# Patient Record
Sex: Female | Born: 1970 | Hispanic: Yes | Smoking: Never smoker
Health system: Southern US, Community
[De-identification: ages and names within clinical notes are randomized; demographics above are authoritative.]

## PROBLEM LIST (undated history)

## (undated) DIAGNOSIS — I10 Essential (primary) hypertension: Secondary | ICD-10-CM

## (undated) DIAGNOSIS — G4730 Sleep apnea, unspecified: Secondary | ICD-10-CM

## (undated) DIAGNOSIS — F419 Anxiety disorder, unspecified: Secondary | ICD-10-CM

## (undated) DIAGNOSIS — G43909 Migraine, unspecified, not intractable, without status migrainosus: Secondary | ICD-10-CM

## (undated) HISTORY — DX: Migraine, unspecified, not intractable, without status migrainosus: G43.909

## (undated) HISTORY — PX: TUBAL LIGATION: SHX77

---

## 2000-05-16 ENCOUNTER — Other Ambulatory Visit: Admission: RE | Admit: 2000-05-16 | Discharge: 2000-05-16 | Payer: Self-pay | Admitting: Obstetrics and Gynecology

## 2000-06-16 ENCOUNTER — Ambulatory Visit (HOSPITAL_COMMUNITY): Admission: RE | Admit: 2000-06-16 | Discharge: 2000-06-16 | Payer: Self-pay | Admitting: Obstetrics and Gynecology

## 2000-06-16 ENCOUNTER — Encounter: Payer: Self-pay | Admitting: Obstetrics and Gynecology

## 2000-11-09 ENCOUNTER — Inpatient Hospital Stay (HOSPITAL_COMMUNITY): Admission: AD | Admit: 2000-11-09 | Discharge: 2000-11-09 | Payer: Self-pay | Admitting: Obstetrics and Gynecology

## 2000-11-11 ENCOUNTER — Inpatient Hospital Stay (HOSPITAL_COMMUNITY): Admission: AD | Admit: 2000-11-11 | Discharge: 2000-11-13 | Payer: Self-pay | Admitting: Obstetrics and Gynecology

## 2001-12-24 ENCOUNTER — Other Ambulatory Visit: Admission: RE | Admit: 2001-12-24 | Discharge: 2001-12-24 | Payer: Self-pay | Admitting: Obstetrics and Gynecology

## 2002-02-08 ENCOUNTER — Encounter: Payer: Self-pay | Admitting: Obstetrics and Gynecology

## 2002-02-08 ENCOUNTER — Ambulatory Visit (HOSPITAL_COMMUNITY): Admission: RE | Admit: 2002-02-08 | Discharge: 2002-02-08 | Payer: Self-pay | Admitting: Obstetrics and Gynecology

## 2002-06-26 ENCOUNTER — Inpatient Hospital Stay (HOSPITAL_COMMUNITY): Admission: AD | Admit: 2002-06-26 | Discharge: 2002-06-26 | Payer: Self-pay | Admitting: Obstetrics and Gynecology

## 2002-07-03 ENCOUNTER — Inpatient Hospital Stay (HOSPITAL_COMMUNITY): Admission: AD | Admit: 2002-07-03 | Discharge: 2002-07-03 | Payer: Self-pay | Admitting: Obstetrics and Gynecology

## 2002-07-06 ENCOUNTER — Inpatient Hospital Stay (HOSPITAL_COMMUNITY): Admission: AD | Admit: 2002-07-06 | Discharge: 2002-07-07 | Payer: Self-pay | Admitting: Obstetrics and Gynecology

## 2006-06-19 ENCOUNTER — Emergency Department (HOSPITAL_COMMUNITY): Admission: EM | Admit: 2006-06-19 | Discharge: 2006-06-19 | Payer: Self-pay | Admitting: Emergency Medicine

## 2006-07-11 ENCOUNTER — Emergency Department (HOSPITAL_COMMUNITY): Admission: EM | Admit: 2006-07-11 | Discharge: 2006-07-11 | Payer: Self-pay | Admitting: Emergency Medicine

## 2007-05-18 ENCOUNTER — Emergency Department (HOSPITAL_COMMUNITY): Admission: EM | Admit: 2007-05-18 | Discharge: 2007-05-18 | Payer: Self-pay | Admitting: Emergency Medicine

## 2007-12-18 ENCOUNTER — Emergency Department (HOSPITAL_COMMUNITY): Admission: EM | Admit: 2007-12-18 | Discharge: 2007-12-18 | Payer: Self-pay | Admitting: Emergency Medicine

## 2008-05-08 ENCOUNTER — Emergency Department (HOSPITAL_COMMUNITY): Admission: EM | Admit: 2008-05-08 | Discharge: 2008-05-08 | Payer: Self-pay | Admitting: Emergency Medicine

## 2008-05-09 ENCOUNTER — Encounter (INDEPENDENT_AMBULATORY_CARE_PROVIDER_SITE_OTHER): Payer: Self-pay | Admitting: Emergency Medicine

## 2008-05-09 ENCOUNTER — Emergency Department (HOSPITAL_COMMUNITY): Admission: EM | Admit: 2008-05-09 | Discharge: 2008-05-09 | Payer: Self-pay | Admitting: Emergency Medicine

## 2008-05-19 ENCOUNTER — Emergency Department (HOSPITAL_COMMUNITY): Admission: EM | Admit: 2008-05-19 | Discharge: 2008-05-19 | Payer: Self-pay | Admitting: Emergency Medicine

## 2008-05-29 ENCOUNTER — Emergency Department (HOSPITAL_COMMUNITY): Admission: EM | Admit: 2008-05-29 | Discharge: 2008-05-29 | Payer: Self-pay | Admitting: Emergency Medicine

## 2008-12-21 ENCOUNTER — Inpatient Hospital Stay (HOSPITAL_COMMUNITY): Admission: AD | Admit: 2008-12-21 | Discharge: 2008-12-24 | Payer: Self-pay | Admitting: Psychiatry

## 2008-12-21 ENCOUNTER — Emergency Department (HOSPITAL_COMMUNITY): Admission: EM | Admit: 2008-12-21 | Discharge: 2008-12-21 | Payer: Self-pay | Admitting: Emergency Medicine

## 2008-12-21 ENCOUNTER — Ambulatory Visit: Payer: Self-pay | Admitting: Psychiatry

## 2009-01-14 ENCOUNTER — Emergency Department (HOSPITAL_COMMUNITY): Admission: EM | Admit: 2009-01-14 | Discharge: 2009-01-14 | Payer: Self-pay | Admitting: Emergency Medicine

## 2009-01-16 ENCOUNTER — Encounter: Admission: RE | Admit: 2009-01-16 | Discharge: 2009-01-16 | Payer: Self-pay | Admitting: Family Medicine

## 2009-07-23 ENCOUNTER — Emergency Department (HOSPITAL_COMMUNITY): Admission: EM | Admit: 2009-07-23 | Discharge: 2009-07-23 | Payer: Self-pay | Admitting: Emergency Medicine

## 2009-08-24 ENCOUNTER — Other Ambulatory Visit: Admission: RE | Admit: 2009-08-24 | Discharge: 2009-08-24 | Payer: Self-pay | Admitting: Obstetrics and Gynecology

## 2009-10-11 ENCOUNTER — Inpatient Hospital Stay (HOSPITAL_COMMUNITY): Admission: AD | Admit: 2009-10-11 | Discharge: 2009-10-14 | Payer: Self-pay | Admitting: Obstetrics and Gynecology

## 2009-10-11 ENCOUNTER — Ambulatory Visit: Payer: Self-pay | Admitting: Obstetrics & Gynecology

## 2010-01-03 HISTORY — PX: HERNIA REPAIR: SHX51

## 2010-01-25 LAB — CBC
MCH: 29.2 pg (ref 26.0–34.0)
MCV: 83.2 fL (ref 78.0–100.0)
Platelets: 276 10*3/uL (ref 150–400)
RBC: 3.7 MIL/uL — ABNORMAL LOW (ref 3.87–5.11)
RDW: 12.2 % (ref 11.5–15.5)

## 2010-01-25 LAB — RAPID URINE DRUG SCREEN, HOSP PERFORMED: Barbiturates: NOT DETECTED

## 2010-01-25 LAB — DIFFERENTIAL
Basophils Relative: 1 % (ref 0–1)
Eosinophils Absolute: 0.1 10*3/uL (ref 0.0–0.7)
Eosinophils Relative: 2 % (ref 0–5)
Lymphs Abs: 2.1 10*3/uL (ref 0.7–4.0)
Monocytes Relative: 6 % (ref 3–12)
Neutrophils Relative %: 58 % (ref 43–77)

## 2010-01-25 LAB — BASIC METABOLIC PANEL
CO2: 24 mEq/L (ref 19–32)
Chloride: 108 mEq/L (ref 96–112)
GFR calc Af Amer: >60 mL/min (ref >60)
Glucose, Bld: 125 mg/dL — ABNORMAL HIGH (ref 70–99)
Potassium: 3.8 mEq/L (ref 3.5–5.1)
Sodium: 141 mEq/L (ref 135–145)

## 2010-01-25 LAB — SURGICAL PCR SCREEN: MRSA, PCR: NEGATIVE

## 2010-01-26 ENCOUNTER — Ambulatory Visit (HOSPITAL_COMMUNITY)
Admission: RE | Admit: 2010-01-26 | Discharge: 2010-01-26 | Payer: Self-pay | Source: Home / Self Care | Attending: General Surgery | Admitting: General Surgery

## 2010-02-08 NOTE — Discharge Summary (Signed)
  NAMEJENIPHER, HAVEL               ACCOUNT NO.:  [id number]  MEDICAL RECORD NO.:  [id number]          PATIENT TYPE:  OIB  LOCATION:  A206                          FACILITY:  [facility]  PHYSICIAN:  Barbaraann Barthel, M.D. DATE OF BIRTH:  Jul 30, 1970  DATE OF ADMISSION:  01/26/2010 DATE OF DISCHARGE:  01/24/2012LH                              DISCHARGE SUMMARY   DIAGNOSIS:  Umbilical hernia.  PROCEDURE:  Umbilical hernia repair (without mesh).  NOTE:  This is a 40 year old Latin American female who was seen through the Outpatient Department and was operated upon for an umbilical hernia repair, this was uneventful.  This was taken care of without surgery. She was kept under observation postanesthesia wise, so she had no untoward symptoms.  At the time of discharge, her vital signs were stable.  Her wound was clean and dry.  She was voiding well and tolerating p.o.  She had no shortness of breath, leg pain, or any fever. We have made followup arrangements for discharge for her.     Barbaraann Barthel, M.D.     WB/MEDQ  D:  01/26/2010  T:  01/27/2010  Job:  [id number]  Electronically Signed by Barbaraann Barthel M.D. on 02/08/2010 10:53:41 AM

## 2010-02-08 NOTE — Op Note (Signed)
  Paula Mullins, Paula Mullins               ACCOUNT NO.:  [id number]  MEDICAL RECORD NO.:  [id number]          PATIENT TYPE:  OIB  LOCATION:  A206                          FACILITY:  [facility]  PHYSICIAN:  Barbaraann Barthel, M.D. DATE OF BIRTH:  12-Aug-1970  DATE OF PROCEDURE:  01/26/2010 DATE OF DISCHARGE:  01/26/2010                              OPERATIVE REPORT   PREOPERATIVE DIAGNOSIS:  Umbilical hernia.  POSTOPERATIVE DIAGNOSIS:  Umbilical hernia.  PROCEDURE:  Umbilical hernia repair (without mesh).  SPECIMEN:  None.  SURGEON:  Barbaraann Barthel, MD  WOUND CLASSIFICATION:  Clean.  NOTE:  This is a 41 year old Latin American female who is a multigravida who presented with an umbilical hernia.  We discussed elective repair with her and discussed complications not limited to, but including bleeding, infection, and recurrence.  We also discussed the possibility that I might need to use a mesh prosthesis.  This was discussed in detail in Spanish and an informed consent was obtained.  GROSS OPERATIVE FINDINGS:  The patient had defect approximately the size of a quarter.  I did not feel that this required any mesh, as there was a good fascial rim around the perimeter of this which allowed a closure without tension.  There was some incarcerated omentum which was returned to the intra-abdominal cavity and there was a very attenuated hernia sac, which was not sent as a specimen.  TECHNIQUE:  The patient was placed in the supine position.  After the adequate administration of general anesthesia via endotracheal intubation or LMA, the patient's abdomen was prepped with Betadine solution and draped in usual manner.  Prior to this, a Foley catheter was aseptically inserted.  A curvilinear incision was carried out over the inferior aspect of the umbilicus in order to obtain a good cosmetic result.  We dissected the hernia free from the umbilical skin carefully, so that this was not buttonholed.   We then obtained good fascial rim after returning the omentum to the abdominal cavity.  I closed this transversely using 0 Vicryl in a figure-of-eight fashion.  Required approximately four sutures to do this.  We then closed the subcutaneous layer over this and I anesthetized the area around the fascia with 0.5% Sensorcaine to help with postoperative comfort.  I then tacked the umbilicus skin to the fascia to return the umbilicus to its natural concave appearance.  I then closed the skin subcuticularly with a 5-0 Polysorb suture and quarter-inch Steri-Strips with bacitracin and a sterile dressing was applied.  Prior to closure, all sponge, needle, and instrument counts were found to be correct.  Estimated blood loss was minimal.  The patient received a liter of crystalloids intraoperatively.  No drains were placed and there were no complications.     Barbaraann Barthel, M.D.     WB/MEDQ  D:  01/26/2010  T:  01/27/2010  Job:  [id number]  cc:   Dr. Emelda Fear  Electronically Signed by Barbaraann Barthel M.D. on 02/08/2010 10:53:48 AM

## 2010-03-17 LAB — URINE MICROSCOPIC-ADD ON

## 2010-03-17 LAB — CBC
HCT: 18.6 % — ABNORMAL LOW (ref 36.0–46.0)
HCT: 25.5 % — ABNORMAL LOW (ref 36.0–46.0)
Hemoglobin: 10.2 g/dL — ABNORMAL LOW (ref 12.0–15.0)
Hemoglobin: 8.9 g/dL — ABNORMAL LOW (ref 12.0–15.0)
MCH: 33 pg (ref 26.0–34.0)
MCHC: 34.6 g/dL (ref 30.0–36.0)
MCV: 94.9 fL (ref 78.0–100.0)
Platelets: 105 10*3/uL — ABNORMAL LOW (ref 150–400)
RBC: 1.97 MIL/uL — ABNORMAL LOW (ref 3.87–5.11)
RBC: 2.69 MIL/uL — ABNORMAL LOW (ref 3.87–5.11)
RDW: 14.2 % (ref 11.5–15.5)
WBC: 5.8 10*3/uL (ref 4.0–10.5)
WBC: 8.1 10*3/uL (ref 4.0–10.5)
WBC: 8.6 10*3/uL (ref 4.0–10.5)

## 2010-03-17 LAB — CROSSMATCH: Antibody Screen: NEGATIVE

## 2010-03-17 LAB — URINALYSIS, ROUTINE W REFLEX MICROSCOPIC
Glucose, UA: NEGATIVE mg/dL
Ketones, ur: NEGATIVE mg/dL
Nitrite: NEGATIVE
Specific Gravity, Urine: <1.005 — ABNORMAL LOW (ref 1.005–1.030)
pH: 6 (ref 5.0–8.0)

## 2010-03-17 LAB — RAPID URINE DRUG SCREEN, HOSP PERFORMED
Amphetamines: NOT DETECTED
Opiates: NOT DETECTED
Tetrahydrocannabinol: NOT DETECTED

## 2010-03-17 LAB — RPR: RPR Ser Ql: NONREACTIVE

## 2010-03-20 LAB — DIFFERENTIAL
Basophils Absolute: 0 10*3/uL (ref 0.0–0.1)
Basophils Relative: 0 % (ref 0–1)
Eosinophils Absolute: 0.1 10*3/uL (ref 0.0–0.7)
Monocytes Relative: 5 % (ref 3–12)
Neutro Abs: 4.3 10*3/uL (ref 1.7–7.7)
Neutrophils Relative %: 73 % (ref 43–77)

## 2010-03-20 LAB — URINALYSIS, ROUTINE W REFLEX MICROSCOPIC
Bilirubin Urine: NEGATIVE
Glucose, UA: NEGATIVE mg/dL
Hgb urine dipstick: NEGATIVE
Ketones, ur: NEGATIVE mg/dL
Specific Gravity, Urine: <1.005 — ABNORMAL LOW (ref 1.005–1.030)
pH: 7 (ref 5.0–8.0)

## 2010-03-20 LAB — CBC
MCH: 31.9 pg (ref 26.0–34.0)
MCHC: 34.3 g/dL (ref 30.0–36.0)
Platelets: 161 10*3/uL (ref 150–400)
RDW: 13.9 % (ref 11.5–15.5)

## 2010-03-20 LAB — BASIC METABOLIC PANEL
BUN: 4 mg/dL — ABNORMAL LOW (ref 6–23)
CO2: 21 mEq/L (ref 19–32)
Calcium: 8.4 mg/dL (ref 8.4–10.5)
Creatinine, Ser: 0.72 mg/dL (ref 0.4–1.2)
GFR calc non Af Amer: >60 mL/min (ref >60)
Glucose, Bld: 124 mg/dL — ABNORMAL HIGH (ref 70–99)
Sodium: 135 mEq/L (ref 135–145)

## 2010-03-20 LAB — URINE MICROSCOPIC-ADD ON

## 2010-04-05 LAB — CBC
MCHC: 33.9 g/dL (ref 30.0–36.0)
RBC: 3.72 MIL/uL — ABNORMAL LOW (ref 3.87–5.11)
RDW: 13.4 % (ref 11.5–15.5)

## 2010-04-05 LAB — RAPID URINE DRUG SCREEN, HOSP PERFORMED
Benzodiazepines: NOT DETECTED
Cocaine: POSITIVE — AB
Opiates: NOT DETECTED

## 2010-04-05 LAB — ETHANOL: Alcohol, Ethyl (B): 129 mg/dL — ABNORMAL HIGH (ref 0–10)

## 2010-04-05 LAB — DIFFERENTIAL
Basophils Absolute: 0 10*3/uL (ref 0.0–0.1)
Basophils Relative: 1 % (ref 0–1)
Lymphocytes Relative: 30 % (ref 12–46)
Monocytes Relative: 7 % (ref 3–12)
Neutro Abs: 2.6 10*3/uL (ref 1.7–7.7)
Neutrophils Relative %: 61 % (ref 43–77)

## 2010-04-05 LAB — DRUGS OF ABUSE SCREEN W/O ALC, ROUTINE URINE
Amphetamine Screen, Ur: NEGATIVE
Barbiturate Quant, Ur: NEGATIVE
Benzodiazepines.: NEGATIVE
Phencyclidine (PCP): NEGATIVE

## 2010-04-05 LAB — BASIC METABOLIC PANEL
CO2: 25 mEq/L (ref 19–32)
Calcium: 8.3 mg/dL — ABNORMAL LOW (ref 8.4–10.5)
Creatinine, Ser: 0.99 mg/dL (ref 0.4–1.2)
GFR calc Af Amer: >60 mL/min (ref >60)
GFR calc non Af Amer: >60 mL/min (ref >60)
Sodium: 140 mEq/L (ref 135–145)

## 2010-04-13 LAB — CBC
HCT: 27 % — ABNORMAL LOW (ref 36.0–46.0)
HCT: 27.1 % — ABNORMAL LOW (ref 36.0–46.0)
Hemoglobin: 9.6 g/dL — ABNORMAL LOW (ref 12.0–15.0)
Hemoglobin: 9.8 g/dL — ABNORMAL LOW (ref 12.0–15.0)
MCHC: 35.7 g/dL (ref 30.0–36.0)
MCV: 88 fL (ref 78.0–100.0)
MCV: 88.7 fL (ref 78.0–100.0)
Platelets: 269 10*3/uL (ref 150–400)
RBC: 3.21 MIL/uL — ABNORMAL LOW (ref 3.87–5.11)
RBC: 3.84 MIL/uL — ABNORMAL LOW (ref 3.87–5.11)
RDW: 13.2 % (ref 11.5–15.5)
RDW: 13.5 % (ref 11.5–15.5)
WBC: 10.2 10*3/uL (ref 4.0–10.5)
WBC: 11.1 10*3/uL — ABNORMAL HIGH (ref 4.0–10.5)

## 2010-04-13 LAB — DIFFERENTIAL
Basophils Absolute: 0 10*3/uL (ref 0.0–0.1)
Basophils Relative: 1 % (ref 0–1)
Eosinophils Absolute: 0 10*3/uL (ref 0.0–0.7)
Eosinophils Relative: 0 % (ref 0–5)
Eosinophils Relative: 1 % (ref 0–5)
Lymphocytes Relative: 15 % (ref 12–46)
Lymphocytes Relative: 12 % (ref 12–46)
Lymphs Abs: 1.2 10*3/uL (ref 0.7–4.0)
Lymphs Abs: 1.2 10*3/uL (ref 0.7–4.0)
Monocytes Absolute: 0.4 10*3/uL (ref 0.1–1.0)
Monocytes Absolute: 0.6 10*3/uL (ref 0.1–1.0)
Monocytes Relative: 6 % (ref 3–12)
Monocytes Relative: 4 % (ref 3–12)
Monocytes Relative: 6 % (ref 3–12)
Neutro Abs: 8.3 10*3/uL — ABNORMAL HIGH (ref 1.7–7.7)
Neutro Abs: 9.3 10*3/uL — ABNORMAL HIGH (ref 1.7–7.7)
Neutrophils Relative %: 85 % — ABNORMAL HIGH (ref 43–77)

## 2010-04-13 LAB — URINE MICROSCOPIC-ADD ON

## 2010-04-13 LAB — URINALYSIS, ROUTINE W REFLEX MICROSCOPIC
Glucose, UA: NEGATIVE mg/dL
Glucose, UA: NEGATIVE mg/dL
Ketones, ur: NEGATIVE mg/dL
Leukocytes, UA: NEGATIVE
Nitrite: NEGATIVE
Protein, ur: NEGATIVE mg/dL
Specific Gravity, Urine: 1.02 (ref 1.005–1.030)
pH: 6.5 (ref 5.0–8.0)

## 2010-04-13 LAB — BASIC METABOLIC PANEL
Calcium: 10 mg/dL (ref 8.4–10.5)
Chloride: 106 mEq/L (ref 96–112)
Creatinine, Ser: 0.88 mg/dL (ref 0.4–1.2)
GFR calc Af Amer: >60 mL/min (ref >60)
GFR calc non Af Amer: >60 mL/min (ref >60)
Glucose, Bld: 96 mg/dL (ref 70–99)
Potassium: 3.5 mEq/L (ref 3.5–5.1)
Sodium: 141 mEq/L (ref 135–145)

## 2010-04-13 LAB — TYPE AND SCREEN: Antibody Screen: NEGATIVE

## 2010-04-13 LAB — ABO/RH: ABO/RH(D): O POS

## 2010-04-13 LAB — HCG, QUANTITATIVE, PREGNANCY: hCG, Beta Chain, Quant, S: 4024 m[IU]/mL — ABNORMAL HIGH (ref <5)
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
## 2010-05-21 NOTE — Discharge Summary (Signed)
Paula Mullins, Paula Mullins                           ACCOUNT NO.:  [id number]   MEDICAL RECORD NO.:  [id number]                   PATIENT TYPE:  INP   LOCATION:  9127                                 FACILITY:  [facility]   PHYSICIAN:  Huel Cote, M.D.              DATE OF BIRTH:  [birth year]   DATE OF ADMISSION:  07/06/2002  DATE OF DISCHARGE:  07/07/2002                                 DISCHARGE SUMMARY   DISCHARGE DIAGNOSES:  1. Term pregnancy at 39+ weeks, delivered.  2. Grand multiparous status.  3. Status post normal spontaneous vaginal delivery.   DISCHARGE MEDICATIONS:  1. Motrin 600 mg p.o. q. 6 hours p.r.n.  2. Percocet 1-2 tablets p.o. every 4 hours p.r.n.   FOLLOW UP:  The patient is to follow up in 6 weeks for her routine  postpartum examination.   HOSPITAL COURSE:  The patient is a 40 year old G10, P6-0-3-6, who was  admitted at [redacted] weeks gestation with contractions every 5-6 minutes. Cervix  was 80/1 in maternity admissions and changed to completely effaced, 4 cm,  with her own contractions. Prenatal care had been complicated by her grand  multiparous status and a history of gestational diabetes. The patient does  not desire permanent sterility.   PRENATAL LABORATORY DATA:  O positive, antibody negative, RPR negative,  Rubella immune. Hepatitis B surface antigen negative. HIV declined. GC  negative. Chlamydia negative. Group B strep negative. One hour Glucola 131.   PAST OBSTETRICAL HISTORY:  Significant for vaginal deliveries in 1988, 1990,  1993, 1996, 2000, and 2002, for a total of 6 vaginal deliveries. She also  has had 3 spontaneous miscarriages.   PAST GYNECOLOGIC HISTORY:  None.   PAST MEDICAL HISTORY:  History of gestational diabetes with a previous  pregnancy. None this pregnancy.   ALLERGIES:  No known drug allergies.   PHYSICAL EXAMINATION:  VITAL SIGNS: She was afebrile with stable vital  signs. Fetal heart rate was reactive.  ABDOMEN: She is gravid  and nontender.  PELVIC: On admission, cervix was completely effaced, 5-6 cm and -1 station  several hours after admission.   HOSPITAL COURSE:  She had rupture of membranes performed and quickly reached  complete dilation and pushed well with a normal spontaneous vaginal delivery  of a vigorous female infant delivered directly OP over an intact perineum.  Apgars were 8 and 9. Weight was 7 pounds 11 ounces. Placenta delivered  spontaneously. She had no  lacerations and no anesthesia. On postpartum day #1, the patient was doing  very well and the patient desired discharge. She had no complaints. She was  afebrile with stable vital signs. Her fundus was firm. Therefore, she was  cleared for discharge and will followup in the office as previously stated.  Huel Cote, M.D.    KR/MEDQ  D:  08/13/2002  T:  08/13/2002  Job:  [id number]

## 2010-10-19 LAB — CBC
HCT: 31.9 — ABNORMAL LOW
MCV: 85.9
RBC: 3.72 — ABNORMAL LOW
WBC: 9.3

## 2010-10-19 LAB — URINALYSIS, ROUTINE W REFLEX MICROSCOPIC
Glucose, UA: NEGATIVE
Hgb urine dipstick: NEGATIVE
Specific Gravity, Urine: 1.02
Urobilinogen, UA: 0.2

## 2010-10-19 LAB — COMPREHENSIVE METABOLIC PANEL
AST: 24
Alkaline Phosphatase: 43
CO2: 19
Chloride: 106
Creatinine, Ser: 0.96
GFR calc Af Amer: >60
GFR calc non Af Amer: >60
Potassium: 2.9 — ABNORMAL LOW
Total Bilirubin: 1.4 — ABNORMAL HIGH

## 2011-05-18 ENCOUNTER — Encounter (HOSPITAL_COMMUNITY): Payer: Self-pay | Admitting: *Deleted

## 2011-05-18 ENCOUNTER — Emergency Department (HOSPITAL_COMMUNITY)
Admission: EM | Admit: 2011-05-18 | Discharge: 2011-05-18 | Disposition: A | Discharge status: Home / Self Care | Payer: Self-pay | Attending: Emergency Medicine | Admitting: Emergency Medicine

## 2011-05-18 DIAGNOSIS — K0889 Other specified disorders of teeth and supporting structures: Secondary | ICD-10-CM

## 2011-05-18 DIAGNOSIS — K1370 Unspecified lesions of oral mucosa: Secondary | ICD-10-CM | POA: Insufficient documentation

## 2011-05-18 DIAGNOSIS — K089 Disorder of teeth and supporting structures, unspecified: Secondary | ICD-10-CM | POA: Insufficient documentation

## 2011-05-18 DIAGNOSIS — R609 Edema, unspecified: Secondary | ICD-10-CM | POA: Insufficient documentation

## 2011-05-18 DIAGNOSIS — K146 Glossodynia: Secondary | ICD-10-CM

## 2011-05-18 MED ORDER — HYDROCODONE-ACETAMINOPHEN 5-325 MG PO TABS: ORAL_TABLET | ORAL | Status: AC

## 2011-05-18 MED ORDER — PENICILLIN V POTASSIUM 500 MG PO TABS: 500.0000 mg | ORAL_TABLET | Freq: Three times a day (TID) | ORAL | Status: AC

## 2011-05-18 MED ORDER — MAGIC MOUTHWASH W/LIDOCAINE: 5.0000 mL | Freq: Four times a day (QID) | ORAL | Status: DC | PRN

## 2011-05-18 NOTE — ED Provider Notes (Signed)
History     CSN: [id number]  Arrival date & time 05/18/11  1533   First MD Initiated Contact with Patient 05/18/11 1613      Chief Complaint  Patient presents with  . sores in mouth     (Consider location/radiation/quality/duration/timing/severity/associated sxs/prior treatment) HPI Comments: Patient was prescribed metronidazole for BV on 05/09/2011 -- developed upper and lower extremity edema, without rash shortly after. She discontinued this medication per her doctor on 05/09. After d/c swelling resolved, but she developed lateral tongue sores which have ulcerated and are causing pain. No airway swelling, SOB. No eye redness or pain. No fever or skin rashes. Also patient has lower jaw molar irritation. Tylenol taken without relief. Orajel used without relief.   Patient is a 41 y.o. female presenting with mouth sores. The history is provided by the patient.  Mouth Lesions  The current episode started 5 to 7 days ago. The onset was gradual. The problem has been unchanged. The problem is moderate. The symptoms are relieved by nothing. The symptoms are aggravated by eating. Associated symptoms include mouth sores. Pertinent negatives include no fever, no eye itching, no abdominal pain, no diarrhea, no nausea, no vomiting, no congestion, no rhinorrhea, no sore throat, no swollen glands, no muscle aches, no cough, no wheezing, no rash, no eye discharge, no eye pain and no eye redness.    History reviewed. No pertinent past medical history.  History reviewed. No pertinent past surgical history.  No family history on file.  History  Substance Use Topics  . Smoking status: Never Smoker   . Smokeless tobacco: Not on file  . Alcohol Use: No    OB History    Grav Para Term Preterm Abortions TAB SAB Ect Mult Living                  Review of Systems  Constitutional: Negative for fever.  HENT: Positive for mouth sores. Negative for congestion, sore throat and rhinorrhea.   Eyes:  Negative for pain, discharge, redness and itching.  Respiratory: Negative for cough, shortness of breath and wheezing.   Cardiovascular: Negative for chest pain.  Gastrointestinal: Negative for nausea, vomiting, abdominal pain and diarrhea.  Musculoskeletal: Negative for myalgias.  Skin: Negative for color change and rash.  Neurological: Negative for syncope.  Hematological: Negative for adenopathy.    Allergies  Flagyl  Home Medications  No current outpatient prescriptions on file.  BP 116/49  Pulse 71  Temp(Src) 98.5 F (36.9 C) (Oral)  Resp 16  SpO2 98%  LMP 05/07/2011  Physical Exam  Nursing note and vitals reviewed. Constitutional: She appears well-developed and well-nourished.  HENT:  Head: Normocephalic and atraumatic. No trismus in the jaw.  Right Ear: External ear normal.  Left Ear: External ear normal.  Nose: Nose normal.  Mouth/Throat: Uvula is midline. Mucous membranes are not dry. Dental caries present. No uvula swelling. No oropharyngeal exudate, posterior oropharyngeal edema, posterior oropharyngeal erythema or tonsillar abscesses.    Eyes: Conjunctivae are normal. Right eye exhibits no discharge. Left eye exhibits no discharge.  Neck: Normal range of motion. Neck supple.  Cardiovascular: Normal rate, regular rhythm and normal heart sounds.   Pulmonary/Chest: Effort normal and breath sounds normal.  Abdominal: Soft. There is no tenderness.  Neurological: She is alert.  Skin: Skin is warm and dry. No rash noted.  Psychiatric: She has a normal mood and affect.    ED Course  Procedures (including critical care time)  Labs Reviewed - No data  to display No results found.   1. Tongue sore   2. Pain, dental     4:47 PM Patient seen and examined.    Vital signs reviewed and are as follows: Filed Vitals:   05/18/11 1544  BP: 116/49  Pulse: 71  Temp: 98.5 F (36.9 C)  Resp: 16   Will prescribe pain medicine, penicillin, magic mouthwash.    Counseled patient on signs and symptoms to return including airway swelling, worsening pain or swelling in mouth, bloodshot eyes, rash.   Patient counseled on use of narcotic pain medications. Counseled not to combine these medications with others containing tylenol. Urged not to drink alcohol, drive, or perform any other activities that requires focus while taking these medications. The patient verbalizes understanding and agrees with the plan.    MDM  Lateral tongue ulcerations -- history most suspicious for sequelae from recent medication reaction. Extremity swelling resolved. No evidence of Stevens-Johnson syndrome. Cannot rule-out soft tissue infection of tongue. Pt also has gum erythema and swelling from broken tooth.         Renne Crigler, [place] 05/19/11 1730

## 2011-05-18 NOTE — Discharge Instructions (Signed)
Please read and follow all provided instructions.  Your diagnoses today include:  1. Tongue sore   2. Pain, dental     Tests performed today include:  Vital signs. See below for your results today.   Medications prescribed:   Vicodin (hydrocodone/acetaminophen) - narcotic pain medication  You have been prescribed narcotic pain medication such as Vicodin, Percocet, or Ultram: DO NOT drive or perform any activities that require you to be awake and alert because this medicine can make you drowsy. BE VERY CAREFUL not to take multiple medicines containing Tylenol (also called acetaminophen). Doing so can lead to an overdose which can damage your liver and cause liver failure and possibly death.    Penicillin - antibiotic for dental/tongue infection  You have been prescribed an antibiotic medicine: take the entire course of medicine even if you are feeling better. Stopping early can cause the antibiotic not to work.   Magic mouthwash - for pain relief in mouth  Take any prescribed medications only as directed.  Home care instructions:  Follow any educational materials contained in this packet.  Avoid metronidazole (Flagyl) in future.   Follow-up instructions: Please follow-up with your dentist for further evaluation of your symptoms. If you do not have a dentist or primary care doctor -- see below for referral information.   See your doctor if symptoms not improving in 3 days  Return instructions:   Please return to the Emergency Department if you experience worsening symptoms.  Please return if you develop a fever, you develop more swelling in your face or neck, you have trouble breathing or swallowing food.  Return with skin rash, redness or pain  Return with blood shot eyes or eye irritation  Please return if you have any other emergent concerns.  Additional Information:  Your vital signs today were: BP 116/49  Pulse 71  Temp(Src) 98.5 F (36.9 C) (Oral)  Resp 16   SpO2 98%  LMP 05/07/2011 If your blood pressure (BP) was elevated above 135/85 this visit, please have this repeated by your doctor within one month. -------------- No Primary Care Doctor Call [facility]  [phone] Other agencies that provide inexpensive medical care    [facility]  [phone]    [facility]  [phone]    [facility]  [phone]    [facility]  [phone]    [facility]  [phone]    [facility]  [phone] -------------- RESOURCE GUIDE:  Dental Problems  Patients with Medicaid: [facility] [phone] [address]                                            [phone] [address] Phone:  [phone]                                                   Phone:  [phone]  If unable to pay or uninsured, contact:  [facility] or [facility]. to become qualified for the adult dental clinic.  Chronic Pain Problems Contact [facility]  [phone] Patients  need to be referred by their primary care doctor.  Insufficient Money for Medicine Contact [employer]:  call "211" or [facility] 540-250-6256.  Psychological Services [facility]  [phone] [facility]  [phone] [facility]   [phone] (emergency services [phone])  Substance Abuse Resources [facility]  312-621-7666 [facility] [phone] [facility] [phone] Floyd[facility] Flock [phone] Residential & Outpatient Substance Abuse Program  214-135-1807  Abuse/Neglect [facility] Child Abuse Hotline 810-783-1714 [facility] Child Abuse Hotline 203-358-6714 (After Hours)  Emergency Shelter [facility] 276-684-2057  Maternity Homes [facility] ([phone] [facility] (905)747-2216  [facility] Resources  [facility]  [place]     [employer]                          [facility] Dept. [address] [place]                       [address]      [address]  Blondell Reveal Phone:  [phone]                                   Phone:  [phone]                 Phone:  [phone]  [facility] Phone:  [phone]  [facility] Child Abuse Hotline (734) 223-4561 [phone])[phone] (After Hours)

## 2011-05-18 NOTE — ED Notes (Signed)
Placed on Flagyl for BV - subsequently developed ulcerations to left and rgt lateral aspects of tongue; no other complaints at this time

## 2011-05-18 NOTE — ED Notes (Signed)
Sores in her mouth for 3 days.  She had an allergic reaction to meds on the 9th and she thinks that caused the sores

## 2011-05-20 NOTE — ED Provider Notes (Signed)
Medical screening examination/treatment/procedure(s) were performed by non-physician practitioner and as supervising physician I was immediately available for consultation/collaboration.  Doug Sou, MD 05/20/11 [phone]

## 2012-02-03 ENCOUNTER — Emergency Department (HOSPITAL_COMMUNITY)
Admission: EM | Admit: 2012-02-03 | Discharge: 2012-02-03 | Disposition: A | Discharge status: Home / Self Care | Payer: Self-pay | Attending: Emergency Medicine | Admitting: Emergency Medicine

## 2012-02-03 ENCOUNTER — Encounter (HOSPITAL_COMMUNITY): Payer: Self-pay | Admitting: Emergency Medicine

## 2012-02-03 DIAGNOSIS — R059 Cough, unspecified: Secondary | ICD-10-CM | POA: Insufficient documentation

## 2012-02-03 DIAGNOSIS — R509 Fever, unspecified: Secondary | ICD-10-CM | POA: Insufficient documentation

## 2012-02-03 DIAGNOSIS — R05 Cough: Secondary | ICD-10-CM | POA: Insufficient documentation

## 2012-02-03 DIAGNOSIS — J020 Streptococcal pharyngitis: Secondary | ICD-10-CM | POA: Insufficient documentation

## 2012-02-03 DIAGNOSIS — R110 Nausea: Secondary | ICD-10-CM | POA: Insufficient documentation

## 2012-02-03 LAB — RAPID STREP SCREEN (MED CTR MEBANE ONLY): Streptococcus, Group A Screen (Direct): POSITIVE — AB

## 2012-02-03 MED ORDER — PREDNISONE 20 MG PO TABS: 40.0000 mg | ORAL_TABLET | Freq: Every day | ORAL | Status: DC

## 2012-02-03 MED ORDER — AMOXICILLIN 500 MG PO CAPS: 500.0000 mg | ORAL_CAPSULE | Freq: Three times a day (TID) | ORAL | Status: DC

## 2012-02-03 NOTE — ED Notes (Signed)
C/o sore throat x 2 days.

## 2012-02-03 NOTE — ED Notes (Signed)
Pt states cough started about 3 days ago and now has sore throat and noticed white speckles in back of throat starting today; pt denies fever; pt denies n/v/d. Pt denies numbness/tingling; pt denies lightheadedness/dizziness. Pt alert and mentating appropriately.

## 2012-02-03 NOTE — ED Notes (Signed)
Pt given d/c teaching and prescriptions; Pt verbalized understanding of d/c teaching and prescriptions; pt has no further questions upon d/c. Pt does not appear to be in acute distress upon d/c. Pt leaving with family.

## 2012-02-03 NOTE — ED Provider Notes (Signed)
History  This chart was scribed for non-physician practitioner working with Paula Lennert, MD by Paula Mullins, ED Scribe. This patient was seen in room TR07C/TR07C and the patient's care was started at 1942.  CSN: [id number]  Arrival date & time 02/03/12  [id number]   First MD Initiated Contact with Patient 02/03/12 1942      Chief Complaint  Patient presents with  . Sore Throat     The history is provided by the patient. No language interpreter was used.    Paula Mullins is a 42 y.o. female who presents to the Emergency Department complaining of a sore throat that began 2 days ago and has been gradually worsening. She is c/o associated cough, fever and nausea. She states the pain is aggravated by swallowing. She denies any emesis, diarrhea, difficulty swallowing or controlling  secretions as associated symptoms. It is severe at 10/10, exacerbated by swallowing   History reviewed. No pertinent past medical history.  History reviewed. No pertinent past surgical history.  No family history on file.  History  Substance Use Topics  . Smoking status: Never Smoker   . Smokeless tobacco: Not on file  . Alcohol Use: No   No OB history available.   Review of Systems  Constitutional: Negative for fever.  HENT: Positive for sore throat.   Respiratory: Positive for cough. Negative for shortness of breath.   Cardiovascular: Negative for chest pain.  Gastrointestinal: Negative for nausea, vomiting, abdominal pain and diarrhea.  All other systems reviewed and are negative.    Allergies  Flagyl  Home Medications  No current outpatient prescriptions on file.  Triage Vitals: BP 134/85  Pulse 88  Temp 98.2 F (36.8 C) (Oral)  Resp 20  SpO2 99%  LMP 01/20/2012  Physical Exam  Nursing note and vitals reviewed. Constitutional: She is oriented to person, place, and time. She appears well-developed and well-nourished. No distress.  HENT:  Head: Normocephalic and atraumatic.   Mouth/Throat: Oropharyngeal exudate present.       Tonsils swollen   Eyes: Conjunctivae normal and EOM are normal.  Neck:       Extremely tender anterior cervical lymphadenopathy.  Cardiovascular: Normal rate, regular rhythm, normal heart sounds and intact distal pulses.   Pulmonary/Chest: Effort normal and breath sounds normal. No stridor. No respiratory distress. She has no wheezes. She has no rales. She exhibits no tenderness.  Abdominal: Soft. Bowel sounds are normal. She exhibits no distension and no mass. There is no tenderness. There is no rebound and no guarding.  Musculoskeletal: Normal range of motion.  Lymphadenopathy:    She has cervical adenopathy.  Neurological: She is alert and oriented to person, place, and time.  Psychiatric: She has a normal mood and affect.    ED Course  Procedures (including critical care time)  DIAGNOSTIC STUDIES: Oxygen Saturation is 99% on room air, normal  by my interpretation.    COORDINATION OF CARE:  8:37 PM: Discussed treatment plan which includes rapid strep screen and antibiotics with pt at bedside and pt agreed to plan.   Results for orders placed during the hospital encounter of 02/03/12  RAPID STREP SCREEN      Component Value Range   Streptococcus, Group A Screen (Direct) POSITIVE (*) NEGATIVE     No results found.   1. Strep pharyngitis       MDM    Pt verbalized understanding and agrees with care plan. Outpatient follow-up and return precautions given.    New Prescriptions  AMOXICILLIN (AMOXIL) 500 MG CAPSULE    Take 1 capsule (500 mg total) by mouth 3 (three) times daily.   PREDNISONE (DELTASONE) 20 MG TABLET    Take 2 tablets (40 mg total) by mouth daily.    I personally performed the services described in this documentation, which was scribed in my presence. The recorded information has been reviewed and is accurate.   Paula Emery, PA-C 02/03/12 [phone]

## 2012-02-06 NOTE — ED Provider Notes (Signed)
Medical screening examination/treatment/procedure(s) were performed by non-physician practitioner and as supervising physician I was immediately available for consultation/collaboration.   [name], MD 02/06/12 1447

## 2012-03-13 ENCOUNTER — Other Ambulatory Visit: Payer: Self-pay | Admitting: Obstetrics and Gynecology

## 2012-03-13 DIAGNOSIS — Z1231 Encounter for screening mammogram for malignant neoplasm of breast: Secondary | ICD-10-CM

## 2012-03-21 ENCOUNTER — Ambulatory Visit (HOSPITAL_COMMUNITY): Payer: Self-pay | Attending: Obstetrics and Gynecology

## 2012-07-05 ENCOUNTER — Encounter (HOSPITAL_COMMUNITY): Payer: Self-pay | Admitting: *Deleted

## 2012-07-05 ENCOUNTER — Emergency Department (HOSPITAL_COMMUNITY)
Admission: EM | Admit: 2012-07-05 | Discharge: 2012-07-05 | Disposition: A | Discharge status: Other Acute Inpatient Hospital | Payer: Self-pay | Attending: Emergency Medicine | Admitting: Emergency Medicine

## 2012-07-05 ENCOUNTER — Inpatient Hospital Stay (HOSPITAL_COMMUNITY)
Admission: AD | Admit: 2012-07-05 | Discharge: 2012-07-10 | DRG: 897 | Disposition: A | Discharge status: Home / Self Care | Payer: No Typology Code available for payment source | Source: Intra-hospital | Attending: Psychiatry | Admitting: Psychiatry

## 2012-07-05 DIAGNOSIS — F1420 Cocaine dependence, uncomplicated: Secondary | ICD-10-CM | POA: Diagnosis present

## 2012-07-05 DIAGNOSIS — F1410 Cocaine abuse, uncomplicated: Secondary | ICD-10-CM | POA: Insufficient documentation

## 2012-07-05 DIAGNOSIS — F411 Generalized anxiety disorder: Secondary | ICD-10-CM | POA: Diagnosis present

## 2012-07-05 DIAGNOSIS — F332 Major depressive disorder, recurrent severe without psychotic features: Secondary | ICD-10-CM | POA: Diagnosis present

## 2012-07-05 DIAGNOSIS — F1424 Cocaine dependence with cocaine-induced mood disorder: Secondary | ICD-10-CM

## 2012-07-05 DIAGNOSIS — Z008 Encounter for other general examination: Secondary | ICD-10-CM | POA: Insufficient documentation

## 2012-07-05 DIAGNOSIS — F1994 Other psychoactive substance use, unspecified with psychoactive substance-induced mood disorder: Principal | ICD-10-CM | POA: Diagnosis present

## 2012-07-05 DIAGNOSIS — Z5987 Material hardship due to limited financial resources, not elsewhere classified: Secondary | ICD-10-CM

## 2012-07-05 DIAGNOSIS — Z3202 Encounter for pregnancy test, result negative: Secondary | ICD-10-CM | POA: Insufficient documentation

## 2012-07-05 DIAGNOSIS — Z598 Other problems related to housing and economic circumstances: Secondary | ICD-10-CM

## 2012-07-05 DIAGNOSIS — G4700 Insomnia, unspecified: Secondary | ICD-10-CM | POA: Diagnosis present

## 2012-07-05 DIAGNOSIS — R45851 Suicidal ideations: Secondary | ICD-10-CM | POA: Insufficient documentation

## 2012-07-05 HISTORY — DX: Sleep apnea, unspecified: G47.30

## 2012-07-05 HISTORY — DX: Anxiety disorder, unspecified: F41.9

## 2012-07-05 LAB — COMPREHENSIVE METABOLIC PANEL
ALT: 13 U/L (ref 0–35)
AST: 12 U/L (ref 0–37)
Albumin: 3.9 g/dL (ref 3.5–5.2)
Alkaline Phosphatase: 45 U/L (ref 39–117)
Calcium: 9.5 mg/dL (ref 8.4–10.5)
Potassium: 3.1 mEq/L — ABNORMAL LOW (ref 3.5–5.1)
Sodium: 138 mEq/L (ref 135–145)
Total Protein: 7 g/dL (ref 6.0–8.3)

## 2012-07-05 LAB — URINALYSIS, ROUTINE W REFLEX MICROSCOPIC
Bilirubin Urine: NEGATIVE
Glucose, UA: NEGATIVE mg/dL
Hgb urine dipstick: NEGATIVE
Nitrite: NEGATIVE
Specific Gravity, Urine: 1.007 (ref 1.005–1.030)
pH: 6.5 (ref 5.0–8.0)

## 2012-07-05 LAB — CBC
MCH: 29.8 pg (ref 26.0–34.0)
MCHC: 34 g/dL (ref 30.0–36.0)
Platelets: 233 10*3/uL (ref 150–400)
RBC: 3.83 MIL/uL — ABNORMAL LOW (ref 3.87–5.11)

## 2012-07-05 LAB — RAPID URINE DRUG SCREEN, HOSP PERFORMED
Amphetamines: NOT DETECTED
Benzodiazepines: NOT DETECTED
Cocaine: POSITIVE — AB
Opiates: NOT DETECTED
Tetrahydrocannabinol: NOT DETECTED

## 2012-07-05 MED ORDER — ALUM & MAG HYDROXIDE-SIMETH 200-200-20 MG/5ML PO SUSP: 30.0000 mL | ORAL | Status: DC | PRN

## 2012-07-05 MED ORDER — LORAZEPAM 1 MG PO TABS: 1.0000 mg | ORAL_TABLET | Freq: Three times a day (TID) | ORAL | Status: DC | PRN

## 2012-07-05 MED ORDER — ONDANSETRON HCL 4 MG PO TABS: 4.0000 mg | ORAL_TABLET | Freq: Three times a day (TID) | ORAL | Status: DC | PRN

## 2012-07-05 MED ORDER — ESCITALOPRAM OXALATE 5 MG PO TABS
5.0000 mg | ORAL_TABLET | Freq: Every day | ORAL | Status: DC
  Administered 2012-07-05 – 2012-07-06 (×2): 5 mg via ORAL
  Filled 2012-07-05 (×4): qty 1

## 2012-07-05 MED ORDER — ACETAMINOPHEN 325 MG PO TABS: 650.0000 mg | ORAL_TABLET | Freq: Four times a day (QID) | ORAL | Status: DC | PRN

## 2012-07-05 MED ORDER — NICOTINE 21 MG/24HR TD PT24: 21.0000 mg | MEDICATED_PATCH | Freq: Every day | TRANSDERMAL | Status: DC

## 2012-07-05 MED ORDER — IBUPROFEN 200 MG PO TABS: 600.0000 mg | ORAL_TABLET | Freq: Three times a day (TID) | ORAL | Status: DC | PRN

## 2012-07-05 MED ORDER — ZOLPIDEM TARTRATE 5 MG PO TABS: 5.0000 mg | ORAL_TABLET | Freq: Every evening | ORAL | Status: DC | PRN

## 2012-07-05 MED ORDER — TRAZODONE HCL 50 MG PO TABS
50.0000 mg | ORAL_TABLET | Freq: Every evening | ORAL | Status: DC | PRN
  Administered 2012-07-07 – 2012-07-09 (×3): 50 mg via ORAL
  Filled 2012-07-05: qty 14
  Filled 2012-07-05 (×2): qty 1

## 2012-07-05 MED ORDER — HYDROXYZINE HCL 25 MG PO TABS
25.0000 mg | ORAL_TABLET | Freq: Four times a day (QID) | ORAL | Status: DC | PRN
  Administered 2012-07-07 – 2012-07-08 (×2): 25 mg via ORAL
  Filled 2012-07-05: qty 30

## 2012-07-05 MED ORDER — POTASSIUM CHLORIDE CRYS ER 20 MEQ PO TBCR
20.0000 meq | EXTENDED_RELEASE_TABLET | Freq: Two times a day (BID) | ORAL | Status: AC
  Administered 2012-07-05 – 2012-07-08 (×6): 20 meq via ORAL
  Filled 2012-07-05 (×6): qty 1

## 2012-07-05 MED ORDER — MAGNESIUM HYDROXIDE 400 MG/5ML PO SUSP: 30.0000 mL | Freq: Every day | ORAL | Status: DC | PRN

## 2012-07-05 NOTE — ED Notes (Signed)
Pt transported via security to [facility].

## 2012-07-05 NOTE — BH Assessment (Signed)
Assessment Note   Paula Mullins is an 42 y.o. female who presents with passive suicidal ideation.  She initially stated no having a specific plan, but stated that tonight she debated between jumping in front of a bus and coming for help.  She states she is overwhelmed by life because she is living with her daughter and her daughter is taking care of her.  She reports that she goes on binges and disappears for 24 hours at a time and that she's using $100-150 dollars of crack daily.  She endorses worsening depression and hopelessness.  She denies HI and AVH.  She has one previous attempt in 2009 in which she slit her wrists and was hospitalized at [facility] then did outpatient treatment at [facility] until 2012.  She states she is hardly sleeping at night, but sleeping all day, she has lost 10-15 lbs, suffers from decreased concentration and memory loss.  She was evaluated by telemedicine and inpatient treatment was reccommended.    Axis I: Substance Abuse, Substance Induced Mood Disorder and Cocaine Abuse Axis II: Deferred Axis III: History reviewed. No pertinent past medical history. Axis IV: economic problems, housing problems and problems with access to health care services Axis V: 41-50 serious symptoms  Past Medical History: History reviewed. No pertinent past medical history.  Past Surgical History  Procedure Laterality Date  . Cesarean section      Family History: No family history on file.  Social History:  reports that she has never smoked. She does not have any smokeless tobacco history on file. She reports that she uses illicit drugs (Cocaine). She reports that she does not drink alcohol.  Additional Social History:  Alcohol / Drug Use History of alcohol / drug use?: Yes Negative Consequences of Use: Personal relationships;Financial Substance #1 Name of Substance 1: Crack cocaine 1 - Age of First Use: 33 1 - Amount (size/oz): $100-150 1 - Frequency: 2-3 x weekly 1 - Duration: 2  mos 1 - Last Use / Amount: 07/05/12 0000  CIWA: CIWA-Ar BP: 131/74 mmHg Pulse Rate: 75 COWS:    Allergies:  Allergies  Allergen Reactions  . Flagyl (Metronidazole) Swelling    Home Medications:  (Not in a hospital admission)  OB/GYN Status:  Patient's last menstrual period was 07/03/2012.  General Assessment Data Location of Assessment: [facility] ED Living Arrangements: Children;Other relatives (24 you daughter, 61 yo grandson, 32 and 12 yo sons) Can pt return to current living arrangement?: Yes Admission Status: Voluntary Is patient capable of signing voluntary admission?: Yes Transfer from: Acute Hospital Referral Source: Self/Family/Friend  Education Status Is patient currently in school?: No Highest grade of school patient has completed: 10  Risk to self Suicidal Ideation: Yes-Currently Present Suicidal Intent: No-Not Currently/Within Last 6 Months Is patient at risk for suicide?: Yes Suicidal Plan?: Yes-Currently Present Specify Current Suicidal Plan: thought of jumping in front of a bus Access to Means: Yes Specify Access to Suicidal Means: environmental What has been your use of drugs/alcohol within the last 12 months?: ongoing crack use x 2 mos Previous Attempts/Gestures: Yes How many times?: 1 (cut wrists in 2009) Triggers for Past Attempts: Other personal contacts Intentional Self Injurious Behavior: None Family Suicide History: No Recent stressful life event(s): Job Loss;Other (Comment);Financial Problems Persecutory voices/beliefs?: No Depression: Yes Depression Symptoms: Despondent;Insomnia;Tearfulness;Isolating;Fatigue;Guilt;Loss of interest in usual pleasures;Feeling worthless/self pity Substance abuse history and/or treatment for substance abuse?: Yes Suicide prevention information given to non-admitted patients: Yes  Risk to Others Homicidal Ideation: No Thoughts of  Harm to Others: No Current Homicidal Intent: No Current Homicidal Plan: No Access to  Homicidal Means: No History of harm to others?: No Assessment of Violence: None Noted Does patient have access to weapons?: No Criminal Charges Pending?: No Does patient have a court date: No  Psychosis Hallucinations: None noted Delusions: None noted  Mental Status Report Appear/Hygiene: Disheveled Eye Contact: Fair Motor Activity: Freedom of movement Speech: Soft Level of Consciousness: Quiet/awake Mood: Depressed;Anxious Affect: Depressed Anxiety Level: Moderate Thought Processes: Relevant;Coherent Judgement: Unimpaired Orientation: Person;Place;Time;Situation Obsessive Compulsive Thoughts/Behaviors: Moderate  Cognitive Functioning Concentration: Decreased Memory: Recent Impaired;Remote Intact IQ: Average Insight: Fair Impulse Control: Poor Appetite: Poor Weight Loss: 15 Weight Gain: 0 Sleep: Decreased Total Hours of Sleep: 5 Vegetative Symptoms: Staying in bed;Decreased grooming  ADLScreening [facility] Assessment Services) Patient's cognitive ability adequate to safely complete daily activities?: Yes Patient able to express need for assistance with ADLs?: Yes Independently performs ADLs?: Yes (appropriate for developmental age)  Abuse/Neglect [facility]) Physical Abuse: Denies Verbal Abuse: Denies Sexual Abuse: Denies  Prior Inpatient Therapy Prior Inpatient Therapy: Yes Prior Therapy Dates: 2009 Prior Therapy Facilty/Provider(s): [facility] Reason for Treatment: suicide attempt  Prior Outpatient Therapy Prior Outpatient Therapy: Yes Prior Therapy Dates: 2009-2012 Prior Therapy Facilty[facility]Provider(s): [facility] Reason for Treatment: depression  ADL Screening (condition at time of admission) Patient's cognitive ability adequate to safely complete daily activities?: Yes Patient able to express need for assistance with ADLs?: Yes Independently performs ADLs?: Yes (appropriate for developmental age)       Abuse/Neglect Assessment (Assessment to be complete while patient  is alone) Physical Abuse: Denies Verbal Abuse: Denies Sexual Abuse: Denies Exploitation of patient/patient's resources: Denies Values / Beliefs Cultural Requests During Hospitalization: None Spiritual Requests During Hospitalization: None   Advance Directives (For Healthcare) Advance Directive: Patient does not have advance directive;Patient would not like information    Additional Information 1:1 In Past 12 Months?: No CIRT Risk: No Elopement Risk: No Does patient have medical clearance?: Yes     Disposition:  Disposition Initial Assessment Completed for this Encounter: Yes Disposition of Patient: Inpatient treatment program Type of inpatient treatment program: Adult  On Site Evaluation by:  Dammen Reviewed with Physician:     Steward Ros 07/05/2012 7:16 AM

## 2012-07-05 NOTE — ED Notes (Signed)
Patient states she has been abusing crack cocaine x 2 months and needs help getting clean, patient states she has random thoughts of suicide due to feeling "lost" in her addiction and unable to help her kids

## 2012-07-05 NOTE — ED Notes (Signed)
Pt has a visitor; visitor wanded by security

## 2012-07-05 NOTE — Progress Notes (Signed)
Pt admitted voluntary with si thoughts to walk into traffic and pt wants help with substance abuse of crack cocaine. Pt has a hx of coming to [facility] in 2009 for cutting and si thoughts. She has 8 children ages 2-25. Pt lives with her boyfriend that introduced her to cocaine and used to sell it. He is using also. She has legal charges and is on probation for trying to sell a gun that belonged to her boyfriend's mother. Pt has been married and is separated from her husband. Some on her children are living in [place]. Pt relapsed with crack after being clean 1 1/2 yrs. Stressors included losing her job that she worked at with her boyfriend. She reports that they were using her boyfriend mother's car to get to work and had car trouble with no transportation to work since they live in [place] and the job was in [employer]. Pt contracts for safety on admission.

## 2012-07-05 NOTE — ED Notes (Signed)
Pt at nurses' station using the telephone

## 2012-07-05 NOTE — H&P (Signed)
Psychiatric Admission Assessment Adult  Patient Identification:  Paula Mullins Date of Evaluation:  07/05/2012 Chief Complaint:  SUBINDUCED MOOD DISORDER, COCAINE ABUSE History of Present Illness:  Paula Mullins is an 42 y.o. female who presents with passive suicidal ideation. She initially stated no having a specific plan, but stated that tonight she debated between jumping in front of a bus and coming for help. She states she is overwhelmed by life because she is living with her daughter and her daughter is taking care of her. She reports that she goes on binges and disappears for 24 hours at a time and that she's using $100-150 dollars of crack daily. She endorses worsening depression and hopelessness. She denies HI and AVH. She has one previous attempt in 2009 in which she slit her wrists and was hospitalized at [facility] then did outpatient treatment at [facility] until 2012. She states she is hardly sleeping at night, but sleeping all day, she has lost 10-15 lbs, suffers from decreased concentration and memory loss.   On arrival to the unit the patient appears severely depressed and discheveled. She makes poor eye contact and speaks in a very soft voice. Patient reports relapsing on cocaine in May of this year after losing job and then feeling very depressed. [name] reports being clean from cocaine since 2012 when her last child was born but stated "He had traces of cocaine in his blood." Patient denies cocaine every day and last use was "midnight yesterday." The patient has not been taking any prescribed medicine before coming to the hospital. She reports that her husband lives in [place] with four of her children and that "I must pay support to him. I don't have a car to get to work so there is financial strain." Patient was having thoughts to step in front of a bus as she feels so hopeless about her situation. Today she rates her anxiety at eight and states her depression is "Way over a ten." Patient  becomes very tearful when talking about her stressors. [name] states "I am so messed up from drugs. I want to stop using them but I don' know how. I just keep doing them."    Elements:  Location:  [facility] in-patient. Quality:  Increasing depression, recent relapse on cocaine. Severity:  Patient has suicidal intent. . Timing:  Last few months.. Duration:  Chronic problem for many years. . Context:  family stress, cocaine abuse, unemployment, depression. Associated Signs/Synptoms: Depression Symptoms:  insomnia, fatigue, feelings of worthlessness/guilt, hopelessness, suicidal thoughts with specific plan, anxiety, panic attacks, weight loss, decreased appetite, (Hypo) Manic Symptoms:  Denies Anxiety Symptoms:  Excessive Worry, Psychotic Symptoms:  Denies PTSD Symptoms: Denies  Psychiatric Specialty Exam: Physical Exam-Findings from the ED reviewed  Review of Systems  Constitutional: Negative.   HENT: Negative.   Eyes: Negative.   Respiratory: Negative.   Cardiovascular: Negative.   Gastrointestinal: Negative.   Genitourinary: Negative.   Musculoskeletal: Negative.   Skin: Negative.   Neurological: Negative.   Endo/Heme/Allergies: Negative.   Psychiatric/Behavioral: Positive for depression, suicidal ideas and substance abuse. Negative for hallucinations and memory loss. The patient is nervous/anxious and has insomnia.     Blood pressure 115/75, pulse 62, temperature 97.9 F (36.6 C), temperature source Oral, resp. rate 16, height 5\' 3"  (1.6 m), weight 63.504 kg (140 lb), last menstrual period 07/03/2012.Body mass index is 24.81 kg/(m^2).  General Appearance: Disheveled and Fairly Groomed  Patent attorney::  Fair  Speech:  Clear and Coherent  Volume:  Decreased  Mood:  Depressed and Dysphoric  Affect:  Flat and Tearful  Thought Process:  Goal Directed  Orientation:  Full (Time, Place, and Person)  Thought Content:  WDL  Suicidal Thoughts:  Yes.  with intent/plan  Homicidal  Thoughts:  No  Memory:  Immediate;   Fair Recent;   Good Remote;   Good  Judgement:  Impaired  Insight:  Shallow  Psychomotor Activity:  Normal  Concentration:  Fair  Recall:  Fair  Akathisia:  No  Handed:  Right  AIMS (if indicated):     Assets:  Communication Skills Desire for Improvement Leisure Time Resilience Social Support  Sleep:       Past Psychiatric History: Yes Diagnosis:Substance Abuse, Depression  Hospitalizations: [facility] several years ago  Outpatient Care: Denies  Substance Abuse Care: [facility] in the past for cocaine abuse  Self-Mutilation:Denies  Suicidal Attempts: One prior attempt of cutting wrist in 2009.   Violent Behaviors: Denies   Past Medical History:   Past Medical History  Diagnosis Date  . Sleep apnea   . Anxiety     Pt reports anxiety   None. Allergies:   Allergies  Allergen Reactions  . Flagyl (Metronidazole) Swelling   PTA Medications: No prescriptions prior to admission    Previous Psychotropic Medications:  Medication/Dose  Seroquel               Substance Abuse History in the last 12 months:  yes  Consequences of Substance Abuse: Family Consequences:  Patient is enstranged from several of her children.   Social History:  reports that she has never smoked. She does not have any smokeless tobacco history on file. She reports that  drinks alcohol. She reports that she uses illicit drugs (Cocaine). Additional Social History: History of alcohol / drug use?: Yes Longest period of sobriety (when/how long): 1 1/2 years Negative Consequences of Use: Financial;Legal;Personal relationships;Work / Science writer Symptoms:  (headache/anxiety)                    Current Place of Residence:   Place of Birth:   Family Members: Marital Status:  Separated Children:  Sons:  Daughters: Relationships: Education:  Went through the 10th grade Educational Problems/Performance: Religious Beliefs/Practices: History of  Abuse (Emotional/Phsycial/Sexual) Teacher, music History:  None. Legal History: Hobbies/Interests:  Family History:  History reviewed. No pertinent family history.  Results for orders placed during the hospital encounter of 07/05/12 (from the past 72 hour(s))  CBC     Status: Abnormal   Collection Time    07/05/12  3:27 AM      Result Value Range   WBC 6.3  4.0 - 10.5 K/uL   RBC 3.83 (*) 3.87 - 5.11 MIL/uL   Hemoglobin 11.4 (*) 12.0 - 15.0 g/dL   HCT 16.1 (*) 09.6 - 04.5 %   MCV 87.5  78.0 - 100.0 fL   MCH 29.8  26.0 - 34.0 pg   MCHC 34.0  30.0 - 36.0 g/dL   RDW 40.9  81.1 - 91.4 %   Platelets 233  150 - 400 K/uL  COMPREHENSIVE METABOLIC PANEL     Status: Abnormal   Collection Time    07/05/12  3:27 AM      Result Value Range   Sodium 138  135 - 145 mEq/L   Potassium 3.1 (*) 3.5 - 5.1 mEq/L   Chloride 103  96 - 112 mEq/L   CO2 24  19 - 32 mEq/L   Glucose, Bld  113 (*) 70 - 99 mg/dL   BUN 12  6 - 23 mg/dL   Creatinine, Ser 1.61  0.50 - 1.10 mg/dL   Calcium 9.5  8.4 - 09.6 mg/dL   Total Protein 7.0  6.0 - 8.3 g/dL   Albumin 3.9  3.5 - 5.2 g/dL   AST 12  0 - 37 U/L   ALT 13  0 - 35 U/L   Alkaline Phosphatase 45  39 - 117 U/L   Total Bilirubin 1.3 (*) 0.3 - 1.2 mg/dL   GFR calc non Af Amer 66 (*) >90 mL/min   GFR calc Af Amer 76 (*) >90 mL/min   Comment:            The eGFR has been calculated     using the CKD EPI equation.     This calculation has not been     validated in all clinical     situations.     eGFR's persistently     <90 mL/min signify     possible Chronic Kidney Disease.  ETHANOL     Status: None   Collection Time    07/05/12  3:27 AM      Result Value Range   Alcohol, Ethyl (B) <11  0 - 11 mg/dL   Comment:            LOWEST DETECTABLE LIMIT FOR     SERUM ALCOHOL IS 11 mg/dL     FOR MEDICAL PURPOSES ONLY  URINALYSIS, ROUTINE W REFLEX MICROSCOPIC     Status: None   Collection Time    07/05/12  4:16 AM      Result Value Range    Color, Urine YELLOW  YELLOW   APPearance CLEAR  CLEAR   Specific Gravity, Urine 1.007  1.005 - 1.030   pH 6.5  5.0 - 8.0   Glucose, UA NEGATIVE  NEGATIVE mg/dL   Hgb urine dipstick NEGATIVE  NEGATIVE   Bilirubin Urine NEGATIVE  NEGATIVE   Ketones, ur NEGATIVE  NEGATIVE mg/dL   Protein, ur NEGATIVE  NEGATIVE mg/dL   Urobilinogen, UA 0.2  0.0 - 1.0 mg/dL   Nitrite NEGATIVE  NEGATIVE   Leukocytes, UA NEGATIVE  NEGATIVE   Comment: MICROSCOPIC NOT DONE ON URINES WITH NEGATIVE PROTEIN, BLOOD, LEUKOCYTES, NITRITE, OR GLUCOSE <1000 mg/dL.  URINE RAPID DRUG SCREEN (HOSP PERFORMED)     Status: Abnormal   Collection Time    07/05/12  4:16 AM      Result Value Range   Opiates NONE DETECTED  NONE DETECTED   Cocaine POSITIVE (*) NONE DETECTED   Benzodiazepines NONE DETECTED  NONE DETECTED   Amphetamines NONE DETECTED  NONE DETECTED   Tetrahydrocannabinol NONE DETECTED  NONE DETECTED   Barbiturates NONE DETECTED  NONE DETECTED   Comment:            DRUG SCREEN FOR MEDICAL PURPOSES     ONLY.  IF CONFIRMATION IS NEEDED     FOR ANY PURPOSE, NOTIFY LAB     WITHIN 5 DAYS.                LOWEST DETECTABLE LIMITS     FOR URINE DRUG SCREEN     Drug Class       Cutoff (ng/mL)     Amphetamine      1000     Barbiturate      200     Benzodiazepine   200     Tricyclics  300     Opiates          300     Cocaine          300     THC              50  POCT PREGNANCY, URINE     Status: None   Collection Time    07/05/12  4:27 AM      Result Value Range   Preg Test, Ur NEGATIVE  NEGATIVE   Comment:            THE SENSITIVITY OF THIS     METHODOLOGY IS >24 mIU/mL   Psychological Evaluations:  Assessment:   AXIS I:  Major Depression, Recurrent severe, Substance Abuse and Substance Induced Mood Disorder AXIS II:  Deferred AXIS III:   Past Medical History  Diagnosis Date  . Sleep apnea   . Anxiety     Pt reports anxiety   AXIS IV:  economic problems, housing problems and problems with  access to health care services AXIS V:  41-50 serious symptoms  Treatment Plan/Recommendations:   1. Admit for crisis management and stabilization. Estimated length of stay 5-7 days. 2. Medication management to reduce current symptoms to base line and improve the patient's level of functioning. Started on Lexapro 5 mg po daily for depressive and anxious symptoms. Trazodone initiated to help improve sleep. Order Vistaril 25 mg every six hours as needed for anxiety.  3. Develop treatment plan to decrease risk of relapse upon discharge of depressive symptoms and the need for readmission. 5. Group therapy to facilitate development of healthy coping skills to use for depression and anxiety. 6. Health care follow up as needed for medical problems. Supplement low potassium with KDUR 20 meq for six doses.  7. Discharge plan to include therapy to help patient cope with stressors and address substance abuse.  8. Call for Consult with Hospitalist for additional specialty patient services as needed.   Treatment Plan Summary: Daily contact with patient to assess and evaluate symptoms and progress in treatment Medication management Current Medications:  Current Facility-Administered Medications  Medication Dose Route Frequency Provider Last Rate Last Dose  . acetaminophen (TYLENOL) tablet 650 mg  650 mg Oral Q6H PRN Fransisca Kaufmann, NP      . alum & mag hydroxide-simeth (MAALOX/MYLANTA) 200-200-20 MG/5ML suspension 30 mL  30 mL Oral Q4H PRN Fransisca Kaufmann, NP      . escitalopram (LEXAPRO) tablet 5 mg  5 mg Oral Daily Fransisca Kaufmann, NP      . hydrOXYzine (ATARAX/VISTARIL) tablet 25 mg  25 mg Oral Q6H PRN Fransisca Kaufmann, NP      . magnesium hydroxide (MILK OF MAGNESIA) suspension 30 mL  30 mL Oral Daily PRN Fransisca Kaufmann, NP      . potassium chloride SA (K-DUR,KLOR-CON) CR tablet 20 mEq  20 mEq Oral BID Fransisca Kaufmann, NP      . traZODone (DESYREL) tablet 50 mg  50 mg Oral QHS PRN Fransisca Kaufmann, NP        Observation  Level/Precautions:  15 minute checks  Laboratory:  CBC Chemistry Profile UDS UA  Psychotherapy:  Group Sessions  Medications:  See list  Consultations:  As needed  Discharge Concerns:  Safety, Risk for continued drug abuse  Estimated LOS: 5-7 days  Other:     I certify that inpatient services furnished can reasonably be expected to improve the patient's condition.   Fransisca Kaufmann NP-C 7/3/20142:33 PM  Patient is personally evaluated along with physician extender, care plan developed and case discussed. Reviewed the information documented and agree with the treatment plan.  Durward Matranga,JANARDHAHA R. 07/05/2012 5:38 PM

## 2012-07-05 NOTE — ED Provider Notes (Signed)
History    CSN: [id number] Arrival date & time 07/05/12  0256  First MD Initiated Contact with Patient 07/05/12 0302     Chief Complaint  Patient presents with  . Medical Clearance  . Suicidal   HPI  History provided by the patient. The patient is a 42 year old female who presents with depression and suicidal ideations. Patient has past history of crack cocaine abuse and states she has recently been using crack cocaine for the past 2 months. Her last use was a few hours prior to arrival and she has felt very sad and depressed and states she did not know whether she should "jump in front of a bus" or go home to her kids. Patient has 8 children currently living with 4 of them ages 60 through 25. She reports past history of suicide attempts by cutting her wrists. She is not on any medications. Denies any other drug or alcohol use. There is no other aggravating or alleviating factors. No other associated symptoms.    History reviewed. No pertinent past medical history. Past Surgical History  Procedure Laterality Date  . Cesarean section     No family history on file. History  Substance Use Topics  . Smoking status: Never Smoker   . Smokeless tobacco: Not on file  . Alcohol Use: No   OB History   Grav Para Term Preterm Abortions TAB SAB Ect Mult Living                 Review of Systems  All other systems reviewed and are negative.    Allergies  Flagyl  Home Medications   Current Outpatient Rx  Name  Route  Sig  Dispense  Refill  . amoxicillin (AMOXIL) 500 MG capsule   Oral   Take 1 capsule (500 mg total) by mouth 3 (three) times daily.   30 capsule   0   . predniSONE (DELTASONE) 20 MG tablet   Oral   Take 2 tablets (40 mg total) by mouth daily.   10 tablet   0    BP 147/91  Pulse 71  Temp(Src) 98.5 F (36.9 C) (Oral)  Resp 20  SpO2 99%  LMP 07/03/2012 Physical Exam  Nursing note and vitals reviewed. Constitutional: She is oriented to person, place, and  time. She appears well-developed and well-nourished. No distress.  HENT:  Head: Normocephalic.  Eyes: Conjunctivae are normal. Pupils are equal, round, and reactive to light.  Cardiovascular: Normal rate and regular rhythm.   No murmur heard. Pulmonary/Chest: Effort normal and breath sounds normal. No respiratory distress. She has no wheezes. She has no rales.  Abdominal: Soft.  Musculoskeletal: Normal range of motion.  Neurological: She is alert and oriented to person, place, and time.  Skin: Skin is warm and dry. No rash noted.  Psychiatric: She has a normal mood and affect. Her behavior is normal.    ED Course  Procedures   Results for orders placed during the hospital encounter of 07/05/12  CBC      Result Value Range   WBC 6.3  4.0 - 10.5 K/uL   RBC 3.83 (*) 3.87 - 5.11 MIL/uL   Hemoglobin 11.4 (*) 12.0 - 15.0 g/dL   HCT 62.9 (*) 52.8 - 41.3 %   MCV 87.5  78.0 - 100.0 fL   MCH 29.8  26.0 - 34.0 pg   MCHC 34.0  30.0 - 36.0 g/dL   RDW 24.4  01.0 - 27.2 %   Platelets 233  150 - 400 K/uL  COMPREHENSIVE METABOLIC PANEL      Result Value Range   Sodium 138  135 - 145 mEq/L   Potassium 3.1 (*) 3.5 - 5.1 mEq/L   Chloride 103  96 - 112 mEq/L   CO2 24  19 - 32 mEq/L   Glucose, Bld 113 (*) 70 - 99 mg/dL   BUN 12  6 - 23 mg/dL   Creatinine, Ser 1.61  0.50 - 1.10 mg/dL   Calcium 9.5  8.4 - 09.6 mg/dL   Total Protein 7.0  6.0 - 8.3 g/dL   Albumin 3.9  3.5 - 5.2 g/dL   AST 12  0 - 37 U/L   ALT 13  0 - 35 U/L   Alkaline Phosphatase 45  39 - 117 U/L   Total Bilirubin 1.3 (*) 0.3 - 1.2 mg/dL   GFR calc non Af Amer 66 (*) >90 mL/min   GFR calc Af Amer 76 (*) >90 mL/min  URINALYSIS, ROUTINE W REFLEX MICROSCOPIC      Result Value Range   Color, Urine YELLOW  YELLOW   APPearance CLEAR  CLEAR   Specific Gravity, Urine 1.007  1.005 - 1.030   pH 6.5  5.0 - 8.0   Glucose, UA NEGATIVE  NEGATIVE mg/dL   Hgb urine dipstick NEGATIVE  NEGATIVE   Bilirubin Urine NEGATIVE  NEGATIVE    Ketones, ur NEGATIVE  NEGATIVE mg/dL   Protein, ur NEGATIVE  NEGATIVE mg/dL   Urobilinogen, UA 0.2  0.0 - 1.0 mg/dL   Nitrite NEGATIVE  NEGATIVE   Leukocytes, UA NEGATIVE  NEGATIVE  ETHANOL      Result Value Range   Alcohol, Ethyl (B) <11  0 - 11 mg/dL  URINE RAPID DRUG SCREEN (HOSP PERFORMED)      Result Value Range   Opiates NONE DETECTED  NONE DETECTED   Cocaine POSITIVE (*) NONE DETECTED   Benzodiazepines NONE DETECTED  NONE DETECTED   Amphetamines NONE DETECTED  NONE DETECTED   Tetrahydrocannabinol NONE DETECTED  NONE DETECTED   Barbiturates NONE DETECTED  NONE DETECTED  POCT PREGNANCY, URINE      Result Value Range   Preg Test, Ur NEGATIVE  NEGATIVE       1. Cocaine abuse   2. Suicidal ideation     MDM  3:20 AM patient seen and evaluated. Patient appears well in no acute distress.   Spoke with Marchelle Folks with [facility] act team she will add patient to the list. Tele-psych consult also ordered.  Psych holding orders in place.  Dr. Jacky Kindle recommends inpatient psych assessment.   Angus Seller, PA-C 07/05/12 [phone]

## 2012-07-05 NOTE — BH Assessment (Signed)
[facility] Assessment Progress Note      Consulted with Verne Spurr PA re admission for this [facility] patient to [facility]. She accepted her to Dr. Lezlie Lye service room [phone]

## 2012-07-05 NOTE — ED Notes (Signed)
Patient being wanded by security at this time. 

## 2012-07-05 NOTE — Tx Team (Signed)
Initial Interdisciplinary Treatment Plan  PATIENT STRENGTHS: (choose at least two) Ability for insight Communication skills General fund of knowledge Physical Health  PATIENT STRESSORS: Financial difficulties Legal issue Substance abuse   PROBLEM LIST: Problem List/Patient Goals Date to be addressed Date deferred Reason deferred Estimated date of resolution  SI 07/05/12     Substance abuse 73/14                                                DISCHARGE CRITERIA:  Ability to meet basic life and health needs Adequate post-discharge living arrangements Improved stabilization in mood, thinking, and/or behavior Medical problems require only outpatient monitoring Motivation to continue treatment in a less acute level of care Need for constant or close observation no longer present Reduction of life-threatening or endangering symptoms to within safe limits Safe-care adequate arrangements made Verbal commitment to aftercare and medication compliance Withdrawal symptoms are absent or subacute and managed without 24-hour nursing intervention  PRELIMINARY DISCHARGE PLAN: Outpatient therapy Return to previous living arrangement  PATIENT/FAMIILY INVOLVEMENT: This treatment plan has been presented to and reviewed with the patient, Paula Mullins, and/or family member, .  The patient and family have been given the opportunity to ask questions and make suggestions.  Beatrix Shipper 07/05/2012, 2:08 PM

## 2012-07-05 NOTE — ED Notes (Signed)
Telepsych monitor placed in room with pt

## 2012-07-05 NOTE — BH Assessment (Signed)
Assessment Note  Update:  Received call from [facility] stating pt accepted by Shelda Jakes, PA to Dr. Sidonie Dickens to the [facility] and that pt could be transported to [facility].  Updated EDP Ghim and ED staff.  Completed support paperwork and updated assessment disposition.  Pt to be transported to [facility] via security, as pt is voluntary.    Disposition:  Disposition Initial Assessment Completed for this Encounter: Yes Disposition of Patient: Inpatient treatment program Type of inpatient treatment program: Adult (Pt accepted [facility])  On Site Evaluation by:   Reviewed with Physician:  Lolita Lenz, Rennis Harding 07/05/2012 10:14 AM

## 2012-07-05 NOTE — [facility] Suicide Risk Assessment (Signed)
Suicide Risk Assessment  Admission Assessment     Nursing information obtained from:    Demographic factors:    Current Mental Status:    Loss Factors:    Historical Factors:    Risk Reduction Factors:     CLINICAL FACTORS:   Severe Anxiety and/or Agitation Depression:   Comorbid alcohol abuse/dependence Hopelessness Impulsivity Insomnia Recent sense of peace/wellbeing Alcohol/Substance Abuse/Dependencies Previous Psychiatric Diagnoses and Treatments  COGNITIVE FEATURES THAT CONTRIBUTE TO RISK:  Closed-mindedness Polarized thinking    SUICIDE RISK:   Mild:  Suicidal ideation of limited frequency, intensity, duration, and specificity.  There are no identifiable plans, no associated intent, mild dysphoria and related symptoms, good self-control (both objective and subjective assessment), few other risk factors, and identifiable protective factors, including available and accessible social support.  PLAN OF CARE: Patient is voluntarily, emergently to [facility] from [facility] for substance induced mood disorder.   I certify that inpatient services furnished can reasonably be expected to improve the patient's condition.  Khylin Gutridge,JANARDHAHA R. 07/05/2012, 1:11 PM

## 2012-07-05 NOTE — ED Notes (Signed)
Act team in room with pt 

## 2012-07-06 DIAGNOSIS — F1994 Other psychoactive substance use, unspecified with psychoactive substance-induced mood disorder: Principal | ICD-10-CM

## 2012-07-06 DIAGNOSIS — F332 Major depressive disorder, recurrent severe without psychotic features: Secondary | ICD-10-CM

## 2012-07-06 MED ORDER — ESCITALOPRAM OXALATE 10 MG PO TABS
10.0000 mg | ORAL_TABLET | Freq: Every day | ORAL | Status: DC
  Administered 2012-07-07 – 2012-07-10 (×4): 10 mg via ORAL
  Filled 2012-07-06 (×3): qty 1
  Filled 2012-07-06: qty 14
  Filled 2012-07-06 (×2): qty 1

## 2012-07-06 NOTE — Progress Notes (Signed)
D: Patient appropriate and cooperative with staff. Patient's affect/mood is depressed. She reported on the self inventory sheet that she slept well, appetite/ability to pay attention are good and energy level is normal. Patient rated depression "7" and feelings of hopelessness "5". She reported that changes she plans to make to better care for self is to do what's right for her because she has a lot to live for and the kids need her.  A: Support and encouragement provided to patient. Administered scheduled medications per ordering MD. Monitor Q15 minute checks for safety.  R: Patient receptive. Denies SI/HI/AVH. Patient remains safe on the unit.
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
## 2012-07-06 NOTE — Progress Notes (Signed)
[facility] Group Notes:  (Nursing/MHT/Case Management/Adjunct)  Date:  07/06/2012  Time:  2000  Type of Therapy:  Psychoeducational Skills  Participation Level:  Active  Participation Quality:  Appropriate  Affect:  Appropriate  Cognitive:  Appropriate  Insight:  Appropriate  Engagement in Group:  Developing/Improving  Modes of Intervention:  Education  Summary of Progress/Problems: The patient described her day as having been "all right". She states that she is feeling better but would not expound. Her goal for tomorrow is to have a better day than today.   Hazle Coca S 07/06/2012, 11:42 PM

## 2012-07-06 NOTE — Progress Notes (Signed)
[facility] MD Progress Note  07/06/2012 2:17 PM Paula Mullins  MRN:  [id number] Subjective:  Paula Mullins appears depressed today rating her depression at four. She feels that she is tolerating her medicine all right. Patient expresses interest in getting treatment for her cocaine addiction. She would like to talk with case manager about going to [facility] a place that will allow her young child to stay with her. Patient feels very overwhelmed with her drug problem and is afraid of relapsing after leaving the hospital. This results in patient feeling SI but denies having a plan at this time.  Diagnosis:   Axis I: Major Depression, Recurrent severe and Substance Induced Mood Disorder Axis II: Deferred Axis III:  Past Medical History  Diagnosis Date  . Sleep apnea   . Anxiety     Pt reports anxiety   Axis IV: economic problems, housing problems, problems related to social environment and problems with primary support group Axis V: 41-50 serious symptoms  ADL's:  Intact  Sleep: Good  Appetite:  Fair  Suicidal Ideation:  Passive SI no plan Homicidal Ideation:  Denies AEB (as evidenced by):  Psychiatric Specialty Exam: Review of Systems  Constitutional: Negative.   HENT: Negative.   Eyes: Negative.   Respiratory: Negative.   Cardiovascular: Negative.   Gastrointestinal: Negative.   Genitourinary: Negative.   Musculoskeletal: Negative.   Skin: Negative.   Neurological: Negative.   Endo/Heme/Allergies: Negative.   Psychiatric/Behavioral: Positive for depression, suicidal ideas and substance abuse. Negative for hallucinations and memory loss. The patient is nervous/anxious. The patient does not have insomnia.     Blood pressure 116/79, pulse 51, temperature 98.1 F (36.7 C), temperature source Oral, resp. rate 16, height 5\' 3"  (1.6 m), weight 63.504 kg (140 lb), last menstrual period 07/03/2012.Body mass index is 24.81 kg/(m^2).  General Appearance: Casual and Fairly Groomed  Eye  Contact::  Good  Speech:  Clear and Coherent  Volume:  Decreased  Mood:  Depressed, Dysphoric and Hopeless  Affect:  Depressed and Flat  Thought Process:  Goal Directed and Intact  Orientation:  Full (Time, Place, and Person)  Thought Content:  WDL  Suicidal Thoughts:  Yes.  without intent/plan  Homicidal Thoughts:  No  Memory:  Immediate;   Good Recent;   Good Remote;   Good  Judgement:  Impaired  Insight:  Shallow  Psychomotor Activity:  Normal  Concentration:  Fair  Recall:  Fair  Akathisia:  No  Handed:  Right  AIMS (if indicated):     Assets:  Communication Skills Desire for Improvement Intimacy Leisure Time Physical Health Social Support  Sleep:  Number of Hours: 6.75   Current Medications: Current Facility-Administered Medications  Medication Dose Route Frequency Provider Last Rate Last Dose  . acetaminophen (TYLENOL) tablet 650 mg  650 mg Oral Q6H PRN Fransisca Kaufmann, NP      . alum & mag hydroxide-simeth (MAALOX/MYLANTA) 200-200-20 MG/5ML suspension 30 mL  30 mL Oral Q4H PRN Fransisca Kaufmann, NP      . Melene Muller ON 07/07/2012] escitalopram (LEXAPRO) tablet 10 mg  10 mg Oral Daily Fransisca Kaufmann, NP      . hydrOXYzine (ATARAX/VISTARIL) tablet 25 mg  25 mg Oral Q6H PRN Fransisca Kaufmann, NP      . magnesium hydroxide (MILK OF MAGNESIA) suspension 30 mL  30 mL Oral Daily PRN Fransisca Kaufmann, NP      . potassium chloride SA (K-DUR,KLOR-CON) CR tablet 20 mEq  20 mEq Oral BID Fransisca Kaufmann, NP  20 mEq at 07/06/12 0753  . traZODone (DESYREL) tablet 50 mg  50 mg Oral QHS PRN Fransisca Kaufmann, NP        Lab Results:  Results for orders placed during the hospital encounter of 07/05/12 (from the past 48 hour(s))  CBC     Status: Abnormal   Collection Time    07/05/12  3:27 AM      Result Value Range   WBC 6.3  4.0 - 10.5 K/uL   RBC 3.83 (*) 3.87 - 5.11 MIL/uL   Hemoglobin 11.4 (*) 12.0 - 15.0 g/dL   HCT 82.9 (*) 56.2 - 13.0 %   MCV 87.5  78.0 - 100.0 fL   MCH 29.8  26.0 - 34.0 pg   MCHC 34.0  30.0  - 36.0 g/dL   RDW 86.5  78.4 - 69.6 %   Platelets 233  150 - 400 K/uL  COMPREHENSIVE METABOLIC PANEL     Status: Abnormal   Collection Time    07/05/12  3:27 AM      Result Value Range   Sodium 138  135 - 145 mEq/L   Potassium 3.1 (*) 3.5 - 5.1 mEq/L   Chloride 103  96 - 112 mEq/L   CO2 24  19 - 32 mEq/L   Glucose, Bld 113 (*) 70 - 99 mg/dL   BUN 12  6 - 23 mg/dL   Creatinine, Ser 2.95  0.50 - 1.10 mg/dL   Calcium 9.5  8.4 - 28.4 mg/dL   Total Protein 7.0  6.0 - 8.3 g/dL   Albumin 3.9  3.5 - 5.2 g/dL   AST 12  0 - 37 U/L   ALT 13  0 - 35 U/L   Alkaline Phosphatase 45  39 - 117 U/L   Total Bilirubin 1.3 (*) 0.3 - 1.2 mg/dL   GFR calc non Af Amer 66 (*) >90 mL/min   GFR calc Af Amer 76 (*) >90 mL/min   Comment:            The eGFR has been calculated     using the CKD EPI equation.     This calculation has not been     validated in all clinical     situations.     eGFR's persistently     <90 mL/min signify     possible Chronic Kidney Disease.  ETHANOL     Status: None   Collection Time    07/05/12  3:27 AM      Result Value Range   Alcohol, Ethyl (B) <11  0 - 11 mg/dL   Comment:            LOWEST DETECTABLE LIMIT FOR     SERUM ALCOHOL IS 11 mg/dL     FOR MEDICAL PURPOSES ONLY  URINALYSIS, ROUTINE W REFLEX MICROSCOPIC     Status: None   Collection Time    07/05/12  4:16 AM      Result Value Range   Color, Urine YELLOW  YELLOW   APPearance CLEAR  CLEAR   Specific Gravity, Urine 1.007  1.005 - 1.030   pH 6.5  5.0 - 8.0   Glucose, UA NEGATIVE  NEGATIVE mg/dL   Hgb urine dipstick NEGATIVE  NEGATIVE   Bilirubin Urine NEGATIVE  NEGATIVE   Ketones, ur NEGATIVE  NEGATIVE mg/dL   Protein, ur NEGATIVE  NEGATIVE mg/dL   Urobilinogen, UA 0.2  0.0 - 1.0 mg/dL   Nitrite NEGATIVE  NEGATIVE   Leukocytes,  UA NEGATIVE  NEGATIVE   Comment: MICROSCOPIC NOT DONE ON URINES WITH NEGATIVE PROTEIN, BLOOD, LEUKOCYTES, NITRITE, OR GLUCOSE <1000 mg/dL.  URINE RAPID DRUG SCREEN (HOSP  PERFORMED)     Status: Abnormal   Collection Time    07/05/12  4:16 AM      Result Value Range   Opiates NONE DETECTED  NONE DETECTED   Cocaine POSITIVE (*) NONE DETECTED   Benzodiazepines NONE DETECTED  NONE DETECTED   Amphetamines NONE DETECTED  NONE DETECTED   Tetrahydrocannabinol NONE DETECTED  NONE DETECTED   Barbiturates NONE DETECTED  NONE DETECTED   Comment:            DRUG SCREEN FOR MEDICAL PURPOSES     ONLY.  IF CONFIRMATION IS NEEDED     FOR ANY PURPOSE, NOTIFY LAB     WITHIN 5 DAYS.                LOWEST DETECTABLE LIMITS     FOR URINE DRUG SCREEN     Drug Class       Cutoff (ng/mL)     Amphetamine      1000     Barbiturate      200     Benzodiazepine   200     Tricyclics       300     Opiates          300     Cocaine          300     THC              50  POCT PREGNANCY, URINE     Status: None   Collection Time    07/05/12  4:27 AM      Result Value Range   Preg Test, Ur NEGATIVE  NEGATIVE   Comment:            THE SENSITIVITY OF THIS     METHODOLOGY IS >24 mIU/mL    Physical Findings: AIMS: Facial and Oral Movements Muscles of Facial Expression: None, normal Lips and Perioral Area: None, normal Jaw: None, normal Tongue: None, normal,Extremity Movements Upper (arms, wrists, hands, fingers): None, normal Lower (legs, knees, ankles, toes): None, normal, Trunk Movements Neck, shoulders, hips: None, normal, Overall Severity Severity of abnormal movements (highest score from questions above): None, normal Incapacitation due to abnormal movements: None, normal Patient's awareness of abnormal movements (rate only patient's report): No Awareness, Dental Status Current problems with teeth and/or dentures?: No Does patient usually wear dentures?: No  CIWA:  CIWA-Ar Total: 3 COWS:  COWS Total Score: 1  Treatment Plan Summary: Daily contact with patient to assess and evaluate symptoms and progress in treatment Medication management  Plan: Continue crisis  management and stabilization.  Medication management: Reviewed with patient who stated no untoward effects. Increase Lexapro to 10 mg daily to target continued symptoms of depression.  Encouraged patient to attend groups and participate in group counseling sessions and activities.  Discharge plan in progress.  Address health issues: Vitals reviewed and stable.  Continue current treatment plan.   Medical Decision Making Problem Points:  Established problem, stable/improving (1) and Review of psycho-social stressors (1) Data Points:  Review of medication regiment & side effects (2)  I certify that inpatient services furnished can reasonably be expected to improve the patient's condition.   Amor Packard NP-C 07/06/2012, 2:17 PM

## 2012-07-06 NOTE — Progress Notes (Signed)
Patient ID: Paula Mullins, female   DOB: 1970/01/29, 42 y.o.   MRN: [id number]  D: Pt denies SI/HI/AVH/Pain. Pt is pleasant and cooperative. Pt states "All things good, I got to talk to my baby". Pt states she wants to go to [facility] house when she leaves here .   A: Pt was offered support and encouragement. Pt was given scheduled medications. Pt was encourage to attend groups. Q 15 minute checks were done for safety.   R:Pt attends groups and interacts well with peers and staff. Pt is taking medication. Pt receptive to treatment and safety maintained on unit.

## 2012-07-06 NOTE — Progress Notes (Signed)
Patient ID: Paula Mullins, female   DOB: Oct 17, 1970, 42 y.o.   MRN: [id number]  D: Pt denies SI/HI/AVH. Pt is pleasant and cooperative. Pt slept most of the night. Did not get to talk to pt until the morning. Pt stated she was feeling ok and she wanted help since she relapsed on the crack cocaine.  A:   Q 15 minute checks were done for safety.   R:  Pt has no complaints at this time.Pt receptive to treatment and safety maintained on unit.

## 2012-07-06 NOTE — ED Provider Notes (Signed)
Medical screening examination/treatment/procedure(s) were conducted as a shared visit with non-physician practitioner(s) and myself.  I personally evaluated the patient during the encounter  Patient seen by me. Patient we moved back to a C. Pod awaiting behavioral health evaluation. Patient has depression and suicidal ideations. Also had a relapse in her substance abuse problem.  Shelda Jakes, MD 07/06/12 [phone]

## 2012-07-07 NOTE — Progress Notes (Signed)
[name] MD Progress Note  07/07/2012 1:57 PM Paula Mullins  MRN:  [id number] Subjective:  Paula Mullins is lying in bed this afternoon, and was easily awakened by calling her name. She reports that she is making significant improvement. She rates her anxiety and depression both as a 1 on a scale of 1-10. She denies any cravings for cocaine. She denies any suicidal or homicidal ideation. She denies any auditory or visual hallucinations. She endorses good sleep and appetite. She reports that she is tolerating her medications without side effects. She states that she filled out the application for [facility], and asked if it could be faxed today.  Diagnosis:   Axis I: Major Depression, Recurrent severe, Substance Abuse and Substance Induced Mood Disorder Axis II: Deferred Axis III:  Past Medical History  Diagnosis Date  . Sleep apnea   . Anxiety     Pt reports anxiety    ADL's:  Intact  Sleep: Good  Appetite:  Good  Suicidal Ideation:  Patient denies any thought, plan, or intent Homicidal Ideation:  Patient denies any thought, plan, or intent  AEB (as evidenced by):  Psychiatric Specialty Exam: Review of Systems  Constitutional: Negative.   HENT: Negative.   Eyes: Negative.   Respiratory: Negative.   Cardiovascular: Negative.   Gastrointestinal: Negative.   Genitourinary: Negative.   Musculoskeletal: Negative.   Skin: Negative.   Neurological: Negative.   Endo/Heme/Allergies: Negative.   Psychiatric/Behavioral: Negative.     Blood pressure 123/77, pulse 64, temperature 97.8 F (36.6 C), temperature source Oral, resp. rate 18, height 5\' 3"  (1.6 m), weight 63.504 kg (140 lb), last menstrual period 07/03/2012.Body mass index is 24.81 kg/(m^2).  General Appearance: Casual  Eye Contact::  Good  Speech:  Clear and Coherent  Volume:  Normal  Mood:  Anxious  Affect:  Congruent  Thought Process:  Linear  Orientation:  Full (Time, Place, and Person)  Thought Content:  WDL  Suicidal  Thoughts:  No  Homicidal Thoughts:  No  Memory:  Immediate;   Good Recent;   Good Remote;   Good  Judgement:  Impaired  Insight:  Lacking  Psychomotor Activity:  Decreased  Concentration:  Good  Recall:  Good  Akathisia:  No  Handed:  Right  AIMS (if indicated):     Assets:  Communication Skills Desire for Improvement Physical Health Resilience  Sleep:  Number of Hours: 6.25   Current Medications: Current Facility-Administered Medications  Medication Dose Route Frequency Provider Last Rate Last Dose  . acetaminophen (TYLENOL) tablet 650 mg  650 mg Oral Q6H PRN Fransisca Kaufmann, NP      . alum & mag hydroxide-simeth (MAALOX/MYLANTA) 200-200-20 MG/5ML suspension 30 mL  30 mL Oral Q4H PRN Fransisca Kaufmann, NP      . escitalopram (LEXAPRO) tablet 10 mg  10 mg Oral Daily Fransisca Kaufmann, NP   10 mg at 07/07/12 7829  . hydrOXYzine (ATARAX/VISTARIL) tablet 25 mg  25 mg Oral Q6H PRN Fransisca Kaufmann, NP      . magnesium hydroxide (MILK OF MAGNESIA) suspension 30 mL  30 mL Oral Daily PRN Fransisca Kaufmann, NP      . potassium chloride SA (K-DUR,KLOR-CON) CR tablet 20 mEq  20 mEq Oral BID Fransisca Kaufmann, NP   20 mEq at 07/07/12 0839  . traZODone (DESYREL) tablet 50 mg  50 mg Oral QHS PRN Fransisca Kaufmann, NP        Lab Results: No results found for this or any previous visit (from the  past 48 hour(s)).  Physical Findings: AIMS: Facial and Oral Movements Muscles of Facial Expression: None, normal Lips and Perioral Area: None, normal Jaw: None, normal Tongue: None, normal,Extremity Movements Upper (arms, wrists, hands, fingers): None, normal Lower (legs, knees, ankles, toes): None, normal, Trunk Movements Neck, shoulders, hips: None, normal, Overall Severity Severity of abnormal movements (highest score from questions above): None, normal Incapacitation due to abnormal movements: None, normal Patient's awareness of abnormal movements (rate only patient's report): No Awareness, Dental Status Current problems with  teeth and/or dentures?: No Does patient usually wear dentures?: No  CIWA:  CIWA-Ar Total: 3 COWS:  COWS Total Score: 1  Treatment Plan Summary: Daily contact with patient to assess and evaluate symptoms and progress in treatment Medication management  Plan: We will continue her current plan of care, and research options for followup.  Medical Decision Making Problem Points:  Established problem, stable/improving (1) and Review of psycho-social stressors (1) Data Points:  Review or order clinical lab tests (1) Review of medication regiment & side effects (2)  I certify that inpatient services furnished can reasonably be expected to improve the patient's condition.   Jalah Warmuth 07/07/2012, 1:57 PM

## 2012-07-07 NOTE — Progress Notes (Signed)
Goals  Group Note  Date:  07/07/2012 Time: 1015  Group Topic/Focus:  Identifying  Goals : This groups is focused on helping patients identify goals they want to work towards and identify strategies needed to achieve these goals. Participation Level:  active Participation Quality: good Affect: flat Cognitive:    Insight:  good  Engagement in Group: engaged  Additional Comments:   PD RN [facility]

## 2012-07-07 NOTE — Progress Notes (Signed)
Psychoeducational Group Note  Date: 07/07/2012 Time:  1015  Group Topic/Focus:  Identifying Needs:   The focus of this group is to help patients identify their personal needs that have been historically problematic and identify healthy behaviors to address their needs.  Participation Level:  Active  Participation Quality:  Appropriate  Affect:  Appropriate  Cognitive:  Oriented  Insight: Improving  Engagement in Group:  Engaged  Additional Comments:  Attended the group and was involved throughout the entire group  Amanii Snethen A 

## 2012-07-07 NOTE — Progress Notes (Signed)
[name] is seen OOB UAL on the [address] hall today. SHe says " I guess I'm ok". Her affect is flat, sad and depressed. SHe is slightly guarded, only allowing herslef ot make and keep eye contact  For a few seconds at a time.   A SHe completed her AM self inventory and on it she wrote she denied SI within the past 24 hrs, she rated her depression and hopelessness "1/1" and she stated her DC plan is to " exercise".   R Safety is in place and POC moves forward.

## 2012-07-07 NOTE — [facility] Group Notes (Signed)
[facility] Group Notes:  (Clinical Social Work)  07/07/2012   3:00-4:00PM  Summary of Progress/Problems:   The main focus of today's process group was for the patient to identify something in their life that led to their hospitalization that they would like to change, then to discuss their motivation to change.  The Stages of Change were explained to the group, then each patient identified where they are in that process.  A scaling question was used with motivation interviewing to determine the patient's current motivation to change the identified behavior (1-10, low to high). The patient expressed that she loses focus on what she is working on, and that this is a self-sabotaging behavior she engages in.  She smiled and listened throughout the rest of group but did not participate actively in the lively discussion.  Type of Therapy:  Process Group  Participation Level:  Active  Participation Quality:  Attentive  Affect:  Blunted  Cognitive:  Oriented  Insight:  Developing/Improving  Engagement in Therapy:  Engaged  Modes of Intervention:  Education, Motivational Interviewing   Ambrose Mantle, LCSW 07/07/2012, 4:42 PM

## 2012-07-07 NOTE — Progress Notes (Signed)
D.  Pt pleasant on approach, in dayroom watching TV with peers.  Denies complaints at this time.  Positive for evening wrap up group.  Interacting appropriately within milieu.  Denies SI/HI/hallucinations at this time.  A.  Support and encouragement offered  R.  Pt remains safe on unit, will continue to monitor.

## 2012-07-07 NOTE — Progress Notes (Signed)
[facility] Group Notes:  (Nursing/MHT/Case Management/Adjunct)  Date:  07/07/2012  Time:  2000  Type of Therapy:  Psychoeducational Skills  Participation Level:  Active  Participation Quality:  Attentive  Affect:  Appropriate  Cognitive:  Appropriate  Insight:  Good  Engagement in Group:  Developing/Improving  Modes of Intervention:  Education  Summary of Progress/Problems: The patient stated that she had a good day since she had a family visit. Her goal for tomorrow is to focus on where she will be going following discharge.   Hazle Coca S 07/07/2012, 11:35 PM

## 2012-07-08 NOTE — Progress Notes (Signed)
D Silvio Pate is more relaxed today. She attends her groups, is engaged in her recovery as evidenced by her active participation in  Her grouops and she writes she denies SI within the past 24 hrs, she rated her derpession and hopelessness "1/1" and she states her DC plans are to " go to outpatient therapy".'   A She denies needs for prns. She interacts appropriately with the staff and her peers.   R Safety is in place and POC moves forward.

## 2012-07-08 NOTE — Progress Notes (Signed)
D.  Pt pleasant on approach, positive for evening wrap up group.  Denied complaints of any kind.  Stated medication worked well last night for her to sleep, wanted to take the same tonight.  Denies SI/HI/hallucinations at this time.  Interacting appropriately within milieu.  A.  Support and encouragement offered, medication given as ordered  R.  Pt remains safe on unit, will continue to monitor.

## 2012-07-08 NOTE — Progress Notes (Signed)
Patient ID: Paula Mullins, female   DOB: [birth year], 42 y.o.   MRN: [id number] [facility] MD Progress Note  07/08/2012 8:29 PM Paula Mullins  MRN:  [id number] Subjective:    Attending groups today. Calm. She rates her anxiety and depression are improving. She denies any cravings for cocaine. She denies any suicidal or homicidal ideation. She denies any auditory or visual hallucinations. She endorses good sleep and appetite. She reports that she is tolerating her medications without side effects.   Diagnosis:   Axis I: Major Depression, Recurrent severe, Substance Abuse and Substance Induced Mood Disorder Axis II: Deferred Axis III:  Past Medical History  Diagnosis Date  . Sleep apnea   . Anxiety     Pt reports anxiety    ADL's:  Intact  Sleep: Good  Appetite:  Good  Suicidal Ideation:  Patient denies any thought, plan, or intent Homicidal Ideation:  Patient denies any thought, plan, or intent  AEB (as evidenced by):  Psychiatric Specialty Exam: Review of Systems  Constitutional: Negative.   HENT: Negative.   Eyes: Negative.   Respiratory: Negative.   Cardiovascular: Negative.   Gastrointestinal: Negative.   Genitourinary: Negative.   Musculoskeletal: Negative.   Skin: Negative.   Neurological: Negative.   Endo/Heme/Allergies: Negative.   Psychiatric/Behavioral: Negative.     Blood pressure 99/60, pulse 70, temperature 98 F (36.7 C), temperature source Oral, resp. rate 16, height 5\' 3"  (1.6 m), weight 63.504 kg (140 lb), last menstrual period 07/03/2012.Body mass index is 24.81 kg/(m^2).  General Appearance: Casual  Eye Contact::  Good  Speech:  Clear and Coherent  Volume:  Normal  Mood:  Anxious  Affect:  Congruent  Thought Process:  Linear  Orientation:  Full (Time, Place, and Person)  Thought Content:  WDL  Suicidal Thoughts:  No  Homicidal Thoughts:  No  Memory:  Immediate;   Good Recent;   Good Remote;   Good  Judgement:  Impaired  Insight:  Lacking   Psychomotor Activity:  Decreased  Concentration:  Good  Recall:  Good  Akathisia:  No  Handed:  Right  AIMS (if indicated):     Assets:  Communication Skills Desire for Improvement Physical Health Resilience  Sleep:  Number of Hours: 6.5   Current Medications: Current Facility-Administered Medications  Medication Dose Route Frequency Provider Last Rate Last Dose  . acetaminophen (TYLENOL) tablet 650 mg  650 mg Oral Q6H PRN Fransisca Kaufmann, NP      . alum & mag hydroxide-simeth (MAALOX/MYLANTA) 200-200-20 MG/5ML suspension 30 mL  30 mL Oral Q4H PRN Fransisca Kaufmann, NP      . escitalopram (LEXAPRO) tablet 10 mg  10 mg Oral Daily Fransisca Kaufmann, NP   10 mg at 07/08/12 0800  . hydrOXYzine (ATARAX/VISTARIL) tablet 25 mg  25 mg Oral Q6H PRN Fransisca Kaufmann, NP   25 mg at 07/07/12 2131  . magnesium hydroxide (MILK OF MAGNESIA) suspension 30 mL  30 mL Oral Daily PRN Fransisca Kaufmann, NP      . traZODone (DESYREL) tablet 50 mg  50 mg Oral QHS PRN Fransisca Kaufmann, NP   50 mg at 07/07/12 2131    Lab Results: No results found for this or any previous visit (from the past 48 hour(s)).  Physical Findings: AIMS: Facial and Oral Movements Muscles of Facial Expression: None, normal Lips and Perioral Area: None, normal Jaw: None, normal Tongue: None, normal,Extremity Movements Upper (arms, wrists, hands, fingers): None, normal Lower (legs, knees, ankles, toes): None, normal, Trunk  Movements Neck, shoulders, hips: None, normal, Overall Severity Severity of abnormal movements (highest score from questions above): None, normal Incapacitation due to abnormal movements: None, normal Patient's awareness of abnormal movements (rate only patient's report): No Awareness, Dental Status Current problems with teeth and/or dentures?: No Does patient usually wear dentures?: No  CIWA:  CIWA-Ar Total: 3 COWS:  COWS Total Score: 1  Treatment Plan Summary: Daily contact with patient to assess and evaluate symptoms and progress in  treatment Medication management  Plan: We will continue her current plan of care, and research options for followup.  Medical Decision Making Problem Points:  Established problem, stable/improving (1) and Review of psycho-social stressors (1) Data Points:  Review or order clinical lab tests (1) Review of medication regiment & side effects (2)  I certify that inpatient services furnished can reasonably be expected to improve the patient's condition.   Wonda Cerise 07/08/2012, 8:29 PM

## 2012-07-08 NOTE — [facility] Group Notes (Signed)
[facility] Group Notes:  (Clinical Social Work)  07/08/2012   3:00-4:00PM  Summary of Progress/Problems:   The main focus of today's process group was to   identify the patient's current support system and decide on other supports that can be put in place.  The picture on workbook was used to discuss why additional supports are needed, and a hand-out was distributed with four definitions/levels of support, then used to talk about how patients have given and received all different kinds of support.  An emphasis was placed on using counselor, doctor, therapy groups, 12-step groups, and problem-specific support groups to expand supports.  The patient stated her current supports are her fiance, sister and daughter.  She left prior to the end of group.  Type of Therapy:  Process Group  Participation Level:  Minimal  Participation Quality:  Attentive  Affect:  Blunted  Cognitive:  Oriented  Insight:  Developing/Improving  Engagement in Therapy:  Improving and Limited  Modes of Intervention:  Education,  Support and Processing  Ambrose Mantle, LCSW 07/08/2012, 4:28 PM

## 2012-07-09 DIAGNOSIS — F1910 Other psychoactive substance abuse, uncomplicated: Secondary | ICD-10-CM

## 2012-07-09 NOTE — Progress Notes (Signed)
D:  Mossie reports that she slept well and her appetite is good.  She is rating depression and hopelessness at 1/10.  She is very positive and upbeat and is denying any SI/HI/AVH today. A:  Safety checks q 15 minutes.  Emotional support provided.  Medications administered as ordered. R;  Safety maintained on unit.

## 2012-07-09 NOTE — Progress Notes (Signed)
Grief and Loss Group  Group members processed their feelings and experiences related to grief and loss. Group members shared helpful coping strategies in dealing with their grief and loss.   Pt left during the middle of the group session. Pt was present and attentive during the time she was in group and did not make verbal responses.  Sherol Dade  Counselor Intern  Haroldine Laws

## 2012-07-09 NOTE — [facility] Group Notes (Signed)
[facility] LCSW Aftercare Discharge Planning Group Note   07/09/2012  8:45 AM  Participation Quality:  Alert and Appropriate   Mood/Affect:  Appropriate  Depression Rating:  0  Anxiety Rating:  0  Thoughts of Suicide:  Pt denies SI/HI  Will you contract for safety?   Yes  Current AVH:  Pt denies  Plan for Discharge/Comments:  Pt attended discharge planning group and actively participated in group.  CSW provided pt with today's workbook.  Pt reports feeling ready to d/c soon.  Pt states that she plans to stay in [place] with her daughter until she gets accepted to [facility] for longer treatment and housing.  Pt has follow up scheduled at [facility] for medication management and therapy. No further needs voiced by pt at this time.    Transportation Means: Pt reports having access to transportation - provided pt a bus pass (in chart for d/c)  Supports: Pt names her fiance as supportive  Reyes Ivan, LCSWA 07/09/2012 11:53 AM

## 2012-07-09 NOTE — [facility] Group Notes (Signed)
[facility] LCSW Group Therapy  07/09/2012  1:15 PM   Type of Therapy:  Group Therapy  Participation Level:  Did Not Attend  Aideen Fenster Horton, LCSWA 07/09/2012 2:43 PM    

## 2012-07-09 NOTE — Progress Notes (Signed)
Recreation Therapy Notes  Date: 07.07.2014 Time: 3:00pm Location: [address] Dayroom      Group Topic/Focus: Leisure Education  Participation Level: Active  Participation Quality: Appropriate  Affect: Flat  Cognitive: Appropriate   Additional Comments: Activity: Leisure Alphabet ; Explanation: Patients were asked to select a letter of the alphabet from a container, then state a leisure/recreation activity that starts with that letter of the alphabet.  Peer defined wellness as: Mind, body & spirit working in concert. Patient as part of the group agreed to use that as group definition of wellness.   Patient actively participated in activity. Patient successfully stated an appropriate leisure/recreation activity to coorespond with the letter she selected. Patient encouraged and assisted peers as needed. Patient listened to discussion about the benefits of leisure/recreation. Patient successfully related group list to each aspect of group definition of wellness.   Marykay Lex Kimmi Acocella, LRT/CTRS  Lizvette Lightsey L 07/09/2012 4:07 PM

## 2012-07-09 NOTE — Progress Notes (Addendum)
Patient ID: Paula Mullins, female   DOB: 1970-10-10, 42 y.o.   MRN: [id number]  [facility] MD Progress Note  07/09/2012 3:03 PM Paula Mullins  MRN:  [id number] Subjective:   Patient reports feeling that she has received the help that she needed. She feels that she is responding to the Lexapro that was started. Patient is waiting to finalize her discharge plan for [facility]. She expresses optimism about her future stating "My partner is going into a two year program. We are both getting help so that increased the odds I will succeed after discharge."   Diagnosis:   Axis I: Major Depression, Recurrent severe, Substance Abuse and Substance Induced Mood Disorder Axis II: Deferred Axis III:  Past Medical History  Diagnosis Date  . Sleep apnea   . Anxiety     Pt reports anxiety    ADL's:  Intact  Sleep: Good  Appetite:  Good  Suicidal Ideation:  Patient denies any thought, plan, or intent Homicidal Ideation:  Patient denies any thought, plan, or intent  AEB (as evidenced by):  Psychiatric Specialty Exam: Review of Systems  Constitutional: Negative.   HENT: Negative.   Eyes: Negative.   Respiratory: Negative.   Cardiovascular: Negative.   Gastrointestinal: Negative.   Genitourinary: Negative.   Musculoskeletal: Negative.   Skin: Negative.   Neurological: Negative.   Endo/Heme/Allergies: Negative.   Psychiatric/Behavioral: Positive for depression and substance abuse. Negative for suicidal ideas, hallucinations and memory loss. The patient is not nervous/anxious and does not have insomnia.     Blood pressure 102/66, pulse 74, temperature 97.8 F (36.6 C), temperature source Oral, resp. rate 16, height 5\' 3"  (1.6 m), weight 63.504 kg (140 lb), last menstrual period 07/03/2012.Body mass index is 24.81 kg/(m^2).  General Appearance: Casual  Eye Contact::  Good  Speech:  Clear and Coherent  Volume:  Normal  Mood:  Anxious  Affect:  Congruent  Thought Process:  Linear   Orientation:  Full (Time, Place, and Person)  Thought Content:  WDL  Suicidal Thoughts:  No  Homicidal Thoughts:  No  Memory:  Immediate;   Good Recent;   Good Remote;   Good  Judgement:  Impaired  Insight:  Lacking  Psychomotor Activity:  Decreased  Concentration:  Good  Recall:  Good  Akathisia:  No  Handed:  Right  AIMS (if indicated):     Assets:  Communication Skills Desire for Improvement Physical Health Resilience  Sleep:  Number of Hours: 6.75   Current Medications: Current Facility-Administered Medications  Medication Dose Route Frequency Provider Last Rate Last Dose  . acetaminophen (TYLENOL) tablet 650 mg  650 mg Oral Q6H PRN Fransisca Kaufmann, NP      . alum & mag hydroxide-simeth (MAALOX/MYLANTA) 200-200-20 MG/5ML suspension 30 mL  30 mL Oral Q4H PRN Fransisca Kaufmann, NP      . escitalopram (LEXAPRO) tablet 10 mg  10 mg Oral Daily Fransisca Kaufmann, NP   10 mg at 07/09/12 0820  . hydrOXYzine (ATARAX/VISTARIL) tablet 25 mg  25 mg Oral Q6H PRN Fransisca Kaufmann, NP   25 mg at 07/08/12 2136  . magnesium hydroxide (MILK OF MAGNESIA) suspension 30 mL  30 mL Oral Daily PRN Fransisca Kaufmann, NP      . traZODone (DESYREL) tablet 50 mg  50 mg Oral QHS PRN Fransisca Kaufmann, NP   50 mg at 07/08/12 2136    Lab Results: No results found for this or any previous visit (from the past 48 hour(s)).  Physical  Findings: AIMS: Facial and Oral Movements Muscles of Facial Expression: None, normal Lips and Perioral Area: None, normal Jaw: None, normal Tongue: None, normal,Extremity Movements Upper (arms, wrists, hands, fingers): None, normal Lower (legs, knees, ankles, toes): None, normal, Trunk Movements Neck, shoulders, hips: None, normal, Overall Severity Severity of abnormal movements (highest score from questions above): None, normal Incapacitation due to abnormal movements: None, normal Patient's awareness of abnormal movements (rate only patient's report): No Awareness, Dental Status Current problems  with teeth and/or dentures?: No Does patient usually wear dentures?: No  CIWA:  CIWA-Ar Total: 3 COWS:  COWS Total Score: 1  Treatment Plan Summary: Daily contact with patient to assess and evaluate symptoms and progress in treatment Medication management  Plan: Continue crisis management and stabilization.  Medication management: Reviewed with patient who stated no untoward effects.  Encouraged patient to attend groups and participate in group counseling sessions and activities.  Discharge plan in progress. Anticipate d/c tomorrow.  Address health issues: Vitals reviewed and stable.  Continue current treatment plan.   Medical Decision Making Problem Points:  Established problem, stable/improving (1) and Review of psycho-social stressors (1) Data Points:  Review or order clinical lab tests (1) Review of medication regiment & side effects (2)  I certify that inpatient services furnished can reasonably be expected to improve the patient's condition.   Mckinnley Cottier NP-C 07/09/2012, 3:03 PM

## 2012-07-09 NOTE — Progress Notes (Signed)
Adult Psychoeducational Group Note  Date:  07/09/2012 Time:  2:22 PM  Group Topic/Focus:  Wellness Toolbox:   The focus of this group is to discuss various aspects of wellness, balancing those aspects and exploring ways to increase the ability to experience wellness.  Patients will create a wellness toolbox for use upon discharge.  Participation Level:  Active  Participation Quality:  Appropriate, Attentive and Sharing  Affect:  Appropriate  Cognitive:  Appropriate  Insight: Appropriate  Engagement in Group:  Engaged  Modes of Intervention:  Discussion  Additional Comments:  Pt was appropriate and sharing while attending group. Pt shared that she wants to become more focus with her wellness once she is discharged.  Sharyn Lull 07/09/2012, 2:22 PM

## 2012-07-09 NOTE — Tx Team (Signed)
Interdisciplinary Treatment Plan Update (Adult)  Date: 07/09/2012  Time Reviewed:  9:45 AM  Progress in Treatment: Attending groups: Yes Participating in groups:  Yes Taking medication as prescribed:  Yes Tolerating medication:  Yes Family/Significant othe contact made: CSW assessing  Patient understands diagnosis:  Yes Discussing patient identified problems/goals with staff:  Yes Medical problems stabilized or resolved:  Yes Denies suicidal/homicidal ideation: Yes Issues/concerns per patient self-inventory:  Yes Other:  New problem(s) identified: N/A  Discharge Plan or Barriers: CSW assessing for appropriate referrals.  Reason for Continuation of Hospitalization: Anxiety Depression Medication Stabilization  Comments: N/A  Estimated length of stay: 3-5 days  For review of initial/current patient goals, please see plan of care.  Attendees: Patient:     Family:     Physician:   07/09/2012 9:46 AM   Nursing:   Cynthia Goble, RN 07/09/2012 9:46 AM   Clinical Social Worker:  Arinze Rivadeneira Horton, LCSWA 07/09/2012 9:46 AM   Other: Laura Davis, NP 07/09/2012 9:46 AM   Other:  Maseta Dorley, MA care coordination 07/09/2012 9:46 AM   Other:  Beverly Knight, RN 07/09/2012 9:46 AM   Other:     Other:    Other:    Other:    Other:    Other:    Other:     Scribe for Treatment Team:   Horton, Korena Nass Nicole, 07/09/2012 9:46 AM   

## 2012-07-10 DIAGNOSIS — F329 Major depressive disorder, single episode, unspecified: Secondary | ICD-10-CM

## 2012-07-10 MED ORDER — HYDROXYZINE HCL 25 MG PO TABS: 25.0000 mg | ORAL_TABLET | Freq: Four times a day (QID) | ORAL | Status: DC | PRN

## 2012-07-10 MED ORDER — ESCITALOPRAM OXALATE 10 MG PO TABS: 10.0000 mg | ORAL_TABLET | Freq: Every day | ORAL | Status: DC

## 2012-07-10 MED ORDER — TRAZODONE HCL 50 MG PO TABS: 50.0000 mg | ORAL_TABLET | Freq: Every evening | ORAL | Status: DC | PRN

## 2012-07-10 NOTE — Progress Notes (Signed)
Recreation Therapy Notes  Date: 07.08.2014 Time: 2:45pm Location: [address] Dayroom      Group Topic/Focus: Musician (AAA/T)  Participation Level: Did not attend  [employer], LRT/CTRS  Jearl Klinefelter 07/10/2012 3:53 PM

## 2012-07-10 NOTE — Progress Notes (Signed)
Adult Psychoeducational Group Note  Date:  07/10/2012 Time:  1:14 PM  Group Topic/Focus:  Recovery Goals:   The focus of this group is to identify appropriate goals for recovery and establish a plan to achieve them.  Participation Level:  Active  Participation Quality:  Appropriate, Attentive and Sharing  Affect:  Appropriate  Cognitive:  Alert and Appropriate  Insight: Appropriate  Engagement in Group:  Engaged  Modes of Intervention:  Discussion  Additional Comments:  Pt was appropriate and sharing while attending group. Pt shared that recovery is a process and that people recover different ways.   Sharyn Lull 07/10/2012, 1:14 PM

## 2012-07-10 NOTE — Progress Notes (Signed)
Pt reports she is doing well this evening.  She states she is supposed to discharge home tomorrow.  She plans to go to a program at [facility].  She is hopeful now that she is getting help and states her partner is also getting help by going to a 2 yr program.  She is appropriate in conversation with this Clinical research associate with clear/logical thought process.  Pt denies SI/HI/AV.  Pt voices no needs or concerns at this time.  Pt makes her needs known to staff.  Support and encouragement offered.  Safety maintained with q15 minute checks.

## 2012-07-10 NOTE — [facility] Suicide Risk Assessment (Addendum)
[facility] INPATIENT:  Family/Significant Other Suicide Prevention Education  Suicide Prevention Education:  Contact Attempts: Elsie Amis - fiance [phone], [phone]), (name of family member/significant other) has been identified by the patient as the family member/significant other with whom the patient will be residing, and identified as the person(s) who will aid the patient in the event of a mental health crisis.  With written consent from the patient, two attempts were made to provide suicide prevention education, prior to and/or following the patient's discharge.  We were unsuccessful in providing suicide prevention education.  A suicide education pamphlet was given to the patient to share with family/significant other.  Date and time of first attempt: 07/09/12 3:00 pm Date and time of second attempt: 07/10/12 9:50 am  Both numbers say a message that the number is unavailable at this time, try again later.  CSW provided pt with the information to share with fiance.    Carmina Miller 07/10/2012, 9:53 AM

## 2012-07-10 NOTE — [facility] Group Notes (Signed)
[facility] LCSW Group Therapy  07/10/2012  1:15 PM   Type of Therapy:  Group Therapy  Participation Level:  Did Not Attend - was being discharged  [facility], LCSWA 07/10/2012 2:00 PM

## 2012-07-10 NOTE — [facility] Suicide Risk Assessment (Signed)
Suicide Risk Assessment  Discharge Assessment     Demographic Factors:  NA  Mental Status Per Nursing Assessment::   On Admission:  Suicidal ideation indicated by patient;Self-harm thoughts  Current Mental Status by Physician: In full contact with reality. There are no suicidal ideas, plans or intent. Her mood is euthymic, her affect is appropriate. There are no suicidal ideas, plans or intent. She is looking forward to seeing her family today. She is planning to go to [facility]. Meanwhile she will be going to [place].   Loss Factors: NA  Historical Factors: NA  Risk Reduction Factors:   Positive social support, Sense of responsibility to family  Continued Clinical Symptoms:  Depression:   Comorbid alcohol abuse/dependence  Cognitive Features That Contribute To Risk: none identified   Suicide Risk:  Minimal: No identifiable suicidal ideation.  Patients presenting with no risk factors but with morbid ruminations; may be classified as minimal risk based on the severity of the depressive symptoms  Discharge Diagnoses:   AXIS I:  Major Depression, Substance Abuse, Substance Induced Mood Disorder AXIS II:  Deferred AXIS III:   Past Medical History  Diagnosis Date  . Sleep apnea   . Anxiety     Pt reports anxiety   AXIS IV:  other psychosocial or environmental problems AXIS V:  61-70 mild symptoms  Plan Of Care/Follow-up recommendations:  Activity:  as tolerated Diet:  regular Follow up [facility] Is patient on multiple antipsychotic therapies at discharge:  No   Has Patient had three or more failed trials of antipsychotic monotherapy by history:  No  Recommended Plan for Multiple Antipsychotic Therapies: N/A   Maryella Abood A 07/10/2012, 11:05 AM

## 2012-07-10 NOTE — Discharge Summary (Signed)
Physician Discharge Summary Note  Patient:  Paula Mullins is an 42 y.o., female MRN:  [id number] DOB:  12-21-1970 Patient phone:  [phone] (home)  Patient address:   [address] [place] [address]   Date of Admission:  07/05/2012 Date of Discharge: 07/10/12  Discharge Diagnoses: Active Problems:   * No active hospital problems. *  Axis Diagnosis:  AXIS I: Major Depression, Substance Abuse, Substance Induced Mood Disorder  AXIS II: Deferred  AXIS III:  Past Medical History   Diagnosis  Date   .  Sleep apnea    .  Anxiety      Pt reports anxiety    AXIS IV: other psychosocial or environmental problems  AXIS V: 61-70 mild symptoms  Level of Care:  OP  Hospital Course:   Paula Mullins is an 42 y.o. female who presents with passive suicidal ideation. She initially stated no having a specific plan, but stated that tonight she debated between jumping in front of a bus and coming for help. She states she is overwhelmed by life because she is living with her daughter and her daughter is taking care of her. She reports that she goes on binges and disappears for 24 hours at a time and that she's using $100-150 dollars of crack daily. She endorses worsening depression and hopelessness. She denies HI and AVH. She has one previous attempt in 2009 in which she slit her wrists and was hospitalized at [facility] then did outpatient treatment at [facility] until 2012. She states she is hardly sleeping at night, but sleeping all day, she has lost 10-15 lbs, suffers from decreased concentration and memory loss.   While a patient in this hospital, Paula Mullins was enrolled in group counseling and activities as well as received the following medication Current facility-administered medications:acetaminophen (TYLENOL) tablet 650 mg, 650 mg, Oral, Q6H PRN, Fransisca Kaufmann, NP;  alum & mag hydroxide-simeth (MAALOX/MYLANTA) 200-200-20 MG/5ML suspension 30 mL, 30 mL, Oral, Q4H PRN, Fransisca Kaufmann, NP;   escitalopram (LEXAPRO) tablet 10 mg, 10 mg, Oral, Daily, Fransisca Kaufmann, NP, 10 mg at 07/10/12 4401;  hydrOXYzine (ATARAX/VISTARIL) tablet 25 mg, 25 mg, Oral, Q6H PRN, Fransisca Kaufmann, NP, 25 mg at 07/08/12 2136 magnesium hydroxide (MILK OF MAGNESIA) suspension 30 mL, 30 mL, Oral, Daily PRN, Fransisca Kaufmann, NP;  traZODone (DESYREL) tablet 50 mg, 50 mg, Oral, QHS PRN, Fransisca Kaufmann, NP, 50 mg at 07/09/12 2130 The patient was not taking any medication for her mental health prior to admission. She was started on Lexapro 5mg  which was titrated to 10 mg to help treat her depressive symptoms. The patient was ordered Trazodone 50 mg at bedtime to help improve quality of sleep. She actively attended the scheduled groups and worked to develop coping skills to deal with her depression and abstain from drug use. Paula Mullins has felt very depressed and ashamed regarding her relapse and inability to stop use again of cocaine. Patient has a young child that she need to care for while receiving her treatment. There were no behavior problems noted during her stay or medical issues that needed addressing. Patient attended treatment team meeting this am and met with treatment team members. Pt symptoms, treatment plan and response to treatment discussed. Paula Mullins endorsed that their symptoms have improved. She will be staying with her daughter in [place] until she gets accepted to [facility] for substance abuse treatment and housing. She has follow up scheduled at [facility] for medication management and therapy.  Pt also stated that they are stable for discharge.  In other to control Active Problems:   * No active hospital problems. * , they will continue psychiatric care on outpatient basis. They will follow-up at  Follow-up Information   Follow up with [facility] On 07/11/2012. (Walk in on this date.  Walk in clinic is Monday - Friday between 8AM-3PM for medication managment)    Contact information:   [address] [place], [place]   [address] Phone: [phone] Fax: [phone]    .  In addition they were instructed to take all your medications as prescribed by your mental healthcare provider, to report any adverse effects and or reactions from your medicines to your outpatient provider promptly, patient is instructed and cautioned to not engage in alcohol and or illegal drug use while on prescription medicines, in the event of worsening symptoms, patient is instructed to call the crisis hotline, 911 and or go to the nearest ED for appropriate evaluation and treatment of symptoms.   Upon discharge, patient adamantly denies suicidal, homicidal ideations, auditory, visual hallucinations and or delusional thinking. They left [facility] with all personal belongings in no apparent distress.  Consults:  See electronic record for details  Significant Diagnostic Studies:  See electronic record for details  Discharge Vitals:   Blood pressure 115/65, pulse 69, temperature 97.9 F (36.6 C), temperature source Oral, resp. rate 18, height 5\' 3"  (1.6 m), weight 63.504 kg (140 lb), last menstrual period 07/03/2012..  Mental Status Exam: See Mental Status Examination and Suicide Risk Assessment completed by Attending Physician prior to discharge.  Discharge destination:  Home  Is patient on multiple antipsychotic therapies at discharge:  No  Has Patient had three or more failed trials of antipsychotic monotherapy by history: N/A Recommended Plan for Multiple Antipsychotic Therapies: N/A Discharge Orders   Future Orders Complete By Expires     Activity as tolerated - No restrictions  As directed         Medication List       Indication   escitalopram 10 MG tablet  Commonly known as:  LEXAPRO  Take 1 tablet (10 mg total) by mouth daily. For depression.   Indication:  Depression     hydrOXYzine 25 MG tablet  Commonly known as:  ATARAX/VISTARIL  Take 1 tablet (25 mg total) by mouth every 6 (six) hours as needed for anxiety.       traZODone 50 MG tablet  Commonly known as:  DESYREL  Take 1 tablet (50 mg total) by mouth at bedtime as needed for sleep.   Indication:  Trouble Sleeping, Major Depressive Disorder           Follow-up Information   Follow up with [facility] On 07/11/2012. (Walk in on this date.  Walk in clinic is Monday - Friday between 8AM-3PM for medication managment)    Contact information:   [address] [place], [place]  [address] Phone: [phone] Fax: [phone]     Follow-up recommendations:   Activities: Resume typical activities Diet: Resume typical diet Tests: none Other: Follow up with outpatient provider and report any side effects to out patient prescriber. Continue to work the relapse prevention plan Comments:  Take all your medications as prescribed by your mental healthcare provider. Report any adverse effects and or reactions from your medicines to your outpatient provider promptly. Patient is instructed and cautioned to not engage in alcohol and or illegal drug use while on prescription medicines. In the event of worsening symptoms, patient  is instructed to call the crisis hotline, 911 and or go to the nearest ED for appropriate evaluation and treatment of symptoms. Follow-up with your primary care provider for your other medical issues, concerns and or health care needs.  SignedFransisca Kaufmann NP-C 07/10/2012 10:11 AM Agree with assessment and plan Reymundo Poll. Dub Mikes, M.D.

## 2012-07-10 NOTE — Progress Notes (Signed)
Discharge note: Pt discharged to self.Pt denies SI and depression at this time. Pt received both written and verbal discharge instructions. Pt agreed to f/u appt and med regimen.  Pt received all belongings from room and locker. Pt received sample meds and prescriptions. Pt safely left the facility.

## 2012-07-10 NOTE — BHH Group Notes (Signed)
[facility] LCSW Aftercare Discharge Planning Group Note   07/10/2012  8:45 AM  Participation Quality:  Alert and Appropriate   Mood/Affect:  Appropriate  Depression Rating:  0  Anxiety Rating:  0  Thoughts of Suicide:  Pt denies SI/HI  Will you contract for safety?   Yes  Current AVH:  Pt denies  Plan for Discharge/Comments:  Pt attended discharge planning group and actively participated in group.  CSW provided pt with today's workbook.  Pt reports feeling ready to d/c today.  Pt states that she plans to stay in [place] with her daughter until she gets accepted to [facility] for longer treatment and housing.  Pt has follow up scheduled at [facility] for medication management and therapy. No further needs voiced by pt at this time.    Transportation Means: Pt reports having access to transportation - provided pt a bus pass (in chart for d/c)  Supports: Pt names her fiance as supportive  Reyes Ivan, LCSWA 07/10/2012 10:13 AM

## 2012-07-10 NOTE — Progress Notes (Signed)
[facility] Adult Case Management Discharge Plan :  Will you be returning to the same living situation after discharge: Yes,  returning home At discharge, do you have transportation home?:Yes,  access to transportation, provided bus pass Do you have the ability to pay for your medications:Yes,  access to meds  Release of information consent forms completed and in the chart;  Patient's signature needed at discharge.  Patient to Follow up at: Follow-up Information   Follow up with [facility] On 07/11/2012. (Walk in on this date.  Walk in clinic is Monday - Friday between 8AM-3PM for medication managment)    Contact information:   [address] [place], [place]  [address] Phone: [phone] Fax: [phone]      Patient denies SI/HI:   Yes,  denies SI/HI    Safety Planning and Suicide Prevention discussed:  Yes,  discussed with pt, unable to reach pt's fiance, see suicide prevention note.  Carmina Miller 07/10/2012, 10:14 AM

## 2012-07-13 NOTE — Progress Notes (Signed)
Patient Discharge Instructions:  After Visit Summary (AVS):   Faxed to:  07/13/12 Discharge Summary Note:   Faxed to:  07/13/12 Psychiatric Admission Assessment Note:   Faxed to:  07/13/12 Suicide Risk Assessment - Discharge Assessment:   Faxed to:  07/13/12 Faxed/Sent to the Next Level Care provider:  07/13/12 Faxed to [facility] @ [phone]  Jerelene Redden, 07/13/2012, 2:50 PM

## 2012-08-30 ENCOUNTER — Other Ambulatory Visit: Payer: Self-pay | Admitting: Obstetrics and Gynecology

## 2012-08-30 DIAGNOSIS — Z1231 Encounter for screening mammogram for malignant neoplasm of breast: Secondary | ICD-10-CM

## 2012-09-07 ENCOUNTER — Encounter (HOSPITAL_COMMUNITY): Payer: Self-pay

## 2012-09-07 ENCOUNTER — Emergency Department (HOSPITAL_COMMUNITY)
Admission: EM | Admit: 2012-09-07 | Discharge: 2012-09-08 | Disposition: A | Discharge status: Home / Self Care | Payer: Self-pay | Attending: Emergency Medicine | Admitting: Emergency Medicine

## 2012-09-07 DIAGNOSIS — Z8659 Personal history of other mental and behavioral disorders: Secondary | ICD-10-CM | POA: Insufficient documentation

## 2012-09-07 DIAGNOSIS — G43909 Migraine, unspecified, not intractable, without status migrainosus: Secondary | ICD-10-CM | POA: Insufficient documentation

## 2012-09-07 NOTE — ED Notes (Signed)
Pt c/o posterior headache starting approx 1100 this am and vomiting starting 1 hour ago

## 2012-09-08 MED ORDER — KETOROLAC TROMETHAMINE 30 MG/ML IJ SOLN
30.0000 mg | Freq: Once | INTRAMUSCULAR | Status: AC
  Administered 2012-09-08: 30 mg via INTRAVENOUS
  Filled 2012-09-08: qty 1

## 2012-09-08 MED ORDER — DIPHENHYDRAMINE HCL 50 MG/ML IJ SOLN
25.0000 mg | Freq: Once | INTRAMUSCULAR | Status: AC
  Administered 2012-09-08: 25 mg via INTRAVENOUS
  Filled 2012-09-08: qty 1

## 2012-09-08 MED ORDER — SODIUM CHLORIDE 0.9 % IV BOLUS (SEPSIS)
1000.0000 mL | Freq: Once | INTRAVENOUS | Status: AC
  Administered 2012-09-08: 1000 mL via INTRAVENOUS

## 2012-09-08 MED ORDER — METOCLOPRAMIDE HCL 5 MG/ML IJ SOLN
10.0000 mg | Freq: Once | INTRAMUSCULAR | Status: AC
  Administered 2012-09-08: 10 mg via INTRAVENOUS
  Filled 2012-09-08: qty 2

## 2012-09-08 NOTE — ED Provider Notes (Signed)
Patient signed out to me at shift change. Patient with typical for her migraine with nausea and vomiting which only began an hour ago. Patient was seen and examined by PA Sciacca, medications are ordered. Plan to reassess if improved to discharge home with followup.  2:00 AM Patient reassessed in his headache free. Patient feels well and wants to be discharged home. Patient will be discharged home with followup as needed.  Exam: Pt appears sleepy, but in no acute distress. PERRLA, neck is supple. Regular HR and rhythm, lungs are clear bilaterally. Normal gait. Able to move all 4 extremities.   Filed Vitals:   09/08/12 0115 09/08/12 0130 09/08/12 0145 09/08/12 0200  BP: 111/67 107/61 102/60 101/55  Pulse: 71 69 69 68  Temp:      TempSrc:      Resp:      Height:      Weight:      SpO2: 99% 99% 99% 98%       Briany Aye A Deatrice Spanbauer, PA-C 09/08/12 0330

## 2012-09-08 NOTE — ED Provider Notes (Signed)
CSN: [id number]     Arrival date & time 09/07/12  2211 History   First MD Initiated Contact with Patient 09/07/12 2356     Chief Complaint  Patient presents with  . Headache   (Consider location/radiation/quality/duration/timing/severity/associated sxs/prior Treatment) The history is provided by the patient. No language interpreter was used.  Paula Mullins is a 42 year old female with past medical history of sleep apnea, anxiety presenting to emergency apartment with headache that started approximately o'clock this morning. Patient reported that the headache is gotten progressively worse as the day has continued. Patient reported that the headache is localized to the left side of her face, described as a constant throbbing sensation mainly localized to behind the eye with radiation to the posterior aspect of the neck. Associated symptoms are mild blurred vision, photophobia, nausea. Patient reported that light makes the discomfort worse with heat making it better. Patient reported that she's been using Excedrin, last dose was at 11:30 this morning-denied any relief. Patient reports that she normally gets headaches, migraines at least once per month associated with her menstrual cycle-according to patient normal symptoms are left-sided discomfort with radiation to the back of her neck, pain behind the eye, photophobia, nausea. Patient reported the only difference in this episode of migraine was that she's had 2 episodes of emesis, denied blood and bile. Denied chest pain, shortness of breath, difficulty breathing, worse headache of life, thunderclap onset, neck stiffness, fever, chills, cough, nasal congestion, photophobia, numbness, tingling, sudden loss of vision, spots in vision. PCP none  Past Medical History  Diagnosis Date  . Sleep apnea   . Anxiety     Pt reports anxiety   Past Surgical History  Procedure Laterality Date  . Cesarean section    . No past surgeries      C-section    History reviewed. No pertinent family history. History  Substance Use Topics  . Smoking status: Never Smoker   . Smokeless tobacco: Not on file  . Alcohol Use: Yes     Comment: 2 shots once a month   OB History   Grav Para Term Preterm Abortions TAB SAB Ect Mult Living                 Review of Systems  Constitutional: Negative for fever and chills.  HENT: Positive for sore throat and neck pain. Negative for trouble swallowing.   Eyes: Negative for visual disturbance.  Respiratory: Negative for chest tightness and shortness of breath.   Cardiovascular: Negative for chest pain.  Gastrointestinal: Positive for nausea and vomiting. Negative for abdominal pain, diarrhea and blood in stool.  Genitourinary: Negative for hematuria.  Musculoskeletal: Negative for back pain.  Neurological: Positive for headaches. Negative for dizziness, weakness and numbness.  All other systems reviewed and are negative.    Allergies  Flagyl  Home Medications   Current Outpatient Rx  Name  Route  Sig  Dispense  Refill  . aspirin-acetaminophen-caffeine (EXCEDRIN EXTRA STRENGTH) 250-250-65 MG per tablet   Oral   Take 2 tablets by mouth 3 (three) times daily as needed for pain.         . Multiple Vitamin (MULTIVITAMIN WITH MINERALS) TABS tablet   Oral   Take 1 tablet by mouth daily.          BP 129/90  Pulse 70  Temp(Src) 98 F (36.7 C) (Oral)  Resp 16  Ht 5\' 4"  (1.626 m)  Wt 155 lb (70.308 kg)  BMI 26.59 kg/m2  SpO2  98%  LMP 08/19/2012 Physical Exam  Nursing note and vitals reviewed. Constitutional: She is oriented to person, place, and time. She appears well-developed and well-nourished. No distress.  Upon arrival into the room patient found laying in bed sleeping comfortably  HENT:  Head: Normocephalic and atraumatic.  Mouth/Throat: Oropharynx is clear and moist. No oropharyngeal exudate.  Negative erythema, inflammation, swelling, exudate, petechiae noted to the posterior  oropharynx. Negative signs of peritonsillar abscess. Uvula midline, symmetrical elevation. Negative tonsillar swelling, exudate noted to the tonsils.  Eyes: Conjunctivae are normal. Pupils are equal, round, and reactive to light. Right eye exhibits no discharge. Left eye exhibits no discharge.  Photophobia identified  Neck: Normal range of motion. Neck supple. No tracheal deviation present.  Discomfort upon palpation to the posterior aspect of the neck, muscular nature  Cardiovascular: Normal rate, regular rhythm and normal heart sounds.  Exam reveals no friction rub.   No murmur heard. Pulses:      Radial pulses are 2+ on the right side, and 2+ on the left side.       Dorsalis pedis pulses are 2+ on the right side, and 2+ on the left side.  Pulmonary/Chest: Effort normal and breath sounds normal. No respiratory distress. She has no wheezes. She has no rales.  Musculoskeletal: Normal range of motion.  Lymphadenopathy:    She has no cervical adenopathy.  Neurological: She is alert and oriented to person, place, and time. No cranial nerve deficit. She exhibits normal muscle tone. Coordination normal.  Cranial nerves III through XII grossly intact Strength 5+/5+ with resistance to upper and lower extremities bilaterally, equal distribution Sensation intact upper and lower extremities bilaterally with differentiation to sharp and dull touch  Skin: Skin is warm and dry. No rash noted. She is not diaphoretic. No erythema.  Psychiatric: She has a normal mood and affect. Her behavior is normal. Thought content normal.    ED Course  Procedures (including critical care time) Labs Review Labs Reviewed - No data to display Imaging Review No results found.  MDM  No diagnosis found.  Patient presenting to emergency department with migraine that started this morning has progressively gotten worse. As per patient, reported that she gets the least one of migraine per month associated with menstrual  cycles. Patient reported that she normally presents in this manner, denied any new symptoms except for emesis. Patient reported that she's had 2 episodes of emesis. Denied maintenance therapy for migraines. Patient been complaining about sore throat for the past couple of days, reported that which looked in her mouth the other day she is a white specks on her tonsils. Alert and oriented. Cranial nerves III through XII grossly intact. Sensation intact upper lower extremities. Strength intact. Pulses palpable. Negative neurological deficits identified. Patient responds to questions properly, responds appropriately. Follows commands. Negative erythema, swelling, exudate noted to the tonsils-negative swelling, erythema, inflammation noted to the posterior oropharynx-negative petechiae identified. Uvula midline symmetrical elevation. Negative signs of peritonsillar abscess. Doubt SAH. Doubt traumatic neurological deficit. Suspicion to be migraine episode. Patient given IV fluids and IV medications for pain relief. Discussed case with Jaynie Crumble, PA-C. Transfer of care to [facility], PA-C at change in shift.     Raymon Mutton, PA-C 09/09/12 0207

## 2012-09-09 NOTE — ED Provider Notes (Signed)
Medical screening examination/treatment/procedure(s) were performed by non-physician practitioner and as supervising physician I was immediately available for consultation/collaboration.   Junius Argyle, MD 09/09/12 2043

## 2012-09-10 NOTE — ED Provider Notes (Signed)
Medical screening examination/treatment/procedure(s) were performed by non-physician practitioner and as supervising physician I was immediately available for consultation/collaboration.   Julie Manly, MD 09/10/12 2300 

## 2012-09-19 ENCOUNTER — Ambulatory Visit (HOSPITAL_COMMUNITY)
Admission: RE | Admit: 2012-09-19 | Discharge: 2012-09-19 | Disposition: A | Discharge status: Home / Self Care | Payer: Self-pay | Source: Ambulatory Visit | Attending: Obstetrics and Gynecology | Admitting: Obstetrics and Gynecology

## 2012-09-19 ENCOUNTER — Ambulatory Visit (HOSPITAL_COMMUNITY): Payer: Self-pay

## 2012-09-19 DIAGNOSIS — Z1231 Encounter for screening mammogram for malignant neoplasm of breast: Secondary | ICD-10-CM

## 2013-04-17 ENCOUNTER — Ambulatory Visit: Payer: Self-pay

## 2013-07-09 LAB — BASIC METABOLIC PANEL
Creatinine: 1 mg/dL (ref 0.5–1.1)
GLUCOSE: 99 mg/dL

## 2013-07-09 LAB — HEPATIC FUNCTION PANEL
Bilirubin, Direct: 0.11 mg/dL (ref 0.01–0.4)
Bilirubin, Total: 0.6 mg/dL

## 2013-07-09 LAB — LIPID PANEL
Cholesterol: 169 mg/dL (ref 0–200)
HDL: 63 mg/dL (ref 35–70)
LDL CALC: 59 mg/dL

## 2013-07-09 LAB — CBC AND DIFFERENTIAL
HEMATOCRIT: 34 % — AB (ref 36–46)
HEMOGLOBIN: 11.3 g/dL — AB (ref 12.0–16.0)
Platelets: 233 10*3/uL (ref 150–399)

## 2013-07-09 LAB — TSH: TSH: 4.02 u[IU]/mL (ref 0.41–5.90)

## 2013-07-09 LAB — HEMOGLOBIN A1C: HEMOGLOBIN A1C: 6.1

## 2013-08-04 ENCOUNTER — Emergency Department: Payer: Self-pay | Admitting: Emergency Medicine

## 2013-10-09 LAB — HM PAP SMEAR: HM PAP: NEGATIVE

## 2013-12-31 ENCOUNTER — Emergency Department: Payer: Self-pay | Admitting: Internal Medicine

## 2014-08-01 ENCOUNTER — Ambulatory Visit: Payer: Medicaid Other | Admitting: Family Medicine

## 2014-09-05 ENCOUNTER — Other Ambulatory Visit: Payer: Self-pay | Admitting: Nurse Practitioner

## 2014-09-05 ENCOUNTER — Ambulatory Visit: Payer: Medicaid Other | Admitting: Family Medicine

## 2014-10-15 ENCOUNTER — Encounter: Payer: Self-pay | Admitting: Family Medicine

## 2014-10-15 ENCOUNTER — Ambulatory Visit (INDEPENDENT_AMBULATORY_CARE_PROVIDER_SITE_OTHER): Payer: Medicaid Other | Admitting: Family Medicine

## 2014-10-15 VITALS — BP 128/80 | HR 65 | Temp 97.7°F | Ht 63.2 in | Wt 145.0 lb

## 2014-10-15 DIAGNOSIS — Z23 Encounter for immunization: Secondary | ICD-10-CM

## 2014-10-15 DIAGNOSIS — D649 Anemia, unspecified: Secondary | ICD-10-CM | POA: Diagnosis not present

## 2014-10-15 DIAGNOSIS — Z113 Encounter for screening for infections with a predominantly sexual mode of transmission: Secondary | ICD-10-CM

## 2014-10-15 DIAGNOSIS — G43009 Migraine without aura, not intractable, without status migrainosus: Secondary | ICD-10-CM

## 2014-10-15 DIAGNOSIS — D509 Iron deficiency anemia, unspecified: Secondary | ICD-10-CM | POA: Insufficient documentation

## 2014-10-15 DIAGNOSIS — R7301 Impaired fasting glucose: Secondary | ICD-10-CM | POA: Insufficient documentation

## 2014-10-15 DIAGNOSIS — R7309 Other abnormal glucose: Secondary | ICD-10-CM | POA: Insufficient documentation

## 2014-10-15 DIAGNOSIS — Z13220 Encounter for screening for lipoid disorders: Secondary | ICD-10-CM

## 2014-10-15 DIAGNOSIS — Z1239 Encounter for other screening for malignant neoplasm of breast: Secondary | ICD-10-CM | POA: Diagnosis not present

## 2014-10-15 DIAGNOSIS — G43909 Migraine, unspecified, not intractable, without status migrainosus: Secondary | ICD-10-CM | POA: Insufficient documentation

## 2014-10-15 LAB — UA/M W/RFLX CULTURE, ROUTINE
BILIRUBIN UA: NEGATIVE
GLUCOSE, UA: NEGATIVE
KETONES UA: NEGATIVE
Leukocytes, UA: NEGATIVE
Nitrite, UA: NEGATIVE
PROTEIN UA: NEGATIVE
RBC, UA: NEGATIVE
Specific Gravity, UA: 1.005 (ref 1.005–1.030)
UUROB: 0.2 mg/dL (ref 0.2–1.0)
pH, UA: 5 (ref 5.0–7.5)

## 2014-10-15 LAB — CBC WITH DIFFERENTIAL/PLATELET
HEMATOCRIT: 36 % (ref 34.0–46.6)
Hemoglobin: 12.4 g/dL (ref 11.1–15.9)
LYMPHS ABS: 1.6 10*3/uL (ref 0.7–3.1)
LYMPHS: 21 %
MCH: 31 pg (ref 26.6–33.0)
MCHC: 34.4 g/dL (ref 31.5–35.7)
MCV: 90 fL (ref 79–97)
MID (ABSOLUTE): 0.5 10*3/uL (ref 0.1–1.6)
MID: 6 %
NEUTROS ABS: 5.8 10*3/uL (ref 1.4–7.0)
NEUTROS PCT: 73 %
PLATELETS: 240 10*3/uL (ref 150–379)
RBC: 4 x10E6/uL (ref 3.77–5.28)
RDW: 12.7 % (ref 12.3–15.4)
WBC: 7.9 10*3/uL (ref 3.4–10.8)

## 2014-10-15 LAB — BAYER DCA HB A1C WAIVED: HB A1C (BAYER DCA - WAIVED): 6.2 % (ref <7.0)

## 2014-10-15 MED ORDER — AMITRIPTYLINE HCL 25 MG PO TABS: 25.0000 mg | ORAL_TABLET | Freq: Every day | ORAL | Status: DC

## 2014-10-15 NOTE — Assessment & Plan Note (Signed)
Not under good control. Will have her start amitriptyline and follow up in 1 month to see how she is doing. Continue naproxen for migraine relief as it is working.

## 2014-10-15 NOTE — Assessment & Plan Note (Signed)
Normal Hgb today. Await iron studies and B12/folate. With low hgb, would not allow her to donate 2x a week.

## 2014-10-15 NOTE — Patient Instructions (Addendum)
Recurrent Migraine Headache A migraine headache is very bad, throbbing pain on one or both sides of your head. Recurrent migraines keep coming back. Talk to your doctor about what things may bring on (trigger) your migraine headaches. HOME CARE  Only take medicines as told by your doctor.  Lie down in a dark, quiet room when you have a migraine.  Keep a journal to find out if certain things bring on migraine headaches. For example, write down:  What you eat and drink.  How much sleep you get.  Any change to your diet or medicines.  Lessen how much alcohol you drink.  Quit smoking if you smoke.  Get enough sleep.  Lessen any stress in your life.  Keep lights dim if bright lights bother you or make your migraines worse. GET HELP IF:  Medicine does not help your migraines.  Your pain keeps coming back.  You have a fever. GET HELP RIGHT AWAY IF:   Your migraine becomes really bad.  You have a stiff neck.  You have trouble seeing.  Your muscles are weak, or you lose muscle control.  You lose your balance or have trouble walking.  You feel like you will pass out (faint), or you pass out.  You have really bad symptoms that are different than your first symptoms. MAKE SURE YOU:   Understand these instructions.  Will watch your condition.  Will get help right away if you are not doing well or get worse.   This information is not intended to replace advice given to you by your health care provider. Make sure you discuss any questions you have with your health care provider.   Document Released: 09/29/2007 Document Revised: 12/25/2012 Document Reviewed: 08/27/2012 Elsevier Interactive Patient Education 2016 [employer]. Anemia, Nonspecific Anemia is a condition in which the concentration of red blood cells or hemoglobin in the blood is below normal. Hemoglobin is a substance in red blood cells that carries oxygen to the tissues of the body. Anemia results in not  enough oxygen reaching these tissues.  CAUSES  Common causes of anemia include:   Excessive bleeding. Bleeding may be internal or external. This includes excessive bleeding from periods (in women) or from the intestine.   Poor nutrition.   Chronic kidney, thyroid, and liver disease.  Bone marrow disorders that decrease red blood cell production.  Cancer and treatments for cancer.  HIV, AIDS, and their treatments.  Spleen problems that increase red blood cell destruction.  Blood disorders.  Excess destruction of red blood cells due to infection, medicines, and autoimmune disorders. SIGNS AND SYMPTOMS   Minor weakness.   Dizziness.   Headache.  Palpitations.   Shortness of breath, especially with exercise.   Paleness.  Cold sensitivity.  Indigestion.  Nausea.  Difficulty sleeping.  Difficulty concentrating. Symptoms may occur suddenly or they may develop slowly.  DIAGNOSIS  Additional blood tests are often needed. These help your health care provider determine the best treatment. Your health care provider will check your stool for blood and look for other causes of blood loss.  TREATMENT  Treatment varies depending on the cause of the anemia. Treatment can include:   Supplements of iron, vitamin B12, or folic acid.   Hormone medicines.   A blood transfusion. This may be needed if blood loss is severe.   Hospitalization. This may be needed if there is significant continual blood loss.   Dietary changes.  Spleen removal. HOME CARE INSTRUCTIONS Keep all follow-up appointments. It  often takes many weeks to correct anemia, and having your health care provider check on your condition and your response to treatment is very important. SEEK IMMEDIATE MEDICAL CARE IF:   You develop extreme weakness, shortness of breath, or chest pain.   You become dizzy or have trouble concentrating.  You develop heavy vaginal bleeding.   You develop a rash.    You have bloody or black, tarry stools.   You faint.   You vomit up blood.   You vomit repeatedly.   You have abdominal pain.  You have a fever or persistent symptoms for more than 2-3 days.   You have a fever and your symptoms suddenly get worse.   You are dehydrated.  MAKE SURE YOU:  Understand these instructions.  Will watch your condition.  Will get help right away if you are not doing well or get worse.   This information is not intended to replace advice given to you by your health care provider. Make sure you discuss any questions you have with your health care provider.   Document Released: 01/28/2004 Document Revised: 08/22/2012 Document Reviewed: 06/15/2012 Elsevier Interactive Patient Education [employer] Inc. Sexually Transmitted Disease A sexually transmitted disease (STD) is a disease or infection that may be passed (transmitted) from person to person, usually during sexual activity. This may happen by way of saliva, semen, blood, vaginal mucus, or urine. Common STDs include:  Gonorrhea.  Chlamydia.  Syphilis.  HIV and AIDS.  Genital herpes.  Hepatitis B and C.  Trichomonas.  Human papillomavirus (HPV).  Pubic lice.  Scabies.  Mites.  Bacterial vaginosis. WHAT ARE CAUSES OF STDs? An STD may be caused by bacteria, a virus, or parasites. STDs are often transmitted during sexual activity if one person is infected. However, they may also be transmitted through nonsexual means. STDs may be transmitted after:   Sexual intercourse with an infected person.  Sharing sex toys with an infected person.  Sharing needles with an infected person or using unclean piercing or tattoo needles.  Having intimate contact with the genitals, mouth, or rectal areas of an infected person.  Exposure to infected fluids during birth. WHAT ARE THE SIGNS AND SYMPTOMS OF STDs? Different STDs have different symptoms. Some people may not have any  symptoms. If symptoms are present, they may include:  Painful or bloody urination.  Pain in the pelvis, abdomen, vagina, anus, throat, or eyes.  A skin rash, itching, or irritation.  Growths, ulcerations, blisters, or sores in the genital and anal areas.  Abnormal vaginal discharge with or without bad odor.  Penile discharge in men.  Fever.  Pain or bleeding during sexual intercourse.  Swollen glands in the groin area.  Yellow skin and eyes (jaundice). This is seen with hepatitis.  Swollen testicles.  Infertility.  Sores and blisters in the mouth. HOW ARE STDs DIAGNOSED? To make a diagnosis, your health care provider may:  Take a medical history.  Perform a physical exam.  Take a sample of any discharge to examine.  Swab the throat, cervix, opening to the penis, rectum, or vagina for testing.  Test a sample of your first morning urine.  Perform blood tests.  Perform a Pap test, if this applies.  Perform a colposcopy.  Perform a laparoscopy. HOW ARE STDs TREATED? Treatment depends on the STD. Some STDs may be treated but not cured.  Chlamydia, gonorrhea, trichomonas, and syphilis can be cured with antibiotic medicine.  Genital herpes, hepatitis, and HIV can be treated,  but not cured, with prescribed medicines. The medicines lessen symptoms.  Genital warts from HPV can be treated with medicine or by freezing, burning (electrocautery), or surgery. Warts may come back.  HPV cannot be cured with medicine or surgery. However, abnormal areas may be removed from the cervix, vagina, or vulva.  If your diagnosis is confirmed, your recent sexual partners need treatment. This is true even if they are symptom-free or have a negative culture or evaluation. They should not have sex until their health care providers say it is okay.  Your health care provider may test you for infection again 3 months after treatment. HOW CAN I REDUCE MY RISK OF GETTING AN STD? Take these  steps to reduce your risk of getting an STD:  Use latex condoms, dental dams, and water-soluble lubricants during sexual activity. Do not use petroleum jelly or oils.  Avoid having multiple sex partners.  Do not have sex with someone who has other sex partners  Do not have sex with anyone you do not know or who is at high risk for an STD.  Avoid risky sex practices that can break your skin.  Do not have sex if you have open sores on your mouth or skin.  Avoid drinking too much alcohol or taking illegal drugs. Alcohol and drugs can affect your judgment and put you in a vulnerable position.  Avoid engaging in oral and anal sex acts.  Get vaccinated for HPV and hepatitis. If you have not received these vaccines in the past, talk to your health care provider about whether one or both might be right for you.  If you are at risk of being infected with HIV, it is recommended that you take a prescription medicine daily to prevent HIV infection. This is called pre-exposure prophylaxis (PrEP). You are considered at risk if:  You are a man who has sex with other men (MSM).  You are a heterosexual man or woman and are sexually active with more than one partner.  You take drugs by injection.  You are sexually active with a partner who has HIV.  Talk with your health care provider about whether you are at high risk of being infected with HIV. If you choose to begin PrEP, you should first be tested for HIV. You should then be tested every 3 months for as long as you are taking PrEP. WHAT SHOULD I DO IF I THINK I HAVE AN STD?  See your health care provider.  Tell your sexual partner(s). They should be tested and treated for any STDs.  Do not have sex until your health care provider says it is okay. WHEN SHOULD I GET IMMEDIATE MEDICAL CARE? Contact your health care provider right away if:   You have severe abdominal pain.  You are a man and notice swelling or pain in your testicles.  You  are a woman and notice swelling or pain in your vagina.   This information is not intended to replace advice given to you by your health care provider. Make sure you discuss any questions you have with your health care provider.   Document Released: 03/12/2002 Document Revised: 01/10/2014 Document Reviewed: 07/10/2012 Elsevier Interactive Patient Education 2016 [employer]. Diabetes Mellitus and Food It is important for you to manage your blood sugar (glucose) level. Your blood glucose level can be greatly affected by what you eat. Eating healthier foods in the appropriate amounts throughout the day at about the same time each day will help you  control your blood glucose level. It can also help slow or prevent worsening of your diabetes mellitus. Healthy eating may even help you improve the level of your blood pressure and reach or maintain a healthy weight.  General recommendations for healthful eating and cooking habits include:  Eating meals and snacks regularly. Avoid going long periods of time without eating to lose weight.  Eating a diet that consists mainly of plant-based foods, such as fruits, vegetables, nuts, legumes, and whole grains.  Using low-heat cooking methods, such as baking, instead of high-heat cooking methods, such as deep frying. Work with your dietitian to make sure you understand how to use the Nutrition Facts information on food labels. HOW CAN FOOD AFFECT ME? Carbohydrates Carbohydrates affect your blood glucose level more than any other type of food. Your dietitian will help you determine how many carbohydrates to eat at each meal and teach you how to count carbohydrates. Counting carbohydrates is important to keep your blood glucose at a healthy level, especially if you are using insulin or taking certain medicines for diabetes mellitus. Alcohol Alcohol can cause sudden decreases in blood glucose (hypoglycemia), especially if you use insulin or take certain medicines  for diabetes mellitus. Hypoglycemia can be a life-threatening condition. Symptoms of hypoglycemia (sleepiness, dizziness, and disorientation) are similar to symptoms of having too much alcohol.  If your health care provider has given you approval to drink alcohol, do so in moderation and use the following guidelines:  Women should not have more than one drink per day, and men should not have more than two drinks per day. One drink is equal to:  12 oz of beer.  5 oz of wine.  1 oz of hard liquor.  Do not drink on an empty stomach.  Keep yourself hydrated. Have water, diet soda, or unsweetened iced tea.  Regular soda, juice, and other mixers might contain a lot of carbohydrates and should be counted. WHAT FOODS ARE NOT RECOMMENDED? As you make food choices, it is important to remember that all foods are not the same. Some foods have fewer nutrients per serving than other foods, even though they might have the same number of calories or carbohydrates. It is difficult to get your body what it needs when you eat foods with fewer nutrients. Examples of foods that you should avoid that are high in calories and carbohydrates but low in nutrients include:  Trans fats (most processed foods list trans fats on the Nutrition Facts label).  Regular soda.  Juice.  Candy.  Sweets, such as cake, pie, doughnuts, and cookies.  Fried foods. WHAT FOODS CAN I EAT? Eat nutrient-rich foods, which will nourish your body and keep you healthy. The food you should eat also will depend on several factors, including:  The calories you need.  The medicines you take.  Your weight.  Your blood glucose level.  Your blood pressure level.  Your cholesterol level. You should eat a variety of foods, including:  Protein.  Lean cuts of meat.  Proteins low in saturated fats, such as fish, egg whites, and beans. Avoid processed meats.  Fruits and vegetables.  Fruits and vegetables that may help control  blood glucose levels, such as apples, mangoes, and yams.  Dairy products.  Choose fat-free or low-fat dairy products, such as milk, yogurt, and cheese.  Grains, bread, pasta, and rice.  Choose whole grain products, such as multigrain bread, whole oats, and brown rice. These foods may help control blood pressure.  Fats.  Foods containing healthful fats, such as nuts, avocado, olive oil, canola oil, and fish. DOES EVERYONE WITH DIABETES MELLITUS HAVE THE SAME MEAL PLAN? Because every person with diabetes mellitus is different, there is not one meal plan that works for everyone. It is very important that you meet with a dietitian who will help you create a meal plan that is just right for you.   This information is not intended to replace advice given to you by your health care provider. Make sure you discuss any questions you have with your health care provider.   Document Released: 09/16/2004 Document Revised: 01/10/2014 Document Reviewed: 11/16/2012 Elsevier Interactive Patient Education [employer]

## 2014-10-15 NOTE — Progress Notes (Signed)
BP 128/80 mmHg  Pulse 65  Temp(Src) 97.7 F (36.5 C)  Ht 5' 3.2" (1.605 m)  Wt 145 lb (65.772 kg)  BMI 25.53 kg/m2  SpO2 98%  LMP 10/01/2014 (Approximate)   Subjective:    Patient ID: Paula Mullins, female    DOB: 06/18/1970, 44 y.o.   MRN: [id number]  HPI: Paula RegesSheila A Conaway is a 44 y.o. female  Chief Complaint  Patient presents with  . STD Screening  . Migraine  . Diabetes    [facility], she was a prediabetic, she has not been in over a year so she would like this checked. She is having blurred vision  . Anemia    Patient has a history of Anemia   Impaired Fasting Glucose- has been over a year since she's been checked HbA1C: 6.2 today Duration of elevated blood sugar: unsure Polydipsia: yes Polyuria: yes Weight change: no Visual disturbance: yes- blurry, for about 6 months Glucose Monitoring: no Diabetic Education: Completed Family history of diabetes: no  MIGRAINES- has had them since she was younger, last saw eye doctor about a year ago, around her menses Duration: chronic Onset: sudden Severity: moderate Quality: sharp and stabbing Frequency: a few times a month Location: right behind R temple Headache duration: 2-3 days Radiation: yes into her face Time of day headache occurs: variable Alleviating factors: naproxen Aggravating factors: light Headache status at time of visit: asymptomatic Treatments attempted: Treatments attempted: APAP, ibuprofen and aleve", excedrine   Aura: yes Nausea:  yes Vomiting: no Photophobia:  yes Phonophobia:  yes Effect on social functioning:  yes Confusion:  no Gait disturbance/ataxia:  no Behavioral changes:  no Fevers:  no  ANEMIA- was bad with last son Anemia status: unknown, had with her last pregnancy Iron supplementation side effects: not taking any Severity of anemia: moderate Fatigue: yes Decreased exercise tolerance: no  Dyspnea on exertion: no Palpitations: no Bleeding:  no Pica: yes  STD SCREENING Sexual activity:  Practices careful partner selection, always uses condoms Contraception: no Recent unprotected intercourse: yes History of sexually transmitted diseases: yes Previous sexually transmitted disease screening: yes Genital lesions: no Penile discharge: no Dysuria: no Swollen lymph nodes: no Fevers: no Rash: no  Relevant past medical, surgical, family and social history reviewed and updated as indicated. Interim medical history since our last visit reviewed. Allergies and medications reviewed and updated.  Review of Systems  Constitutional: Negative.   Respiratory: Negative.   Cardiovascular: Negative.   Gastrointestinal: Negative.   Genitourinary: Negative.   Psychiatric/Behavioral: Negative.     Per HPI unless specifically indicated above     Objective:    BP 128/80 mmHg  Pulse 65  Temp(Src) 97.7 F (36.5 C)  Ht 5' 3.2" (1.605 m)  Wt 145 lb (65.772 kg)  BMI 25.53 kg/m2  SpO2 98%  LMP 10/01/2014 (Approximate)  Wt Readings from Last 3 Encounters:  10/15/14 145 lb (65.772 kg)  09/07/12 155 lb (70.308 kg)  07/05/12 140 lb (63.504 kg)    Physical Exam  Constitutional: She is oriented to person, place, and time. She appears well-developed and well-nourished. No distress.  HENT:  Head: Normocephalic and atraumatic.  Right Ear: Hearing normal.  Left Ear: Hearing normal.  Nose: Nose normal.  Eyes: Conjunctivae and lids are normal. Right eye exhibits no discharge. Left eye exhibits no discharge. No scleral icterus.  Cardiovascular: Normal rate, regular rhythm, normal heart sounds and intact distal pulses.  Exam reveals no gallop and no  friction rub.   No murmur heard. Pulmonary/Chest: Effort normal and breath sounds normal. No respiratory distress. She has no wheezes. She has no rales. She exhibits no tenderness.  Musculoskeletal: Normal range of motion.  Neurological: She is alert and oriented to person, place, and time.   Skin: Skin is warm, dry and intact. No rash noted. No erythema. No pallor.  Psychiatric: She has a normal mood and affect. Her speech is normal and behavior is normal. Judgment and thought content normal. Cognition and memory are normal.  Nursing note and vitals reviewed.   Results for orders placed or performed during the hospital encounter of 09/07/12  Rapid strep screen  Result Value Ref Range   Streptococcus, Group A Screen (Direct) POSITIVE (A) NEGATIVE      Assessment & Plan:   Problem List Items Addressed This Visit      Cardiovascular and Mediastinum   Migraine    Not under good control. Will have her start amitriptyline and follow up in 1 month to see how she is doing. Continue naproxen for migraine relief as it is working.       Relevant Medications   naproxen (NAPROSYN) 250 MG tablet   amitriptyline (ELAVIL) 25 MG tablet   Other Relevant Orders   Comprehensive metabolic panel   TSH     Endocrine   IFG (impaired fasting glucose)    A1c 6.2 today. Work on diet and exercise and recheck in 6 months.       Relevant Orders   Bayer DCA Hb A1c Waived   Comprehensive metabolic panel   TSH   UA/M w/rflx Culture, Routine     Other   Anemia    Normal Hgb today. Await iron studies and B12/folate. With low hgb, would not allow her to donate 2x a week.       Relevant Orders   CBC With Differential/Platelet   Comprehensive metabolic panel   Iron and TIBC   TSH   B12   Folate   Ferritin    Other Visit Diagnoses    Breast cancer screening    -  Primary    Mammogram checked today.    Relevant Orders    MM Digital Screening    Immunization due        Tdap and flu given today.    Relevant Orders    Flu Vaccine QUAD 36+ mos PF IM (Fluarix & Fluzone Quad PF)    Tdap vaccine greater than or equal to 7yo IM    Routine screening for STI (sexually transmitted infection)        Checking labs today. Await results.     Relevant Orders    HIV antibody    RPR     Hepatitis, Acute    HSV(herpes simplex vrs) 1+2 ab-IgG    GC/Chlamydia Probe Amp    Screening for cholesterol level        Checking labs today. Await results.     Relevant Orders    Lipid Panel w/o Chol/HDL Ratio        Follow up plan: Return in about 4 weeks (around 11/12/2014) for Physical without labs- needs [facility].

## 2014-10-15 NOTE — Assessment & Plan Note (Signed)
A1c 6.2 today. Work on diet and exercise and recheck in 6 months.

## 2014-10-16 LAB — HSV(HERPES SIMPLEX VRS) I + II AB-IGG
HSV 1 GLYCOPROTEIN G AB, IGG: 27.6 {index} — AB (ref 0.00–0.90)
HSV 2 Glycoprotein G Ab, IgG: 4.29 index — ABNORMAL HIGH (ref 0.00–0.90)

## 2014-10-16 LAB — COMPREHENSIVE METABOLIC PANEL
A/G RATIO: 1.5 (ref 1.1–2.5)
ALT: 13 IU/L (ref 0–32)
AST: 11 IU/L (ref 0–40)
Albumin: 4.1 g/dL (ref 3.5–5.5)
Alkaline Phosphatase: 54 IU/L (ref 39–117)
BILIRUBIN TOTAL: 0.9 mg/dL (ref 0.0–1.2)
BUN / CREAT RATIO: 18 (ref 9–23)
BUN: 18 mg/dL (ref 6–24)
CHLORIDE: 101 mmol/L (ref 97–108)
CO2: 23 mmol/L (ref 18–29)
Calcium: 9.5 mg/dL (ref 8.7–10.2)
Creatinine, Ser: 1.01 mg/dL — ABNORMAL HIGH (ref 0.57–1.00)
GFR calc non Af Amer: 67 mL/min/{1.73_m2} (ref >59)
GFR, EST AFRICAN AMERICAN: 77 mL/min/{1.73_m2} (ref >59)
Globulin, Total: 2.8 g/dL (ref 1.5–4.5)
Glucose: 101 mg/dL — ABNORMAL HIGH (ref 65–99)
POTASSIUM: 3.9 mmol/L (ref 3.5–5.2)
Sodium: 139 mmol/L (ref 134–144)
TOTAL PROTEIN: 6.9 g/dL (ref 6.0–8.5)

## 2014-10-16 LAB — VITAMIN B12: Vitamin B-12: 340 pg/mL (ref 211–946)

## 2014-10-16 LAB — HEPATITIS PANEL, ACUTE
HEP A IGM: NEGATIVE
HEP B S AG: NEGATIVE
Hep B C IgM: NEGATIVE
Hep C Virus Ab: <0.1 s/co ratio (ref 0.0–0.9)

## 2014-10-16 LAB — IRON AND TIBC
Iron Saturation: 18 % (ref 15–55)
Iron: 70 ug/dL (ref 27–159)
Total Iron Binding Capacity: 391 ug/dL (ref 250–450)
UIBC: 321 ug/dL (ref 131–425)

## 2014-10-16 LAB — FOLATE: FOLATE: 8 ng/mL (ref >3.0)

## 2014-10-16 LAB — TSH: TSH: 4.13 u[IU]/mL (ref 0.450–4.500)

## 2014-10-16 LAB — LIPID PANEL W/O CHOL/HDL RATIO
Cholesterol, Total: 190 mg/dL (ref 100–199)
HDL: 90 mg/dL (ref >39)
LDL CALC: 75 mg/dL (ref 0–99)
TRIGLYCERIDES: 124 mg/dL (ref 0–149)
VLDL Cholesterol Cal: 25 mg/dL (ref 5–40)

## 2014-10-16 LAB — RPR: RPR Ser Ql: NONREACTIVE

## 2014-10-16 LAB — HIV ANTIBODY (ROUTINE TESTING W REFLEX): HIV Screen 4th Generation wRfx: NONREACTIVE

## 2014-10-16 LAB — FERRITIN: FERRITIN: 31 ng/mL (ref 15–150)

## 2014-10-19 LAB — GC/CHLAMYDIA PROBE AMP: NEISSERIA GONORRHOEAE BY PCR: POSITIVE — AB

## 2014-10-20 ENCOUNTER — Other Ambulatory Visit: Payer: Self-pay | Admitting: Family Medicine

## 2014-10-20 ENCOUNTER — Ambulatory Visit: Payer: Medicaid Other

## 2014-10-20 ENCOUNTER — Telehealth: Payer: Self-pay | Admitting: Family Medicine

## 2014-10-20 DIAGNOSIS — A549 Gonococcal infection, unspecified: Secondary | ICD-10-CM

## 2014-10-20 DIAGNOSIS — Z7251 High risk heterosexual behavior: Secondary | ICD-10-CM | POA: Insufficient documentation

## 2014-10-20 MED ORDER — AZITHROMYCIN 250 MG PO TABS: ORAL_TABLET | ORAL | Status: DC

## 2014-10-20 MED ORDER — CEFTRIAXONE SODIUM 1 G IJ SOLR: 250.0000 mg | Freq: Once | INTRAMUSCULAR | Status: DC

## 2014-10-20 NOTE — Telephone Encounter (Signed)
Called patient with the results of her urine test- positive gonorrhea. Will come in for ceftriaxone shot, azithromycin sent to her pharmacy. Safe sex until both she and her partner are tested and treated. Form for health department filled out to be faxed after she comes in for her shot.

## 2014-10-23 ENCOUNTER — Other Ambulatory Visit: Payer: Self-pay | Admitting: Family Medicine

## 2014-10-24 ENCOUNTER — Telehealth: Payer: Self-pay | Admitting: Family Medicine

## 2014-10-24 NOTE — Telephone Encounter (Signed)
Marylene Landngela from [facility] called and needs updated info/treatment plan about pt's STD.

## 2014-10-27 NOTE — Telephone Encounter (Signed)
Form filled out again and faxed over.

## 2014-10-27 NOTE — Telephone Encounter (Signed)
Forward to provider to fill out another form.

## 2014-10-28 ENCOUNTER — Encounter: Payer: Self-pay | Admitting: Family Medicine

## 2014-10-28 ENCOUNTER — Ambulatory Visit (INDEPENDENT_AMBULATORY_CARE_PROVIDER_SITE_OTHER): Payer: Medicaid Other | Admitting: Family Medicine

## 2014-10-28 VITALS — BP 128/67 | HR 72 | Temp 98.4°F | Ht 63.5 in | Wt 147.0 lb

## 2014-10-28 DIAGNOSIS — Z0000 Encounter for general adult medical examination without abnormal findings: Secondary | ICD-10-CM | POA: Diagnosis not present

## 2014-10-28 DIAGNOSIS — A549 Gonococcal infection, unspecified: Secondary | ICD-10-CM

## 2014-10-28 DIAGNOSIS — Z124 Encounter for screening for malignant neoplasm of cervix: Secondary | ICD-10-CM | POA: Diagnosis not present

## 2014-10-28 NOTE — Progress Notes (Signed)
BP 128/67 mmHg  Pulse 72  Temp(Src) 98.4 F (36.9 C)  Ht 5' 3.5" (1.613 m)  Wt 147 lb (66.679 kg)  BMI 25.63 kg/m2  SpO2 99%  LMP 10/26/2014   Subjective:    Patient ID: Paula Mullins, female    DOB: 11/04/1970, 44 y.o.   MRN: [id number]  HPI: Paula RegesSheila A Ticas is a 44 y.o. female presenting on 10/28/2014 for comprehensive medical examination. Current medical complaints include: vaginal odor  She currently lives with: children and significant other Menopausal Symptoms: yes  Depression Screen done today and results listed below:  [facility]  Decreased Interest 0  Down, Depressed, Hopeless 0  PHQ - 2 Score 0    The patient does not have a history of falls. I did not complete a risk assessment for falls. A plan of care for falls was not documented.  Past Medical History:  Past Medical History  Diagnosis Date  . Sleep apnea   . Anxiety     Pt reports anxiety    Surgical History:  Past Surgical History  Procedure Laterality Date  . Cesarean section    . No past surgeries      C-section  . Hernia repair      Medications:  Current Outpatient Prescriptions on File Prior to Visit  Medication Sig  . amitriptyline (ELAVIL) 25 MG tablet Take 1 tablet (25 mg total) by mouth at bedtime.  . naproxen (NAPROSYN) 250 MG tablet Take 250 mg by mouth 2 (two) times daily with a meal.   No current facility-administered medications on file prior to visit.    Allergies:  Allergies  Allergen Reactions  . Flagyl [Metronidazole] Swelling    Social History:  Social History   Social History  . Marital Status: Single    Spouse Name: N/A  . Number of Children: N/A  . Years of Education: N/A   Occupational History  . Not on file.   Social History Main Topics  . Smoking status: Never Smoker   . Smokeless tobacco: Never Used  . Alcohol Use: Yes     Comment: 2 shots once a month  . Drug Use: No     Comment: crack  . Sexual Activity: No   Other  Topics Concern  . Not on file   Social History Narrative   History  Smoking status  . Never Smoker   Smokeless tobacco  . Never Used   History  Alcohol Use  . Yes    Comment: 2 shots once a month    Family History:  Family History  Problem Relation Age of Onset  . Cancer Sister     Ovarian  . Cancer Maternal Grandmother     Leukemia    Past medical history, surgical history, medications, allergies, family history and social history reviewed with patient today and changes made to appropriate areas of the chart.   Review of Systems  Constitutional: Negative.   HENT: Negative.   Eyes: Positive for blurred vision. Negative for double vision, photophobia, pain, discharge and redness.  Respiratory: Negative.   Cardiovascular: Negative.   Gastrointestinal: Negative.   Genitourinary: Negative.        Vaginal odor  Musculoskeletal: Negative.   Skin: Negative.   Neurological: Negative.   Endo/Heme/Allergies: Negative for environmental allergies and polydipsia. Bruises/bleeds easily.  Psychiatric/Behavioral: Negative for depression, suicidal ideas, hallucinations, memory loss and substance abuse. The patient is nervous/anxious. The patient does not have insomnia.    All  other ROS negative except what is listed above and in the HPI.      Objective:    BP 128/67 mmHg  Pulse 72  Temp(Src) 98.4 F (36.9 C)  Ht 5' 3.5" (1.613 m)  Wt 147 lb (66.679 kg)  BMI 25.63 kg/m2  SpO2 99%  LMP 10/26/2014  Wt Readings from Last 3 Encounters:  10/28/14 147 lb (66.679 kg)  10/15/14 145 lb (65.772 kg)  09/07/12 155 lb (70.308 kg)    Physical Exam  Constitutional: She is oriented to person, place, and time. She appears well-developed and well-nourished. No distress.  HENT:  Head: Normocephalic and atraumatic.  Right Ear: Hearing and external ear normal.  Left Ear: Hearing and external ear normal.  Nose: Nose normal.  Mouth/Throat: Oropharynx is clear and moist. No oropharyngeal  exudate.  Eyes: Conjunctivae and lids are normal. Pupils are equal, round, and reactive to light. Right eye exhibits no discharge. Left eye exhibits no discharge. No scleral icterus.  Neck: Normal range of motion. Neck supple. No JVD present. No tracheal deviation present. No thyromegaly present.  Cardiovascular: Normal rate, regular rhythm, normal heart sounds and intact distal pulses.  Exam reveals no gallop and no friction rub.   No murmur heard. Pulmonary/Chest: Effort normal and breath sounds normal. No stridor. No respiratory distress. She has no wheezes. She has no rales. She exhibits no tenderness.  Abdominal: Soft. Bowel sounds are normal. She exhibits no distension and no mass. There is no tenderness. There is no rebound and no guarding. Hernia confirmed negative in the right inguinal area and confirmed negative in the left inguinal area.  Genitourinary: Uterus normal. No breast swelling, tenderness, discharge or bleeding. No labial fusion. There is no rash, tenderness, lesion or injury on the right labia. There is no rash, tenderness, lesion or injury on the left labia. Uterus is not deviated, not enlarged, not fixed and not tender. Cervix exhibits no motion tenderness, no discharge and no friability. Right adnexum displays no mass, no tenderness and no fullness. Left adnexum displays no mass, no tenderness and no fullness. There is bleeding in the vagina. No erythema or tenderness in the vagina. No foreign body around the vagina. No signs of injury around the vagina. No vaginal discharge found.  Patient on menses, blood in the vaginal vault  Musculoskeletal: Normal range of motion. She exhibits no edema or tenderness.  Lymphadenopathy:    She has no cervical adenopathy.       Right: No inguinal adenopathy present.       Left: No inguinal adenopathy present.  Neurological: She is alert and oriented to person, place, and time. She has normal reflexes. She displays normal reflexes. No cranial  nerve deficit. She exhibits normal muscle tone. Coordination normal.  Skin: Skin is warm, dry and intact. No rash noted. She is not diaphoretic. No erythema. No pallor.  Psychiatric: She has a normal mood and affect. Her speech is normal and behavior is normal. Judgment and thought content normal. Cognition and memory are normal.  Nursing note and vitals reviewed.   Results for orders placed or performed in visit on 10/15/14  GC/Chlamydia Probe Amp  Result Value Ref Range   Neisseria gonorrhoeae by PCR Positive (A) Negative  Bayer DCA Hb A1c Waived  Result Value Ref Range   Bayer DCA Hb A1c Waived 6.2 <7.0 %  CBC With Differential/Platelet  Result Value Ref Range   WBC 7.9 3.4 - 10.8 x10E3/uL   RBC 4.00 3.77 - 5.28 x10E6/uL  Hemoglobin 12.4 11.1 - 15.9 g/dL   Hematocrit 40.9 81.1 - 46.6 %   MCV 90 79 - 97 fL   MCH 31.0 26.6 - 33.0 pg   MCHC 34.4 31.5 - 35.7 g/dL   RDW 91.4 78.2 - 95.6 %   Platelets 240 150 - 379 x10E3/uL   Neutrophils 73 %   Lymphs 21 %   MID 6 %   Neutrophils Absolute 5.8 1.4 - 7.0 x10E3/uL   Lymphocytes Absolute 1.6 0.7 - 3.1 x10E3/uL   MID (Absolute) 0.5 0.1 - 1.6 X10E3/uL  Comprehensive metabolic panel  Result Value Ref Range   Glucose 101 (H) 65 - 99 mg/dL   BUN 18 6 - 24 mg/dL   Creatinine, Ser 2.13 (H) 0.57 - 1.00 mg/dL   GFR calc non Af Amer 67 >59 mL/min/1.73   GFR calc Af Amer 77 >59 mL/min/1.73   BUN/Creatinine Ratio 18 9 - 23   Sodium 139 134 - 144 mmol/L   Potassium 3.9 3.5 - 5.2 mmol/L   Chloride 101 97 - 108 mmol/L   CO2 23 18 - 29 mmol/L   Calcium 9.5 8.7 - 10.2 mg/dL   Total Protein 6.9 6.0 - 8.5 g/dL   Albumin 4.1 3.5 - 5.5 g/dL   Globulin, Total 2.8 1.5 - 4.5 g/dL   Albumin/Globulin Ratio 1.5 1.1 - 2.5   Bilirubin Total 0.9 0.0 - 1.2 mg/dL   Alkaline Phosphatase 54 39 - 117 IU/L   AST 11 0 - 40 IU/L   ALT 13 0 - 32 IU/L  HIV antibody  Result Value Ref Range   HIV Screen 4th Generation wRfx Non Reactive Non Reactive  Iron and  TIBC  Result Value Ref Range   Total Iron Binding Capacity 391 250 - 450 ug/dL   UIBC 086 578 - 469 ug/dL   Iron 70 27 - 629 ug/dL   Iron Saturation 18 15 - 55 %  Lipid Panel w/o Chol/HDL Ratio  Result Value Ref Range   Cholesterol, Total 190 100 - 199 mg/dL   Triglycerides 528 0 - 149 mg/dL   HDL 90 >41 mg/dL   VLDL Cholesterol Cal 25 5 - 40 mg/dL   LDL Calculated 75 0 - 99 mg/dL  TSH  Result Value Ref Range   TSH 4.130 0.450 - 4.500 uIU/mL  UA/M w/rflx Culture, Routine  Result Value Ref Range   Specific Gravity, UA 1.005 1.005 - 1.030   pH, UA 5.0 5.0 - 7.5   Color, UA Yellow Yellow   Appearance Ur Clear Clear   Leukocytes, UA Negative Negative   Protein, UA Negative Negative/Trace   Glucose, UA Negative Negative   Ketones, UA Negative Negative   RBC, UA Negative Negative   Bilirubin, UA Negative Negative   Urobilinogen, Ur 0.2 0.2 - 1.0 mg/dL   Nitrite, UA Negative Negative  RPR  Result Value Ref Range   RPR Ser Ql Non Reactive Non Reactive  Hepatitis, Acute  Result Value Ref Range   Hep A IgM Negative Negative   Hepatitis B Surface Ag Negative Negative   Hep B C IgM Negative Negative   Hep C Virus Ab <0.1 0.0 - 0.9 s/co ratio  HSV(herpes simplex vrs) 1+2 ab-IgG  Result Value Ref Range   HSV 1 Glycoprotein G Ab, IgG 27.60 (H) 0.00 - 0.90 index   HSV 2 Glycoprotein G Ab, IgG 4.29 (H) 0.00 - 0.90 index  B12  Result Value Ref Range   Vitamin B-12 340  211 - 946 pg/mL  Folate  Result Value Ref Range   Folate 8.0 >3.0 ng/mL  Ferritin  Result Value Ref Range   Ferritin 31 15 - 150 ng/mL      Assessment & Plan:   Problem List Items Addressed This Visit      Other   Gonorrhea    Treated with ceftriaxone, couldn't afford azithromycin, [facility].        Other Visit Diagnoses    Routine general medical examination at a health care facility    -  Primary    Pap done today, Mammo ordered. Screening labs normal.  Vaccines up to date. Work on diet and exercise. Continue to monitor.     Screening for cervical cancer        Pap done today.     Relevant Orders    IGP, Aptima HPV, rfx 16/18,45        Follow up plan: Return 4-8 weeks, for follow up migraines.   LABORATORY TESTING:  - Pap smear: pap done  IMMUNIZATIONS:   - Tdap: Tetanus vaccination status reviewed: last tetanus booster within 10 years. - Influenza: Up to date  SCREENING: -Mammogram: Ordered today  - Colonoscopy: Not applicable   PATIENT COUNSELING:   Advised to take 1 mg of folate supplement per day if capable of pregnancy.   Sexuality: Discussed sexually transmitted diseases, partner selection, use of condoms, avoidance of unintended pregnancy  and contraceptive alternatives.   Advised to avoid cigarette smoking.  I discussed with the patient that most people either abstain from alcohol or drink within safe limits (<=14/week and <=4 drinks/occasion for males, <=7/weeks and <= 3 drinks/occasion for females) and that the risk for alcohol disorders and other health effects rises proportionally with the number of drinks per week and how often a drinker exceeds daily limits.  Discussed cessation/primary prevention of drug use and availability of treatment for abuse.   Diet: Encouraged to adjust caloric intake to maintain  or achieve ideal body weight, to reduce intake of dietary saturated fat and total fat, to limit sodium intake by avoiding high sodium foods and not adding table salt, and to maintain adequate dietary potassium and calcium preferably from fresh fruits, vegetables, and low-fat dairy products.    stressed the importance of regular exercise  Injury prevention: Discussed safety belts, safety helmets, smoke detector, smoking near bedding or upholstery.   Dental health: Discussed importance of regular tooth brushing, flossing, and dental visits.    NEXT PREVENTATIVE PHYSICAL DUE IN 1 YEAR. Return 4-8 weeks, for  follow up migraines.

## 2014-10-28 NOTE — Assessment & Plan Note (Signed)
Treated with ceftriaxone, couldn't afford azithromycin, [facility].

## 2014-10-28 NOTE — Patient Instructions (Signed)
Health Maintenance, Female Adopting a healthy lifestyle and getting preventive care can go a long way to promote health and wellness. Talk with your health care provider about what schedule of regular examinations is right for you. This is a good chance for you to check in with your provider about disease prevention and staying healthy. In between checkups, there are plenty of things you can do on your own. Experts have done a lot of research about which lifestyle changes and preventive measures are most likely to keep you healthy. Ask your health care provider for more information. WEIGHT AND DIET  Eat a healthy diet  Be sure to include plenty of vegetables, fruits, low-fat dairy products, and lean protein.  Do not eat a lot of foods high in solid fats, added sugars, or salt.  Get regular exercise. This is one of the most important things you can do for your health.  Most adults should exercise for at least 150 minutes each week. The exercise should increase your heart rate and make you sweat (moderate-intensity exercise).  Most adults should also do strengthening exercises at least twice a week. This is in addition to the moderate-intensity exercise.  Maintain a healthy weight  Body mass index (BMI) is a measurement that can be used to identify possible weight problems. It estimates body fat based on height and weight. Your health care provider can help determine your BMI and help you achieve or maintain a healthy weight.  For females 20 years of age and older:   A BMI below 18.5 is considered underweight.  A BMI of 18.5 to 24.9 is normal.  A BMI of 25 to 29.9 is considered overweight.  A BMI of 30 and above is considered obese.  Watch levels of cholesterol and blood lipids  You should start having your blood tested for lipids and cholesterol at 46 years of age, then have this test every 5 years.  You may need to have your cholesterol levels checked more often if:  Your lipid  or cholesterol levels are high.  You are older than 46 years of age.  You are at high risk for heart disease.  CANCER SCREENING   Lung Cancer  Lung cancer screening is recommended for adults 55-80 years old who are at high risk for lung cancer because of a history of smoking.  A yearly low-dose CT scan of the lungs is recommended for people who:  Currently smoke.  Have quit within the past 15 years.  Have at least a 30-pack-year history of smoking. A pack year is smoking an average of one pack of cigarettes a day for 1 year.  Yearly screening should continue until it has been 15 years since you quit.  Yearly screening should stop if you develop a health problem that would prevent you from having lung cancer treatment.  Breast Cancer  Practice breast self-awareness. This means understanding how your breasts normally appear and feel.  It also means doing regular breast self-exams. Let your health care provider know about any changes, no matter how small.  If you are in your 20s or 30s, you should have a clinical breast exam (CBE) by a health care provider every 1-3 years as part of a regular health exam.  If you are 40 or older, have a CBE every year. Also consider having a breast X-ray (mammogram) every year.  If you have a family history of breast cancer, talk to your health care provider about genetic screening.  If you   are at high risk for breast cancer, talk to your health care provider about having an MRI and a mammogram every year.  Breast cancer gene (BRCA) assessment is recommended for women who have family members with BRCA-related cancers. BRCA-related cancers include:  Breast.  Ovarian.  Tubal.  Peritoneal cancers.  Results of the assessment will determine the need for genetic counseling and BRCA1 and BRCA2 testing. Cervical Cancer Your health care provider may recommend that you be screened regularly for cancer of the pelvic organs (ovaries, uterus, and  vagina). This screening involves a pelvic examination, including checking for microscopic changes to the surface of your cervix (Pap test). You may be encouraged to have this screening done every 3 years, beginning at age 21.  For women ages 30-65, health care providers may recommend pelvic exams and Pap testing every 3 years, or they may recommend the Pap and pelvic exam, combined with testing for human papilloma virus (HPV), every 5 years. Some types of HPV increase your risk of cervical cancer. Testing for HPV may also be done on women of any age with unclear Pap test results.  Other health care providers may not recommend any screening for nonpregnant women who are considered low risk for pelvic cancer and who do not have symptoms. Ask your health care provider if a screening pelvic exam is right for you.  If you have had past treatment for cervical cancer or a condition that could lead to cancer, you need Pap tests and screening for cancer for at least 20 years after your treatment. If Pap tests have been discontinued, your risk factors (such as having a new sexual partner) need to be reassessed to determine if screening should resume. Some women have medical problems that increase the chance of getting cervical cancer. In these cases, your health care provider may recommend more frequent screening and Pap tests. Colorectal Cancer  This type of cancer can be detected and often prevented.  Routine colorectal cancer screening usually begins at 46 years of age and continues through 46 years of age.  Your health care provider may recommend screening at an earlier age if you have risk factors for colon cancer.  Your health care provider may also recommend using home test kits to check for hidden blood in the stool.  A small camera at the end of a tube can be used to examine your colon directly (sigmoidoscopy or colonoscopy). This is done to check for the earliest forms of colorectal  cancer.  Routine screening usually begins at age 50.  Direct examination of the colon should be repeated every 5-10 years through 46 years of age. However, you may need to be screened more often if early forms of precancerous polyps or small growths are found. Skin Cancer  Check your skin from head to toe regularly.  Tell your health care provider about any new moles or changes in moles, especially if there is a change in a mole's shape or color.  Also tell your health care provider if you have a mole that is larger than the size of a pencil eraser.  Always use sunscreen. Apply sunscreen liberally and repeatedly throughout the day.  Protect yourself by wearing long sleeves, pants, a wide-brimmed hat, and sunglasses whenever you are outside. HEART DISEASE, DIABETES, AND HIGH BLOOD PRESSURE   High blood pressure causes heart disease and increases the risk of stroke. High blood pressure is more likely to develop in:  People who have blood pressure in the high end   of the normal range (130-139/85-89 mm Hg).  People who are overweight or obese.  People who are African American.  If you are 38-23 years of age, have your blood pressure checked every 3-5 years. If you are 61 years of age or older, have your blood pressure checked every year. You should have your blood pressure measured twice--once when you are at a hospital or clinic, and once when you are not at a hospital or clinic. Record the average of the two measurements. To check your blood pressure when you are not at a hospital or clinic, you can use:  An automated blood pressure machine at a pharmacy.  A home blood pressure monitor.  If you are between 45 years and 39 years old, ask your health care provider if you should take aspirin to prevent strokes.  Have regular diabetes screenings. This involves taking a blood sample to check your fasting blood sugar level.  If you are at a normal weight and have a low risk for diabetes,  have this test once every three years after 46 years of age.  If you are overweight and have a high risk for diabetes, consider being tested at a younger age or more often. PREVENTING INFECTION  Hepatitis B  If you have a higher risk for hepatitis B, you should be screened for this virus. You are considered at high risk for hepatitis B if:  You were born in a country where hepatitis B is common. Ask your health care provider which countries are considered high risk.  Your parents were born in a high-risk country, and you have not been immunized against hepatitis B (hepatitis B vaccine).  You have HIV or AIDS.  You use needles to inject street drugs.  You live with someone who has hepatitis B.  You have had sex with someone who has hepatitis B.  You get hemodialysis treatment.  You take certain medicines for conditions, including cancer, organ transplantation, and autoimmune conditions. Hepatitis C  Blood testing is recommended for:  Everyone born from [birth year] through 1967.  Anyone with known risk factors for hepatitis C. Sexually transmitted infections (STIs)  You should be screened for sexually transmitted infections (STIs) including gonorrhea and chlamydia if:  You are sexually active and are younger than 46 years of age.  You are older than 46 years of age and your health care provider tells you that you are at risk for this type of infection.  Your sexual activity has changed since you were last screened and you are at an increased risk for chlamydia or gonorrhea. Ask your health care provider if you are at risk.  If you do not have HIV, but are at risk, it may be recommended that you take a prescription medicine daily to prevent HIV infection. This is called pre-exposure prophylaxis (PrEP). You are considered at risk if:  You are sexually active and do not regularly use condoms or know the HIV status of your partner(s).  You take drugs by injection.  You are sexually  active with a partner who has HIV. Talk with your health care provider about whether you are at high risk of being infected with HIV. If you choose to begin PrEP, you should first be tested for HIV. You should then be tested every 3 months for as long as you are taking PrEP.  PREGNANCY   If you are premenopausal and you may become pregnant, ask your health care provider about preconception counseling.  If you may  become pregnant, take 400 to 800 micrograms (mcg) of folic acid every day.  If you want to prevent pregnancy, talk to your health care provider about birth control (contraception). OSTEOPOROSIS AND MENOPAUSE   Osteoporosis is a disease in which the bones lose minerals and strength with aging. This can result in serious bone fractures. Your risk for osteoporosis can be identified using a bone density scan.  If you are 61 years of age or older, or if you are at risk for osteoporosis and fractures, ask your health care provider if you should be screened.  Ask your health care provider whether you should take a calcium or vitamin D supplement to lower your risk for osteoporosis.  Menopause may have certain physical symptoms and risks.  Hormone replacement therapy may reduce some of these symptoms and risks. Talk to your health care provider about whether hormone replacement therapy is right for you.  HOME CARE INSTRUCTIONS   Schedule regular health, dental, and eye exams.  Stay current with your immunizations.   Do not use any tobacco products including cigarettes, chewing tobacco, or electronic cigarettes.  If you are pregnant, do not drink alcohol.  If you are breastfeeding, limit how much and how often you drink alcohol.  Limit alcohol intake to no more than 1 drink per day for nonpregnant women. One drink equals 12 ounces of beer, 5 ounces of wine, or 1 ounces of hard liquor.  Do not use street drugs.  Do not share needles.  Ask your health care provider for help if  you need support or information about quitting drugs.  Tell your health care provider if you often feel depressed.  Tell your health care provider if you have ever been abused or do not feel safe at home.   This information is not intended to replace advice given to you by your health care provider. Make sure you discuss any questions you have with your health care provider.   Document Released: 07/05/2010 Document Revised: 01/10/2014 Document Reviewed: 11/21/2012 Elsevier Interactive Patient Education [employer].

## 2014-10-30 ENCOUNTER — Telehealth: Payer: Self-pay | Admitting: Family Medicine

## 2014-10-30 LAB — IGP, APTIMA HPV, RFX 16/18,45: PAP Smear Comment: 0

## 2014-10-30 NOTE — Telephone Encounter (Signed)
Results given over the phone. No need for pap for 5 year. Negative Pap and negative HPV

## 2015-01-19 ENCOUNTER — Ambulatory Visit (INDEPENDENT_AMBULATORY_CARE_PROVIDER_SITE_OTHER): Payer: Medicaid Other | Admitting: Family Medicine

## 2015-01-19 ENCOUNTER — Encounter: Payer: Self-pay | Admitting: Family Medicine

## 2015-01-19 VITALS — BP 157/106 | HR 70 | Temp 98.0°F | Ht 63.3 in | Wt 154.0 lb

## 2015-01-19 DIAGNOSIS — K047 Periapical abscess without sinus: Secondary | ICD-10-CM | POA: Diagnosis not present

## 2015-01-19 MED ORDER — AMOXICILLIN-POT CLAVULANATE 875-125 MG PO TABS: 1.0000 | ORAL_TABLET | Freq: Two times a day (BID) | ORAL | Status: DC

## 2015-01-19 NOTE — Patient Instructions (Signed)
Dental Abscess A dental abscess is pus in or around a tooth. HOME CARE  Take medicines only as told by your dentist.  If you were prescribed antibiotic medicine, finish all of it even if you start to feel better.  Rinse your mouth (gargle) often with salt water.  Do not drive or use heavy machinery, like a lawn mower, while taking pain medicine.  Do not apply heat to the outside of your mouth.  Keep all follow-up visits as told by your dentist. This is important. GET HELP IF:  Your pain is worse, and medicine does not help. GET HELP RIGHT AWAY IF:  You have a fever or chills.  Your symptoms suddenly get worse.  You have a very bad headache.  You have problems breathing or swallowing.  You have trouble opening your mouth.  You have puffiness (swelling) in your neck or around your eye.   This information is not intended to replace advice given to you by your health care provider. Make sure you discuss any questions you have with your health care provider.   Document Released: 05/06/2014 Document Reviewed: 05/06/2014 Elsevier Interactive Patient Education 2016 [employer]  

## 2015-01-19 NOTE — Progress Notes (Signed)
BP 157/106 mmHg  Pulse 70  Temp(Src) 98 F (36.7 C)  Ht 5' 3.3" (1.608 m)  Wt 154 lb (69.854 kg)  BMI 27.02 kg/m2  SpO2 98%  LMP  (LMP Unknown)   Subjective:    Patient ID: Paula Mullins, female    DOB: 11/09/1970, 45 y.o.   MRN: [id number]  HPI: Paula Mullins is a 45 y.o. female  Chief Complaint  Patient presents with  . Jaw Pain    Patient states that she has a knot behind her moler and she is having pain. She is unsure if this is the beginning of a abscess.   DENTAL PAIN Duration: Yesterday Involved teeth: right and lower Dentist evaluation: no Mechanism of injury:  unknown Onset: sudden Severity: 8/10 Quality: sharp Frequency: constant Radiation: into her jaw Aggravating factors: chewing Alleviating factors: nothing Status: worse Treatments attempted: NSAIDs Relief with NSAIDs?: no Fevers: no Swelling: yes Redness: yes Paresthesias / decreased sensation: no Sinus pressure: no  Relevant past medical, surgical, family and social history reviewed and updated as indicated. Interim medical history since our last visit reviewed. Allergies and medications reviewed and updated.  Review of Systems  Constitutional: Negative.   HENT: Positive for dental problem. Negative for congestion, drooling, ear discharge, ear pain, facial swelling, hearing loss, mouth sores, nosebleeds, postnasal drip, rhinorrhea, sinus pressure, sneezing, sore throat, tinnitus, trouble swallowing and voice change.   Respiratory: Negative.   Cardiovascular: Negative.   Psychiatric/Behavioral: Negative.     Per HPI unless specifically indicated above     Objective:    BP 157/106 mmHg  Pulse 70  Temp(Src) 98 F (36.7 C)  Ht 5' 3.3" (1.608 m)  Wt 154 lb (69.854 kg)  BMI 27.02 kg/m2  SpO2 98%  LMP  (LMP Unknown)  Wt Readings from Last 3 Encounters:  01/19/15 154 lb (69.854 kg)  10/28/14 147 lb (66.679 kg)  10/15/14 145 lb (65.772 kg)    Physical Exam  Constitutional: She is  oriented to person, place, and time. She appears well-developed and well-nourished. No distress.  HENT:  Head: Normocephalic and atraumatic.  Right Ear: Hearing, tympanic membrane, external ear and ear canal normal.  Left Ear: Hearing, tympanic membrane, external ear and ear canal normal.  Nose: Nose normal.  Mouth/Throat: Oropharynx is clear and moist. Abnormal dentition. Dental abscesses and dental caries present. No oropharyngeal exudate.    Eyes: Conjunctivae, EOM and lids are normal. Pupils are equal, round, and reactive to light. Right eye exhibits no discharge. Left eye exhibits no discharge. No scleral icterus.  Pulmonary/Chest: Effort normal. No respiratory distress.  Musculoskeletal: Normal range of motion.  Neurological: She is alert and oriented to person, place, and time.  Skin: Skin is intact. No rash noted.  Psychiatric: She has a normal mood and affect. Her speech is normal and behavior is normal. Judgment and thought content normal. Cognition and memory are normal.  Nursing note and vitals reviewed.   Results for orders placed or performed in visit on 10/28/14  IGP, Aptima HPV, rfx 16/18,45  Result Value Ref Range   DIAGNOSIS: Comment    Specimen adequacy: Comment    CLINICIAN PROVIDED ICD10: Comment    Performed by: Comment    PAP SMEAR COMMENT .    Note: Comment    Test Methodology Comment       Assessment & Plan:   Problem List Items Addressed This Visit    None    Visit Diagnoses    Dental abscess    -  Primary    Referral to dentistry made. Information for patient to call given. Augmentin x20 days. She will let us know if not getting better or getting worse.     Relevant Medications    amoxicillin-clavulanate (AUGMENTIN) 875-125 MG tablet    Other Relevant Orders    Ambulatory referral to Dentistry        Follow up plan: Return if symptoms worsen or fail to improve.

## 2015-01-21 ENCOUNTER — Telehealth: Payer: Self-pay

## 2015-01-21 DIAGNOSIS — K047 Periapical abscess without sinus: Secondary | ICD-10-CM

## 2015-01-21 NOTE — Telephone Encounter (Signed)
Dentist is [facility] [address] [place] [address] [phone]

## 2015-01-21 NOTE — Telephone Encounter (Signed)
Referral generated

## 2015-01-21 NOTE — Telephone Encounter (Signed)
Patient called, she states that she in a lot of pain. She is currently taking the antibiotic and motrin. She would like to know if there is anything else that you can give her.  Patient also needs a referral to a different dentist, she is going to get [place] the name of the other dentist she would like to go to.

## 2015-02-19 ENCOUNTER — Telehealth: Payer: Self-pay | Admitting: Family Medicine

## 2015-02-19 ENCOUNTER — Ambulatory Visit
Admission: EM | Admit: 2015-02-19 | Discharge: 2015-02-19 | Disposition: A | Discharge status: Home / Self Care | Payer: Medicaid Other

## 2015-02-19 MED ORDER — NAPROXEN 250 MG PO TABS: 250.0000 mg | ORAL_TABLET | Freq: Two times a day (BID) | ORAL | Status: DC

## 2015-02-19 NOTE — Telephone Encounter (Signed)
Called and left a message for patient to return my call.  

## 2015-02-19 NOTE — Telephone Encounter (Signed)
RX Refill [facility] [address] Naproxen

## 2015-02-19 NOTE — Telephone Encounter (Signed)
Rx sent to her pharmacy 

## 2015-02-19 NOTE — Telephone Encounter (Signed)
Patient called stated she would like a call back from Tiffany ASAP. Thanks.

## 2015-02-26 ENCOUNTER — Encounter: Payer: Self-pay | Admitting: Family Medicine

## 2015-02-26 ENCOUNTER — Ambulatory Visit (INDEPENDENT_AMBULATORY_CARE_PROVIDER_SITE_OTHER): Payer: Medicaid Other | Admitting: Family Medicine

## 2015-02-26 VITALS — BP 155/96 | HR 82 | Temp 99.0°F | Ht 63.3 in | Wt 153.0 lb

## 2015-02-26 DIAGNOSIS — J0100 Acute maxillary sinusitis, unspecified: Secondary | ICD-10-CM | POA: Diagnosis not present

## 2015-02-26 DIAGNOSIS — Z79899 Other long term (current) drug therapy: Secondary | ICD-10-CM

## 2015-02-26 MED ORDER — AMOXICILLIN-POT CLAVULANATE 875-125 MG PO TABS: 1.0000 | ORAL_TABLET | Freq: Two times a day (BID) | ORAL | Status: DC

## 2015-02-26 NOTE — Progress Notes (Signed)
BP 155/96 mmHg  Pulse 82  Temp(Src) 99 F (37.2 C)  Ht 5' 3.3" (1.608 m)  Wt 153 lb (69.4 kg)  BMI 26.84 kg/m2  SpO2 96%   Subjective:    Patient ID: Paula Mullins, female    DOB: Feb 25, 1970, 45 y.o.   MRN: [id number]  HPI: Paula Mullins is a 45 y.o. female  Chief Complaint  Patient presents with  . URI    Patient wants to make sure that she is having allergies and not something else  . blood work    [facility] would like for [place] to check a CMP and a CBC with a diff and send them the results.   UPPER RESPIRATORY TRACT INFECTION Duration: 2 weeks Worst symptom: nasal drainage, headache Fever: no Cough: no Shortness of breath: yes Wheezing: no Chest pain: no Chest tightness: yes Chest congestion: no Nasal congestion: yes Runny nose: yes Post nasal drip: yes Sneezing: yes Sore throat: no Swollen glands: yes Sinus pressure: yes Headache: yes Face pain: yes Toothache: yes Ear pain: yes left Ear pressure: no  Eyes red/itching:no Eye drainage/crusting: no  Vomiting: no Rash: no Fatigue: yes Sick contacts: yes Strep contacts: no  Context: stable Recurrent sinusitis: no Relief with OTC cold/cough medications: no  Treatments attempted: anti-histamine    Relevant past medical, surgical, family and social history reviewed and updated as indicated. Interim medical history since our last visit reviewed. Allergies and medications reviewed and updated.  Review of Systems  Constitutional: Negative.   HENT: Positive for congestion, ear pain, postnasal drip, rhinorrhea, sinus pressure, sneezing and sore throat. Negative for dental problem, drooling, ear discharge, facial swelling, hearing loss, mouth sores, nosebleeds, tinnitus, trouble swallowing and voice change.   Respiratory: Negative.   Cardiovascular: Negative.   Psychiatric/Behavioral: Negative.     Per HPI unless specifically indicated above     Objective:    BP 155/96 mmHg  Pulse 82  Temp(Src) 99 F (37.2  C)  Ht 5' 3.3" (1.608 m)  Wt 153 lb (69.4 kg)  BMI 26.84 kg/m2  SpO2 96%  Wt Readings from Last 3 Encounters:  02/26/15 153 lb (69.4 kg)  01/19/15 154 lb (69.854 kg)  10/28/14 147 lb (66.679 kg)    Physical Exam  Constitutional: She is oriented to person, place, and time. She appears well-developed and well-nourished. No distress.  HENT:  Head: Normocephalic and atraumatic.  Right Ear: Hearing, tympanic membrane, external ear and ear canal normal.  Left Ear: Hearing, tympanic membrane, external ear and ear canal normal.  Nose: Mucosal edema, rhinorrhea and sinus tenderness present. No nose lacerations, nasal deformity, septal deviation or nasal septal hematoma. No epistaxis.  No foreign bodies. Right sinus exhibits maxillary sinus tenderness. Right sinus exhibits no frontal sinus tenderness. Left sinus exhibits no maxillary sinus tenderness and no frontal sinus tenderness.  Mouth/Throat: Uvula is midline, oropharynx is clear and moist and mucous membranes are normal. No oropharyngeal exudate.  Eyes: Conjunctivae, EOM and lids are normal. Pupils are equal, round, and reactive to light. Right eye exhibits no discharge. Left eye exhibits no discharge. No scleral icterus.  Neck: Normal range of motion. Neck supple. No JVD present. No tracheal deviation present. No thyromegaly present.  Cardiovascular: Normal rate, regular rhythm, normal heart sounds and intact distal pulses.  Exam reveals no gallop and no friction rub.   No murmur heard. Pulmonary/Chest: Effort normal and breath sounds normal. No stridor. No respiratory distress. She has no wheezes. She has no rales. She exhibits  no tenderness.  Musculoskeletal: Normal range of motion.  Lymphadenopathy:    She has no cervical adenopathy.  Neurological: She is alert and oriented to person, place, and time.  Skin: Skin is warm, dry and intact. No rash noted. She is not diaphoretic. No erythema. No pallor.  Psychiatric: She has a normal mood  and affect. Her speech is normal and behavior is normal. Judgment and thought content normal. Cognition and memory are normal.  Nursing note and vitals reviewed.   Results for orders placed or performed in visit on 10/28/14  IGP, Aptima HPV, rfx 16/18,45  Result Value Ref Range   DIAGNOSIS: Comment    Specimen adequacy: Comment    CLINICIAN PROVIDED ICD10: Comment    Performed by: Comment    PAP SMEAR COMMENT .    Note: Comment    Test Methodology Comment       Assessment & Plan:   Problem List Items Addressed This Visit    None    Visit Diagnoses    Acute maxillary sinusitis, recurrence not specified    -  Primary    Will treat with augmentin. Rest and fluids. Call if not getting better or getting worse.     Relevant Medications    amoxicillin-clavulanate (AUGMENTIN) 875-125 MG tablet    Long-term use of high-risk medication        Needs labs checked for psychiatry, drawn today.    Relevant Orders    CBC with Differential/Platelet    Comprehensive metabolic panel        Follow up plan: Return if symptoms worsen or fail to improve.

## 2015-02-27 ENCOUNTER — Encounter: Payer: Self-pay | Admitting: Family Medicine

## 2015-02-27 LAB — COMPREHENSIVE METABOLIC PANEL
A/G RATIO: 1.7 (ref 1.1–2.5)
ALT: 15 IU/L (ref 0–32)
AST: 12 IU/L (ref 0–40)
Albumin: 4.1 g/dL (ref 3.5–5.5)
Alkaline Phosphatase: 54 IU/L (ref 39–117)
BUN/Creatinine Ratio: 13 (ref 9–23)
BUN: 13 mg/dL (ref 6–24)
Bilirubin Total: 1.1 mg/dL (ref 0.0–1.2)
CO2: 21 mmol/L (ref 18–29)
CREATININE: 0.97 mg/dL (ref 0.57–1.00)
Calcium: 9.1 mg/dL (ref 8.7–10.2)
Chloride: 103 mmol/L (ref 96–106)
GFR, EST AFRICAN AMERICAN: 80 mL/min/{1.73_m2} (ref >59)
GFR, EST NON AFRICAN AMERICAN: 70 mL/min/{1.73_m2} (ref >59)
Globulin, Total: 2.4 g/dL (ref 1.5–4.5)
Glucose: 147 mg/dL — ABNORMAL HIGH (ref 65–99)
POTASSIUM: 3.7 mmol/L (ref 3.5–5.2)
Sodium: 142 mmol/L (ref 134–144)
TOTAL PROTEIN: 6.5 g/dL (ref 6.0–8.5)

## 2015-02-27 LAB — CBC WITH DIFFERENTIAL/PLATELET
BASOS ABS: 0 10*3/uL (ref 0.0–0.2)
BASOS: 0 %
EOS (ABSOLUTE): 0.1 10*3/uL (ref 0.0–0.4)
Eos: 2 %
Hematocrit: 34.7 % (ref 34.0–46.6)
Hemoglobin: 11.7 g/dL (ref 11.1–15.9)
IMMATURE GRANS (ABS): 0 10*3/uL (ref 0.0–0.1)
Immature Granulocytes: 0 %
LYMPHS ABS: 1.6 10*3/uL (ref 0.7–3.1)
LYMPHS: 23 %
MCH: 29.6 pg (ref 26.6–33.0)
MCHC: 33.7 g/dL (ref 31.5–35.7)
MCV: 88 fL (ref 79–97)
Monocytes Absolute: 0.5 10*3/uL (ref 0.1–0.9)
Monocytes: 8 %
NEUTROS ABS: 4.5 10*3/uL (ref 1.4–7.0)
Neutrophils: 67 %
PLATELETS: 253 10*3/uL (ref 150–379)
RBC: 3.95 x10E6/uL (ref 3.77–5.28)
RDW: 13.2 % (ref 12.3–15.4)
WBC: 6.7 10*3/uL (ref 3.4–10.8)

## 2015-03-17 ENCOUNTER — Ambulatory Visit: Payer: Medicaid Other | Admitting: Family Medicine

## 2015-08-21 ENCOUNTER — Encounter (INDEPENDENT_AMBULATORY_CARE_PROVIDER_SITE_OTHER): Payer: Self-pay

## 2016-01-25 ENCOUNTER — Encounter: Payer: Self-pay | Admitting: Family Medicine

## 2016-06-09 ENCOUNTER — Emergency Department (HOSPITAL_COMMUNITY)
Admission: EM | Admit: 2016-06-09 | Discharge: 2016-06-09 | Disposition: A | Discharge status: Home Health | Payer: Medicaid Other | Attending: Emergency Medicine | Admitting: Emergency Medicine

## 2016-06-09 ENCOUNTER — Encounter (HOSPITAL_COMMUNITY): Payer: Self-pay | Admitting: Emergency Medicine

## 2016-06-09 DIAGNOSIS — R509 Fever, unspecified: Secondary | ICD-10-CM | POA: Diagnosis not present

## 2016-06-09 DIAGNOSIS — J029 Acute pharyngitis, unspecified: Secondary | ICD-10-CM

## 2016-06-09 MED ORDER — IBUPROFEN 600 MG PO TABS: 600.0000 mg | ORAL_TABLET | Freq: Four times a day (QID) | ORAL | 0 refills | Status: DC | PRN

## 2016-06-09 MED ORDER — MAGIC MOUTHWASH W/LIDOCAINE: 5.0000 mL | Freq: Three times a day (TID) | ORAL | 0 refills | Status: DC | PRN

## 2016-06-09 NOTE — ED Triage Notes (Signed)
Patient complaining of fever and sore throat since yesterday. States she took ibuprofen today at 0900.

## 2016-06-09 NOTE — Discharge Instructions (Signed)
[facility] Doctor List ° ° ° °Edward Hawkins MD. Specialty: Pulmonary Disease Contact information: [address]  °[address]  °[place] [address]  °[phone]  ° °Margaret Simpson, MD. Specialty: Family Medicine Contact information: [address]  °[place] [address]  °[phone]  ° °Scott Luking, MD. Specialty: Family Medicine Contact information: [address]  °[address]  °[place] [address]  °[phone]  ° °Tesfaye Fanta, MD Specialty: Internal Medicine Contact information: [address]  °[place] [address]  °[phone]  ° °Zach Hall, MD. Specialty: Internal Medicine Contact information: [address]  °[place] [address]  °[phone]  ° °Angus Mcinnis, MD. Specialty: Family Medicine Contact information: [address]  °[place] [address]  °[phone]  ° °Stephen Knowlton, MD. Specialty: Family Medicine Contact information: [address]  °[address]  °[place] [address]  °[phone]  ° °Roy Fagan, MD. Specialty: Internal Medicine Contact information: [address]  °[address]  °  [address]  °[phone]  ° ° °

## 2016-06-11 NOTE — ED Provider Notes (Signed)
[facility]EM[facility] DEPT Provider Note   CSN: [id number] Arrival date & time: 06/09/16  1326     History   Chief Complaint Chief Complaint  Patient presents with  . Sore Throat    HPI Paula Mullins is a 46 y.o. female.  HPI   Paula Mullins is a 46 y.o. female who presents to the Emergency Department complaining of sore throat for one day.  Associated with subjective fever.  Describes sharp throat pain with swallowing.  Pt taking ibuprofen with some relief.  She denies congestion, ear pain, cough and myalgias.     Past Medical History:  Diagnosis Date  . Anxiety    Pt reports anxiety  . Migraine   . Sleep apnea     Patient Active Problem List   Diagnosis Date Noted  . Gonorrhea 10/20/2014  . IFG (impaired fasting glucose) 10/15/2014  . Anemia 10/15/2014  . Migraine 10/15/2014    Past Surgical History:  Procedure Laterality Date  . CESAREAN SECTION  2011  . HERNIA REPAIR  2012  . NO PAST SURGERIES     C-section    OB History    No data available       Home Medications    Prior to Admission medications   Medication Sig Start Date End Date Taking? Authorizing Provider  amoxicillin-clavulanate (AUGMENTIN) 875-125 MG tablet Take 1 tablet by mouth 2 (two) times daily. 02/26/15   Johnson, Megan P, DO  ibuprofen (ADVIL,MOTRIN) 600 MG tablet Take 1 tablet (600 mg total) by mouth every 6 (six) hours as needed. 06/09/16   Wilkins Elpers, PA-C  magic mouthwash w/lidocaine SOLN Take 5 mLs by mouth 3 (three) times daily as needed for mouth pain. Swish and spit, do not swallow 06/09/16   Liann Spaeth, PA-C  naproxen (NAPROSYN) 250 MG tablet Take 1 tablet (250 mg total) by mouth 2 (two) times daily with a meal. 02/19/15   Dorcas CarrowJohnson, Megan P, DO    Family History Family History  Problem Relation Age of Onset  . Cancer Sister        Ovarian  . Cancer Maternal Grandmother        Leukemia  . Asthma Mother   . Allergies Mother     Social History Social History    Substance Use Topics  . Smoking status: Never Smoker  . Smokeless tobacco: Never Used  . Alcohol use Yes     Comment: occasionally     Allergies   Flagyl [metronidazole]   Review of Systems Review of Systems  Constitutional: Positive for fever. Negative for activity change, appetite change and chills.  HENT: Positive for sore throat. Negative for congestion, ear pain, facial swelling, trouble swallowing and voice change.   Eyes: Negative for pain and visual disturbance.  Respiratory: Negative for cough and shortness of breath.   Gastrointestinal: Negative for abdominal pain, nausea and vomiting.  Musculoskeletal: Negative for arthralgias, neck pain and neck stiffness.  Skin: Negative for color change and rash.  Neurological: Negative for dizziness, facial asymmetry, speech difficulty, numbness and headaches.  Hematological: Negative for adenopathy.  All other systems reviewed and are negative.    Physical Exam Updated Vital Signs BP (!) 162/97 (BP Location: Right Arm)   Pulse 90   Temp 98 F (36.7 C) (Oral)   Resp 19   Ht 5\' 4"  (1.626 m)   Wt 59 kg (130 lb)   LMP 06/06/2016   SpO2 95%   BMI 22.31 kg/m   Physical Exam  Constitutional: She is oriented to person, place, and time. She appears well-developed and well-nourished. No distress.  HENT:  Head: Normocephalic and atraumatic.  Right Ear: Tympanic membrane and ear canal normal.  Left Ear: Tympanic membrane and ear canal normal.  Nose: No mucosal edema or rhinorrhea.  Mouth/Throat: Uvula is midline and mucous membranes are normal. No trismus in the jaw. No uvula swelling. Posterior oropharyngeal erythema present. No oropharyngeal exudate, posterior oropharyngeal edema or tonsillar abscesses.  Erythema of oropharynx,  Uvula is midline, non-edematous.  No PTA or pharyngeal abscess.  Neck: Normal range of motion. Neck supple.  Cardiovascular: Normal rate, regular rhythm and normal heart sounds.   No murmur  heard. Pulmonary/Chest: Effort normal and breath sounds normal.  Abdominal: Soft. She exhibits no distension. There is no splenomegaly. There is no tenderness. There is no guarding.  Musculoskeletal: Normal range of motion.  Lymphadenopathy:    She has no cervical adenopathy.  Neurological: She is alert and oriented to person, place, and time. No sensory deficit. She exhibits normal muscle tone. Coordination normal.  Skin: Skin is warm and dry. No rash noted.  Nursing note and vitals reviewed.    ED Treatments / Results  Labs (all labs ordered are listed, but only abnormal results are displayed) Labs Reviewed - No data to display  EKG  EKG Interpretation None       Radiology No results found.  Procedures Procedures (including critical care time)  Medications Ordered in ED Medications - No data to display   Initial Impression / Assessment and Plan / ED Course  I have reviewed the triage vital signs and the nursing notes.  Pertinent labs & imaging results that were available during my care of the patient were reviewed by me and considered in my medical decision making (see chart for details).     Pt well appearing, non-toxic.  Likely viral pharyngitis.  No concerning sx's for PTA or pharyngeal abscess. Pt agrees to symptomatic tx and return precautions discussed.  Final Clinical Impressions(s) / ED Diagnoses   Final diagnoses:  Sore throat    New Prescriptions Discharge Medication List as of 06/09/2016  2:31 PM    START taking these medications   Details  ibuprofen (ADVIL,MOTRIN) 600 MG tablet Take 1 tablet (600 mg total) by mouth every 6 (six) hours as needed., Starting Thu 06/09/2016, Print    magic mouthwash w/lidocaine SOLN Take 5 mLs by mouth 3 (three) times daily as needed for mouth pain. Swish and spit, do not swallow, Starting Thu 06/09/2016, Print         Trisha Mangle [place], PA-C 06/11/16 2102    Eber Hong, MD 06/12/16 [phone]

## 2016-07-26 ENCOUNTER — Ambulatory Visit: Payer: Self-pay | Admitting: Family Medicine

## 2016-07-26 DIAGNOSIS — F325 Major depressive disorder, single episode, in full remission: Secondary | ICD-10-CM

## 2016-07-26 DIAGNOSIS — F1410 Cocaine abuse, uncomplicated: Secondary | ICD-10-CM | POA: Insufficient documentation

## 2016-07-26 DIAGNOSIS — F319 Bipolar disorder, unspecified: Secondary | ICD-10-CM | POA: Insufficient documentation

## 2016-07-26 DIAGNOSIS — F1411 Cocaine abuse, in remission: Secondary | ICD-10-CM

## 2016-09-02 ENCOUNTER — Ambulatory Visit: Payer: Self-pay | Admitting: Family Medicine

## 2016-10-28 ENCOUNTER — Ambulatory Visit: Payer: Self-pay | Admitting: Family Medicine

## 2016-11-13 ENCOUNTER — Emergency Department (HOSPITAL_COMMUNITY): Payer: Medicaid Other

## 2016-11-13 ENCOUNTER — Encounter (HOSPITAL_COMMUNITY): Payer: Self-pay

## 2016-11-13 ENCOUNTER — Emergency Department (HOSPITAL_COMMUNITY)
Admission: EM | Admit: 2016-11-13 | Discharge: 2016-11-13 | Disposition: A | Discharge status: Home / Self Care | Payer: Medicaid Other | Attending: Emergency Medicine | Admitting: Emergency Medicine

## 2016-11-13 DIAGNOSIS — W109XXA Fall (on) (from) unspecified stairs and steps, initial encounter: Secondary | ICD-10-CM | POA: Insufficient documentation

## 2016-11-13 DIAGNOSIS — Y998 Other external cause status: Secondary | ICD-10-CM | POA: Insufficient documentation

## 2016-11-13 DIAGNOSIS — Y929 Unspecified place or not applicable: Secondary | ICD-10-CM | POA: Insufficient documentation

## 2016-11-13 DIAGNOSIS — S299XXA Unspecified injury of thorax, initial encounter: Secondary | ICD-10-CM | POA: Diagnosis present

## 2016-11-13 DIAGNOSIS — S60012A Contusion of left thumb without damage to nail, initial encounter: Secondary | ICD-10-CM | POA: Diagnosis not present

## 2016-11-13 DIAGNOSIS — Y9301 Activity, walking, marching and hiking: Secondary | ICD-10-CM | POA: Insufficient documentation

## 2016-11-13 DIAGNOSIS — S20212A Contusion of left front wall of thorax, initial encounter: Secondary | ICD-10-CM | POA: Diagnosis not present

## 2016-11-13 LAB — POC URINE PREG, ED: Preg Test, Ur: NEGATIVE

## 2016-11-13 MED ORDER — IBUPROFEN 600 MG PO TABS: 600.0000 mg | ORAL_TABLET | Freq: Four times a day (QID) | ORAL | 0 refills | Status: DC

## 2016-11-13 MED ORDER — MIDAZOLAM HCL 2 MG/2ML IJ SOLN
0.5000 mg | Freq: Once | INTRAMUSCULAR | Status: AC
  Administered 2016-11-13: 0.5 mg via INTRAVENOUS
  Filled 2016-11-13: qty 2

## 2016-11-13 MED ORDER — MORPHINE SULFATE (PF) 4 MG/ML IV SOLN
4.0000 mg | Freq: Once | INTRAVENOUS | Status: AC
  Administered 2016-11-13: 4 mg via INTRAVENOUS
  Filled 2016-11-13: qty 1

## 2016-11-13 MED ORDER — CYCLOBENZAPRINE HCL 10 MG PO TABS: 10.0000 mg | ORAL_TABLET | Freq: Three times a day (TID) | ORAL | 0 refills | Status: DC

## 2016-11-13 MED ORDER — ONDANSETRON HCL 4 MG/2ML IJ SOLN
4.0000 mg | Freq: Once | INTRAMUSCULAR | Status: AC
  Administered 2016-11-13: 4 mg via INTRAVENOUS
  Filled 2016-11-13: qty 2

## 2016-11-13 NOTE — Discharge Instructions (Signed)
Your vital signs within normal limits.  Your oxygen level is 97% on room air.  The x-ray of your chest shows no injury to your lungs.  The x-ray of your ribs shows no rib fracture.  The x-ray of your thumb is negative for fracture or dislocation.  I suspect that you have a deep bruise/contusion to your rib area.  Please use the incentive spirometer 3-4 times each hour.  Use Flexeril 3 times daily for spasm pain.  Use ibuprofen with breakfast, lunch, dinner, and at bedtime for pain and inflammation.  Please see Dr. Laural BenesJohnson for additional evaluation and recheck this week.

## 2016-11-13 NOTE — ED Provider Notes (Signed)
[facility] EMERGENCY DEPARTMENT Provider Note   CSN: [id number] Arrival date & time: 11/13/16  [id number]     History   Chief Complaint Chief Complaint  Patient presents with  . fall, rib pain    HPI Durwin RegesSheila A Mullins is a 46 y.o. female.  Patient is a 46 year old female who presents to the emergency department with a complaint of left rib area pain.  The patient states that she was drinking on last night.  And this morning approximately 2 hours prior to her arrival in the emergency department she fell going up some steps.  She thinks that she may have broken a rib, because she has had 2 broken ribs in the past and this feels similar.  The patient states that it hurts to take a deep breath, but she is not short of breath.  She denies being on any anticoagulation medications.  She denies any bleeding disorders.  No recent operations or procedures involving the chest.  Patient does not recall any other injury at this time.  She denies hitting her head.      Past Medical History:  Diagnosis Date  . Anxiety    Pt reports anxiety  . Migraine   . Sleep apnea     Patient Active Problem List   Diagnosis Date Noted  . Cocaine abuse in remission (HCC) 07/26/2016  . Major depression in remission (HCC) 07/26/2016  . Gonorrhea 10/20/2014  . IFG (impaired fasting glucose) 10/15/2014  . Anemia 10/15/2014  . Migraine 10/15/2014    Past Surgical History:  Procedure Laterality Date  . CESAREAN SECTION  2011  . HERNIA REPAIR  2012  . NO PAST SURGERIES     C-section    OB History    No data available       Home Medications    Prior to Admission medications   Medication Sig Start Date End Date Taking? Authorizing Provider  amoxicillin-clavulanate (AUGMENTIN) 875-125 MG tablet Take 1 tablet by mouth 2 (two) times daily. 02/26/15   Johnson, Megan P, DO  ibuprofen (ADVIL,MOTRIN) 600 MG tablet Take 1 tablet (600 mg total) by mouth every 6 (six) hours as needed. 06/09/16   Triplett,  Tammy, PA-C  magic mouthwash w/lidocaine SOLN Take 5 mLs by mouth 3 (three) times daily as needed for mouth pain. Swish and spit, do not swallow 06/09/16   Triplett, Tammy, PA-C  naproxen (NAPROSYN) 250 MG tablet Take 1 tablet (250 mg total) by mouth 2 (two) times daily with a meal. 02/19/15   Dorcas CarrowJohnson, Megan P, DO    Family History Family History  Problem Relation Age of Onset  . Cancer Sister        Ovarian  . Cancer Maternal Grandmother        Leukemia  . Asthma Mother   . Allergies Mother     Social History Social History   Tobacco Use  . Smoking status: Never Smoker  . Smokeless tobacco: Never Used  Substance Use Topics  . Alcohol use: Yes    Comment: occasionally  . Drug use: No    Comment: crack- 1 week ago     Allergies   Flagyl [metronidazole]   Review of Systems Review of Systems  Constitutional: Negative for activity change.       All ROS Neg except as noted in HPI  HENT: Negative for nosebleeds.   Eyes: Negative for photophobia and discharge.  Respiratory: Negative for cough, shortness of breath and wheezing.  Chest wall pain  Cardiovascular: Negative for chest pain and palpitations.  Gastrointestinal: Negative for abdominal pain and blood in stool.  Genitourinary: Negative for dysuria, frequency and hematuria.  Musculoskeletal: Negative for arthralgias, back pain and neck pain.  Skin: Negative.   Neurological: Negative for dizziness, seizures and speech difficulty.  Psychiatric/Behavioral: Negative for confusion and hallucinations.     Physical Exam Updated Vital Signs BP (!) 117/93 (BP Location: Left Arm)   Pulse 74   Resp 18   Ht 5\' 4"  (1.626 m)   Wt 61.2 kg (135 lb)   LMP 10/30/2016   SpO2 97%   BMI 23.17 kg/m   Physical Exam  Constitutional: She is oriented to person, place, and time. She appears well-developed and well-nourished.  Non-toxic appearance.  HENT:  Head: Normocephalic.  Right Ear: Tympanic membrane and external ear  normal.  Left Ear: Tympanic membrane and external ear normal.  Eyes: EOM and lids are normal. Pupils are equal, round, and reactive to light.  Neck: Normal range of motion. Neck supple. Carotid bruit is not present.  Cardiovascular: Normal rate, regular rhythm, normal heart sounds, intact distal pulses and normal pulses.  Pulmonary/Chest: Breath sounds normal. No respiratory distress.  Chaperone present during examination.  There is symmetrical rise and fall of the chest.  There is no bruising noted of the left chest wall.  There is no sternal area tenderness.  There is tenderness of the left mid rib area.  There is no bruising of the breast.  There is no bleeding from the nipple area.  Patient speaks in complete sentences without problem.  Abdominal: Soft. Bowel sounds are normal. There is no tenderness. There is no guarding.  Musculoskeletal: Normal range of motion.  There is a small bruise area of the right wrist.  There is full range of motion of the fingers, wrist, elbow, and shoulder on the right.  There is bruising and swelling of the MP joint of the left thumb.  There is full range of motion of the wrist, elbow, shoulder on the left.  There is no pain with movement or rocking of the pelvis, and there is no pain with range of motion of the right or left lower extremities.  Lymphadenopathy:       Head (right side): No submandibular adenopathy present.       Head (left side): No submandibular adenopathy present.    She has no cervical adenopathy.  Neurological: She is alert and oriented to person, place, and time. She has normal strength. No cranial nerve deficit or sensory deficit.  Skin: Skin is warm and dry.  Psychiatric: She has a normal mood and affect. Her speech is normal.  Nursing note and vitals reviewed.    ED Treatments / Results  Labs (all labs ordered are listed, but only abnormal results are displayed) Labs Reviewed  POC URINE PREG, ED    EKG  EKG  Interpretation None       Radiology No results found.  Procedures Procedures (including critical care time)  Medications Ordered in ED Medications  morphine 4 MG/ML injection 4 mg (not administered)  midazolam (VERSED) injection 0.5 mg (not administered)  ondansetron (ZOFRAN) injection 4 mg (not administered)     Initial Impression / Assessment and Plan / ED Course  I have reviewed the triage vital signs and the nursing notes.  Pertinent labs & imaging results that were available during my care of the patient were reviewed by me and considered in my medical decision  making (see chart for details).       Final Clinical Impressions(s) / ED Diagnoses MDM Vital signs within normal limits.  Pulse oximetry is 97% on room air.  X-ray of the chest and ribs are negative for acute problem.  Patient will use incentive spirometry 3-4 times each hour.  Patient will be treated with Flexeril and ibuprofen.  The patient is to follow-up with Dr. Laural Benes her primary care sometime this week for follow-up and recheck.  Patient is in agreement with this plan.   Final diagnoses:  Rib contusion, left, initial encounter  Contusion of left thumb without damage to nail, initial encounter    ED Discharge Orders    None       Ivery Quale, PA-C 11/13/16 1610    Marily Memos, MD 11/13/16 [phone]
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
## 2016-11-22 ENCOUNTER — Telehealth: Payer: Self-pay | Admitting: Family Medicine

## 2016-11-22 NOTE — Telephone Encounter (Signed)
Pt called requesting follow up appt with PCP per ED instruction on 11/13/16 fpr rib contusion; pt previously seen by Olevia PerchesMegan Johnson last seen 02/16/15; spoke with Ephriam Knuckleshristian [facility] and she stated per Dr Laural BenesJohnson, pt can be seen by any provider at that office; Ephriam Knuckleshristian scheduled with appt with Dr Maurice MarchLane 11/23/16 at 1030.

## 2016-11-23 ENCOUNTER — Telehealth: Payer: Self-pay | Admitting: Family Medicine

## 2016-11-23 ENCOUNTER — Encounter: Payer: Self-pay | Admitting: Family Medicine

## 2016-11-23 ENCOUNTER — Ambulatory Visit (INDEPENDENT_AMBULATORY_CARE_PROVIDER_SITE_OTHER): Payer: Medicaid Other | Admitting: Family Medicine

## 2016-11-23 VITALS — BP 142/94 | HR 70 | Temp 97.7°F | Wt 133.0 lb

## 2016-11-23 DIAGNOSIS — S20212D Contusion of left front wall of thorax, subsequent encounter: Secondary | ICD-10-CM | POA: Diagnosis not present

## 2016-11-23 MED ORDER — NAPROXEN 500 MG PO TABS: 500.0000 mg | ORAL_TABLET | Freq: Two times a day (BID) | ORAL | 0 refills | Status: DC

## 2016-11-23 MED ORDER — LIDOCAINE 5 % EX PTCH: 1.0000 | MEDICATED_PATCH | CUTANEOUS | 0 refills | Status: DC

## 2016-11-23 NOTE — Telephone Encounter (Signed)
Copied from CRM [phone]. Topic: Quick Communication - See Telephone Encounter >> Nov 23, 2016  4:07 PM Guinevere FerrariMorris, Caedyn Raygoza E, NT wrote: CRM for notification. See Telephone encounter for: Pt is calling to see if she can get her prescription be [facility] [address] [place] and [facility] [place] [facility]. Prescription is naproxen (NAPROSYN) 500 MG tablet and  lidocaine (LIDODERM) 5 %         11/23/16.
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
## 2016-11-26 NOTE — Patient Instructions (Signed)
Follow up as needed

## 2017-02-03 ENCOUNTER — Encounter: Payer: Self-pay | Admitting: Family Medicine

## 2017-02-03 ENCOUNTER — Other Ambulatory Visit: Payer: Self-pay

## 2017-02-03 ENCOUNTER — Other Ambulatory Visit (HOSPITAL_COMMUNITY)
Admission: RE | Admit: 2017-02-03 | Discharge: 2017-02-03 | Disposition: A | Discharge status: Home / Self Care | Payer: Medicaid Other | Source: Ambulatory Visit | Attending: Family Medicine | Admitting: Family Medicine

## 2017-02-03 ENCOUNTER — Ambulatory Visit: Payer: Medicaid Other | Admitting: Family Medicine

## 2017-02-03 ENCOUNTER — Telehealth: Payer: Self-pay

## 2017-02-03 VITALS — BP 150/88 | HR 76 | Temp 98.1°F | Resp 16 | Ht 63.0 in | Wt 134.0 lb

## 2017-02-03 DIAGNOSIS — F39 Unspecified mood [affective] disorder: Secondary | ICD-10-CM | POA: Diagnosis not present

## 2017-02-03 DIAGNOSIS — N912 Amenorrhea, unspecified: Secondary | ICD-10-CM | POA: Diagnosis not present

## 2017-02-03 DIAGNOSIS — F319 Bipolar disorder, unspecified: Secondary | ICD-10-CM

## 2017-02-03 DIAGNOSIS — Z113 Encounter for screening for infections with a predominantly sexual mode of transmission: Secondary | ICD-10-CM | POA: Diagnosis not present

## 2017-02-03 DIAGNOSIS — F1490 Cocaine use, unspecified, uncomplicated: Secondary | ICD-10-CM | POA: Diagnosis not present

## 2017-02-03 DIAGNOSIS — R7303 Prediabetes: Secondary | ICD-10-CM | POA: Diagnosis not present

## 2017-02-03 DIAGNOSIS — R5383 Other fatigue: Secondary | ICD-10-CM

## 2017-02-03 DIAGNOSIS — I10 Essential (primary) hypertension: Secondary | ICD-10-CM | POA: Diagnosis not present

## 2017-02-03 DIAGNOSIS — Z23 Encounter for immunization: Secondary | ICD-10-CM | POA: Diagnosis not present

## 2017-02-03 DIAGNOSIS — G43709 Chronic migraine without aura, not intractable, without status migrainosus: Secondary | ICD-10-CM

## 2017-02-03 LAB — POCT URINE PREGNANCY: PREG TEST UR: NEGATIVE

## 2017-02-03 MED ORDER — NAPROXEN 500 MG PO TABS: 500.0000 mg | ORAL_TABLET | Freq: Two times a day (BID) | ORAL | 0 refills | Status: DC | PRN

## 2017-02-03 MED ORDER — AMLODIPINE BESYLATE 5 MG PO TABS: 5.0000 mg | ORAL_TABLET | Freq: Every day | ORAL | 3 refills | Status: DC
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
## 2017-02-03 NOTE — Telephone Encounter (Signed)
Patient was [facility].  Patient has an appt with Dr. Vanetta ShawlHisada on 02-24-2017

## 2017-02-03 NOTE — Progress Notes (Signed)
Patient ID: TA FAIR, female    DOB: [birth year], 47 y.o.   MRN: [id number]  Chief Complaint  Patient presents with  . Establish Care  . Hypertension    Allergies Flagyl [metronidazole]  Subjective:   Paula Mullins is a 47 y.o. female who presents to [facility] today.  HPI Paula Mullins presents today as a new patient visit to establish care.  She reports that she was prompted to come in today because she was due to start menses in January and has missed her period.  She reports that she has never missed a menses in the past.  She has had a bilateral tubal ligation.  She reports that she was concerned because 1 of her sisters had an ectopic pregnancy and she wanted to make sure she was not pregnant.  She denies any symptoms associated with pregnancy.  She denies any breast tenderness, nausea, or abdominal enlargement.  She reports that she could be going through menopause and just wanted to have this checked up on.  She reports that she has a younger sister who has gone through menopause.  She does report some night sweats from time to time.  Patient reports that she never really realize she had high blood pressure.  Review of her chart reveals multiple readings of elevated blood pressure at her previous primary care doctor and in the emergency department.  She denies any chest pain, shortness of breath, swelling in her extremities.  She also gives a history that she has a prior history of drug addiction.  She denies any history of IV drug use or opioid use.  She does report that she smokes crack cocaine several times a week.  She reports that years ago she was on crack cocaine and then was subsequently clean for several years.  She reports that when her partner/father of her last child was incarcerated in March 2018 she started smoking crack cocaine again.  He denies any tobacco or alcohol use.  She denies any other recreational drug use.  She reports that her family is  aware that she is doing drugs at this time.  She reports that she does not have a house of her own right now but is living with friends and/or her family.  Her youngest son is currently in custody of her sister-in-law in [place].  She reports that she has been to [facility] in the past for recovery and substance abuse.  She has also been treated by behavioral health for bipolar disorder.  She reports that she has been on mood stabilizers and Seroquel in the past.  She reports she is currently not on any medications for her mood but when feels like she is not able to sleep well she will [place] some Seroquel from her friends.  He denies any suicidal any auditory or visual hallucinations.  She tells me that other than bipolar she has no other mental health diagnoses.  She denies a history of schizophrenia.  She does report that she has had some probable nervous breakdowns in the past.  She did used to live in [place] and reports at one point in her life she got fed up and bored with being a mom and so she just left home.  She was thought to be missing for some time and tells me that her picture was on a missing person's billboard.  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  She reports that in 2015 he was told that she was prediabetic.  Reports she has not had any blood work done to evaluate this in a long time.  She reports that her weight has been up and down over the past several years.  She reports that her energy is pretty good.  Appetite is good.  She reports that she raised her arm and her shoulder started hurting this morning but other than that she has no muscle aches or pains.  She does give a history of migraine headaches.  She has been on Naprosyn for these in the past.  She reports that she gets these headaches more frequently associated with her menses.  She reports she would like a refill on this even though she is not had a  period in a while.  She reports that she occasionally does still get a migraine headache.  She denies any aura with these headaches.  They are not the worst headaches of her life.  She has had no change in the character of these headaches.  They occur bilaterally across the top of her head.  No associated vision changes, numbness or tingling in her extremities, or eye watering.   Patient reports that she is sexually active.  She denies any pelvic pain or abnormal vaginal discharge.  She reports that she would like to be screened for any sexually transmitted diseases.      Migraine   This is a chronic problem. The current episode started more than 1 year ago. The problem occurs intermittently. The problem has been unchanged. The pain is located in the frontal region. The pain does not radiate. The pain quality is similar to prior headaches. The quality of the pain is described as aching and squeezing. The pain is at a severity of 5/10. The pain is moderate. Associated symptoms include weight loss. Pertinent negatives include no abdominal pain, abnormal behavior, anorexia, back pain, blurred vision, coughing, dizziness, drainage, eye redness, eye watering, facial sweating, fever, hearing loss, insomnia, loss of balance, nausea, numbness, phonophobia, photophobia, rhinorrhea, seizures, sinus pressure, sore throat, swollen glands, tinnitus, vomiting or weakness. The symptoms are aggravated by menstrual cycle and emotional stress. She has tried NSAIDs for the symptoms. The treatment provided significant relief. Her past medical history is significant for migraine headaches and migraines in the family. There is no history of cancer, cluster headaches or recent head traumas.   Past Medical History:  Diagnosis Date  . Anxiety    Pt reports anxiety  . Migraine   . Sleep apnea     Past Surgical History:  Procedure Laterality Date  . CESAREAN SECTION  2011  . HERNIA REPAIR  2012  . TUBAL LIGATION       Family History  Problem Relation Age of Onset  . Cancer Sister        Ovarian  . Cancer Maternal Grandmother        Leukemia  . Asthma Mother   . Allergies Mother      Social History   Socioeconomic History  . Marital status: Legally Separated    Spouse name: None  . Number of children: 8  . Years of education: None  . Highest education level: None  Social Needs  . Financial resource strain: Very hard  . Food insecurity - worry: Never true  . Food insecurity - inability: Never true  . Transportation needs - medical: Yes  . Transportation needs - non-medical: Yes  Occupational History  . Occupation: Unemployed  Tobacco Use  . Smoking status: Never Smoker  . Smokeless tobacco: Never Used  Substance and Sexual Activity  . Alcohol use: Yes    Comment: occasionally  . Drug use: Yes    Frequency: 1.0 times per week    Types: Cocaine    Comment: patient has used within the last week  . Sexual activity: Yes    Birth control/protection: Surgical  Other Topics Concern  . None  Social History Narrative   Live in [place], [place]   Has had 8 children.    Single/separated.   3 kids at home.    Current Outpatient Medications on File Prior to Visit  Medication Sig Dispense Refill  . cyclobenzaprine (FLEXERIL) 10 MG tablet Take 1 tablet (10 mg total) 3 (three) times daily by mouth. 20 tablet 0  . lidocaine (LIDODERM) 5 % Place 1 patch onto the skin daily. Remove & Discard patch within 12 hours or as directed by MD (Patient not taking: Reported on 02/03/2017) 30 patch 0  . magic mouthwash w/lidocaine SOLN Take 5 mLs by mouth 3 (three) times daily as needed for mouth pain. Swish and spit, do not swallow (Patient not taking: Reported on 02/03/2017) 100 mL 0  . naproxen (NAPROSYN) 250 MG tablet Take 1 tablet (250 mg total) by mouth 2 (two) times daily with a meal. (Patient not taking: Reported on 02/03/2017) 60 tablet 3   No current facility-administered medications on file prior to  visit.     Review of Systems  Constitutional: Positive for weight loss. Negative for activity change, appetite change, fever and unexpected weight change.  HENT: Negative for congestion, dental problem, hearing loss, mouth sores, nosebleeds, postnasal drip, rhinorrhea, sinus pressure, sinus pain, sneezing, sore throat, tinnitus, trouble swallowing and voice change.   Eyes: Negative for blurred vision, photophobia, redness and visual disturbance.  Respiratory: Negative for cough, chest tightness and shortness of breath.   Cardiovascular: Negative for chest pain, palpitations and leg swelling.  Gastrointestinal: Negative for abdominal pain, anorexia, nausea and vomiting.  Genitourinary: Negative for dysuria, frequency and urgency.  Musculoskeletal: Negative for back pain and neck stiffness.  Skin: Negative for rash.  Neurological: Positive for headaches. Negative for dizziness, tremors, seizures, syncope, facial asymmetry, weakness, light-headedness, numbness and loss of balance.       Has a history of migraine headaches.  No current headache at this time.  Hematological: Negative for adenopathy. Does not bruise/bleed easily.  Psychiatric/Behavioral: Positive for decreased concentration and sleep disturbance. The patient does not have insomnia.        Patient reports that her mood is currently stable.  She reports from time to time she does get agitated and will have highs or lows.  Denies that now denies any suicidal or homicidal.  Reports she does have trouble with sleep and does not always sleep well.     Objective:   BP (!) 150/88 (BP Location: Left Arm, Patient Position: Sitting, Cuff Size: Normal)   Pulse 76   Temp 98.1 F (36.7 C) (Temporal)   Resp 16   Ht 5\' 3"  (1.6 m)   Wt 134 lb (60.8 kg)   LMP 12/08/2016 (Exact Date)   SpO2 98%   BMI 23.74 kg/m   Physical Exam  Constitutional: She is oriented to person, place, and time. She appears well-developed and well-nourished. No  distress.  HENT:  Head: Normocephalic and atraumatic.  Right Ear: External ear normal.  Left Ear: External ear  normal.  Mouth/Throat: Oropharynx is clear and moist. No oropharyngeal exudate.  Eyes: Conjunctivae are normal. Pupils are equal, round, and reactive to light. No scleral icterus.  Neck: Normal range of motion. Neck supple. No JVD present. No tracheal deviation present. No thyromegaly present.  Cardiovascular: Normal rate, regular rhythm and normal heart sounds.  Pulmonary/Chest: Effort normal and breath sounds normal. No respiratory distress.  Abdominal: Soft. Bowel sounds are normal. She exhibits no distension and no mass. There is no tenderness. There is no guarding.  Lymphadenopathy:    She has no cervical adenopathy.  Neurological: She is alert and oriented to person, place, and time. No cranial nerve deficit.  Skin: Skin is warm and dry.  Psychiatric: Her speech is normal and behavior is normal. Judgment and thought content normal. Her affect is labile. She is not actively hallucinating. Cognition and memory are normal. She expresses no homicidal and no suicidal ideation. She expresses no suicidal plans and no homicidal plans.  Patient is alert and oriented.  Mood seemingly somewhat normal.  Thought processes and her story is somewhat difficult to follow at times.  Speech slightly rapid.  No slurring.  Behavior normal.  No suicidal or homicidal ideations.  No obvious delusions, phobias, was obsessions, or compulsions.  Memory intact to immediate recent and remote recall.  Judgment appears to be normal.  She does get tearful during discussion about her prior life events and behaviors. She is attentive.  Nursing note and vitals reviewed.  Depression screen [facility] 2/9 02/03/2017 02/03/2017 10/28/2014  Decreased Interest 2 0 0  Down, Depressed, Hopeless 3 0 0  PHQ - 2 Score 5 0 0  Altered sleeping 3 - -  Tired, decreased energy 2 - -  Change in appetite 1 - -  Feeling bad or failure  about yourself  2 - -  Trouble concentrating 0 - -  Moving slowly or fidgety/restless 0 - -  Suicidal thoughts 0 - -  PHQ-9 Score 13 - -      Assessment and Plan  1. Mood disorder [facility](HCC) Patient with previous history of mood disorder of bipolar disorder.  Patient has previous history of substance abuse and has been in inpatient and outpatient rehab in the past.  Today at behavioral health crisis consult was initiated in the office.  Patient did speak with behavioral health nurse via electronic/computer visit.  Patient was counseled today.  Behavioral health contact was initiated and patient is going to most likely seek outpatient rehab near 1 of her daughter's homes in [place].  Patient was agreeable to this today.  She is interested in getting treatment for her mood and her substance abuse.  She was counseled that if she felt she was a harm to herself, develop suicidal or homicidal ideations or had any abrupt changes in her mood that she could go to the emergency department or call 911 for help.  She voiced understanding - Ambulatory referral to Psychiatry  2. Crack cocaine use With history of crack cocaine use in the past and currently using for the past 7-8 months.  We did discuss the harms to her mental and physical health abusing this drug.  She is interested in getting help to quit at this time.  Behavioral health consult was initiated.  She will not begin services here due to the fact that she does not have a car.  Patient will be followed up by behavioral health nurse and will get assistance for outpatient rehab. - Ambulatory referral to Psychiatry  3. HTN, goal below 140/90 Blood pressure uncontrolled at this time.  Start amlodipine 10 mg p.o. daily.  Check baseline labs. Lifestyle modifications discussed with patient including a diet emphasizing vegetables, fruits, and whole grains. Limiting intake of sodium to less than 2,400 mg per day.  Recommendations discussed include consuming  low-fat dairy products, poultry, fish, legumes, non-tropical vegetable oils, and nuts; and limiting intake of sweets, sugar-sweetened beverages, and red meat. Discussed following a plan such as the Dietary Approaches to Stop Hypertension (DASH) diet. Patient to read up on this diet.   - amLODipine (NORVASC) 5 MG tablet; Take 1 tablet (5 mg total) by mouth daily.  Dispense: 90 tablet; Refill: 3  4. Screen for STD (sexually transmitted disease) Laboratory testing done at patient request. - HIV antibody - RPR - Hepatitis panel, acute - Urine cytology ancillary only  5. Amenorrhea Discussed with patient that most likely cause of her amenorrhea is secondary to menopausal timeframe.  Pregnancy test was done in office today which was negative.  Patient was counseled concerning menopausal symptoms, cessation of menses, and management of symptoms.  We will discuss this more in detail at next visit. - TSH - POCT urine pregnancy  6. Fatigue, unspecified type  - CBC with Differential/Platelet - COMPLETE METABOLIC PANEL WITH GFR  7. Pre-diabetes Check labs today. - Hemoglobin A1c  8. Immunization due Vaccination performed today. - Flu Vaccine QUAD 6+ mos PF IM (Fluarix Quad PF)  9. Chronic migraine without aura without status migrainosus, not intractable Patient was counseled concerning worrisome signs and symptoms of headache and migraine.  Her migraines are consistent with her previous history. Discussed risks of cardiovascular thrombotic events related to NSAIDS. Discussed increased risk of AMI and CVA. Discussed risk of serious GI adverse events including bleeding, ulcers, and perforation. Patient understands risks of this medication.   - naproxen (NAPROSYN) 500 MG tablet; Take 1 tablet (500 mg total) by mouth 2 (two) times daily as needed.  Dispense: 40 tablet; Refill: 0   OV was 50 minutes. Greater than 50 percent of OV spent counseling and coordinating care.  Return in about 3 weeks  (around 02/24/2017) for blood pressure. Aliene Beams, MD 02/03/2017

## 2017-02-06 ENCOUNTER — Telehealth (HOSPITAL_COMMUNITY): Payer: Self-pay

## 2017-02-06 ENCOUNTER — Telehealth: Payer: Self-pay | Admitting: Family Medicine

## 2017-02-06 NOTE — Telephone Encounter (Signed)
Per Dr, Vanetta ShawlHisada, writer spoke to the patent and her appt has been cancelled and [facility] abuse and mental health appointment.s.

## 2017-02-06 NOTE — Telephone Encounter (Signed)
I got an e-mail from [name], who spoke to the patient at her 02/03/17 office visit. Paula Mullins spoke to patient via the Telepsych monitor. She e-mailed me to inform that the patient is seeing Dr. Vanetta ShawlHisada 02/24/17 and will be [facility] substance abuse counseling.

## 2017-02-07 LAB — URINE CYTOLOGY ANCILLARY ONLY
Chlamydia: NEGATIVE
Neisseria Gonorrhea: NEGATIVE
Trichomonas: POSITIVE — AB

## 2017-02-09 ENCOUNTER — Telehealth: Payer: Self-pay | Admitting: Family Medicine

## 2017-02-09 LAB — URINE CYTOLOGY ANCILLARY ONLY
BACTERIAL VAGINITIS: NEGATIVE
CANDIDA VAGINITIS: NEGATIVE

## 2017-02-09 NOTE — Telephone Encounter (Signed)
Please call and advise that patient has tested positive for trichomonas .Advise that will need to discuss options of medication due to the fact that she is allergic to flagyl. Please confirm whether she is allergic or not to metronidazole.    Advise that this is a sexually transmitted disease caused by trichomonas vaginalis infection. Advise that it is transmitted through sexual contact of an infected partner. This can be cured with antibiotics. Advise that she should abstain from sexual activity for seven days after completion of antibiotics to prevent spreading the infection. It is important to take all the medication. Do not share the medication with others. If symptoms persists for more than 2 days after receiving treatment, the should follow up for reevaluation. Patient needs to tell all recent anal, vaginal, or oral sex partners (people within the past 60 days) so that they can be seen by a health care provider and get treatment. Please advise that patient will need to follow up to be retested in about three months after treatment of this infection. Advise that latex female condoms, when used consistently and correctly, can reduce the risk of getting or giving an STD. The surest way to avoid infection is to abstain from vaginal, anal, and oral sex, or to be in a long-term mutually monogamous relationship with a partner who has been tested and is known to be uninfected.

## 2017-02-10 NOTE — Telephone Encounter (Signed)
Patient informed of message below, verbalized understanding. She states she is allergic to metronidazole. She states she gets blotchy red and swells up in her ankles.

## 2017-02-14 NOTE — Telephone Encounter (Signed)
Please advise patient that I will need her to follow up and discuss treatment options. Please ask her to schedule follow up.

## 2017-02-15 NOTE — Telephone Encounter (Signed)
Called patient regarding message below. No answer, unable to leave message.  

## 2017-02-16 NOTE — Telephone Encounter (Signed)
Called patient regarding message below. No answer, unable to leave message.  

## 2017-02-17 NOTE — Telephone Encounter (Signed)
Called patient regarding message below. No answer, left generic message for patient to return call.   

## 2017-02-20 NOTE — Telephone Encounter (Signed)
I have been unable to reach this patient by phone.  A letter is being sent.

## 2017-02-24 ENCOUNTER — Ambulatory Visit (HOSPITAL_COMMUNITY): Payer: Self-pay | Admitting: Psychiatry

## 2017-03-02 ENCOUNTER — Ambulatory Visit: Payer: Self-pay | Admitting: Family Medicine

## 2017-06-09 ENCOUNTER — Encounter: Payer: Self-pay | Admitting: Family Medicine

## 2018-01-02 ENCOUNTER — Encounter: Payer: Self-pay | Admitting: *Deleted

## 2018-01-02 ENCOUNTER — Encounter: Payer: Self-pay | Admitting: Obstetrics and Gynecology

## 2018-03-01 ENCOUNTER — Ambulatory Visit: Payer: Self-pay | Admitting: Family Medicine

## 2018-03-01 ENCOUNTER — Telehealth: Payer: Self-pay | Admitting: Family Medicine

## 2018-03-01 NOTE — Telephone Encounter (Signed)
Patient called and wanted to establish care with our NP Tereasa Coop.  I have called the patient back to let her know we will not accept her as a new patient.

## 2018-03-21 ENCOUNTER — Ambulatory Visit: Payer: Self-pay | Admitting: Nurse Practitioner

## 2018-03-30 ENCOUNTER — Ambulatory Visit: Payer: Self-pay | Admitting: Nurse Practitioner

## 2018-08-03 ENCOUNTER — Other Ambulatory Visit: Payer: Self-pay

## 2018-08-03 DIAGNOSIS — Z20822 Contact with and (suspected) exposure to covid-19: Secondary | ICD-10-CM

## 2018-08-05 LAB — NOVEL CORONAVIRUS, NAA: SARS-CoV-2, NAA: NOT DETECTED

## 2018-08-10 ENCOUNTER — Ambulatory Visit (INDEPENDENT_AMBULATORY_CARE_PROVIDER_SITE_OTHER): Payer: Self-pay | Admitting: Nurse Practitioner

## 2018-08-10 ENCOUNTER — Other Ambulatory Visit: Payer: Self-pay

## 2018-08-10 ENCOUNTER — Encounter: Payer: Self-pay | Admitting: Nurse Practitioner

## 2018-08-10 DIAGNOSIS — R7301 Impaired fasting glucose: Secondary | ICD-10-CM

## 2018-08-10 DIAGNOSIS — Z780 Asymptomatic menopausal state: Secondary | ICD-10-CM

## 2018-08-10 DIAGNOSIS — F319 Bipolar disorder, unspecified: Secondary | ICD-10-CM

## 2018-08-10 DIAGNOSIS — F1410 Cocaine abuse, uncomplicated: Secondary | ICD-10-CM

## 2018-08-10 DIAGNOSIS — I10 Essential (primary) hypertension: Secondary | ICD-10-CM

## 2018-08-10 DIAGNOSIS — Z7251 High risk heterosexual behavior: Secondary | ICD-10-CM

## 2018-08-10 MED ORDER — TRAZODONE HCL 50 MG PO TABS: 25.0000 mg | ORAL_TABLET | Freq: Every evening | ORAL | 3 refills | Status: DC | PRN

## 2018-08-10 MED ORDER — AMLODIPINE BESYLATE 5 MG PO TABS: 5.0000 mg | ORAL_TABLET | Freq: Every day | ORAL | 3 refills | Status: DC

## 2018-08-10 NOTE — Progress Notes (Signed)
New Patient Office Visit  Subjective:  Patient ID: Paula Mullins, female    DOB: 06/06/1970  Age: 48 y.o. MRN: [id number]  CC:  Chief Complaint  Patient presents with  . Establish Care    pt states she thinks she may me beginning menopause    . This visit was completed via telephone due to the restrictions of the COVID-19 pandemic. All issues as above were discussed and addressed but no physical exam was performed. If it was felt that the patient should be evaluated in the office, they were directed there. The patient verbally consented to this visit. Patient was unable to complete an audio/visual visit due to Technical difficulties,Lack of internet. Due to the catastrophic nature of the COVID-19 pandemic, this visit was done through audio contact only. . Location of the patient: home . Location of the provider: home . Those involved with this call:  . Provider: Aura DialsJolene Turkessa Ostrom, DNP . CMA: Tiffany Reel, CMA . Front Desk/Registration: Harriet PhoJoliza Johnson  . Time spent on call: 30 minutes on the phone discussing health concerns. 15 minutes total spent in review of patient's record and preparation of their chart.  . I verified patient identity using two factors (patient name and date of birth). Patient consents verbally to being seen via telemedicine visit today.   HPI Paula RegesSheila A Mullins presents for new patient visit to establish care.  Introduced to Publishing rights managernurse practitioner role and practice setting.  All questions answered. Recently followed [facility] Trac[facility]agler [facility], she has previously been following [facility].  Has a lengthy psychiatric and substance abuse history.    MENOPAUSAL SYMPTOMS Symptoms started this year. Has not had a pap smear since 2011. Gravida/Para: 12/8 Duration: stable Symptom severity: mild Hot flashes: yes Night sweats: no Sleep disturbances: yes Vaginal dryness: no Dyspareunia:no Decreased libido: no Emotional lability: no  Stress incontinence: no Previous HRT/pharmacotherapy: no Hysterectomy: no Average interval between menses: varies at this time, sometimes a few weeks and other times longer Length of menses: varies at this time, sometimes > 7 days and sometimes less Flow: medium Dysmenorrhea: none GYN surgery: none Absolute Contraindications to Hormonal Therapy:     Undiagnosed vaginal bleeding: no    Breast cancer: no    Endometrial cancer: no    Coronary disease: no    Cerebrovascular disease: no    Venous thromboembolic disease: no   BIPOLAR DISORDER Has a lengthy psychiatric and substance abuse history.  She did have a behavioral health appointment last on 02/03/17 with Ava Elisabeth MostStevenson a therapist through [facility].  During that visit she did endorse to using crack cocaine a week prior and reported using approx. $60.00 worth weekly.  She would a month supply of Trazodone. Mood status: stable Satisfied with current treatment?: no current medications at this time, did well with Seroquel and Trazodone in past Symptom severity: mild  Psychotherapy/counseling: has in the past, with [facility] Previous psychiatric medications: Trazodone, Lexapro, Seroquel Depressed mood: no Anxious mood: yes Anhedonia: no Significant weight loss or gain: no Insomnia: yes hard to stay asleep Fatigue: no Feelings of worthlessness or guilt: no Impaired concentration/indecisiveness: yes Suicidal ideations: no Hopelessness: no Crying spells: no Depression screen [facility] 2/9 08/10/2018 02/03/2017 02/03/2017 10/28/2014  Decreased Interest 0 2 0 0  Down, Depressed, Hopeless 0 3 0 0  PHQ - 2 Score 0 5 0 0  Altered sleeping 2 3 - -  Tired, decreased energy 2 2 - -  Change in appetite 0 1 - -  Feeling bad or failure about yourself  0 2 - -  Trouble concentrating 0 0 - -  Moving slowly or fidgety/restless 0 0 - -  Suicidal thoughts 0 0 - -  PHQ-9 Score 4 13 - -  Difficult doing work/chores Not difficult at all -  - -   GAD 7 : Generalized Anxiety Score 08/10/2018 02/03/2017  Nervous, Anxious, on Edge 1 1  Control/stop worrying 1 1  Worry too much - different things 1 1  Trouble relaxing 0 2  Restless 0 1  Easily annoyed or irritable 1 0  Afraid - awful might happen 0 1  Total GAD 7 Score 4 7  Anxiety Difficulty Not difficult at all -    SUBSTANCE ABUSE DISORDER: Has attended [facility] for rehab services before.  Her last admission was in 2010. Reports she was trying to get into rehab recently, but has had difficulty due to lack of transportation and currently being homeless, states she is between homes.  Her daughter is able to drive her places on occasion, but no consistently.  Currently using about $60 a week of cocaine, this is estimation as she does not pay for it but obtains it from friends.  Back in the day she did give sexual favors to get cocaine, but denies doing this anymore.    HYPERTENSION States her sister is a Marine scientist and has occasionally take her BP, which is running around 160/90-100 range on average.  Was on Amlodipine, but has not been taking in some time as unable to get into an office.  Discussed at length with her importance of cessation of cocaine use due to risk for stroke or MI with elevations in BP. Hypertension status: uncontrolled  Satisfied with current treatment? no Duration of hypertension: chronic BP monitoring frequency:  weekly BP range: 160/90-100 Previous BP meds:Amlodipine Aspirin: no Recurrent headaches: no Visual changes: no Palpitations: no Dyspnea: no Chest pain: no Lower extremity edema: no Dizzy/lightheaded: no   IFG: Has family history of diabetes and her last A1C on record was 6.2% in 2016.  No labs since this time, missed labs at previous office on 02/03/17.  She denies polyuria, polydipsia, and polyphagia.    Past Medical History:  Diagnosis Date  . Anxiety    Pt reports anxiety  . Migraine   . Sleep apnea     Past Surgical History:   Procedure Laterality Date  . CESAREAN SECTION  2011  . HERNIA REPAIR  2012  . TUBAL LIGATION      Family History  Problem Relation Age of Onset  . Cancer Sister        Ovarian  . Cancer Maternal Grandmother        Leukemia  . Asthma Mother   . Allergies Mother     Social History   Socioeconomic History  . Marital status: Legally Separated    Spouse name: Not on file  . Number of children: 8  . Years of education: Not on file  . Highest education level: Not on file  Occupational History  . Occupation: Unemployed  Social Needs  . Financial resource strain: Very hard  . Food insecurity    Worry: Never true    Inability: Never true  . Transportation needs    Medical: Yes    Non-medical: Yes  Tobacco Use  . Smoking status: Never Smoker  . Smokeless tobacco: Never Used  Substance and Sexual Activity  . Alcohol  use: Yes    Comment: occasionally  . Drug use: Yes    Frequency: 1.0 times per week    Types: Cocaine    Comment: patient has used within the last week  . Sexual activity: Yes    Birth control/protection: Surgical  Lifestyle  . Physical activity    Days per week: 0 days    Minutes per session: 0 min  . Stress: Very much  Relationships  . Social Musicianconnections    Talks on phone: Once a week    Gets together: Twice a week    Attends religious service: Never    Active member of club or organization: No    Attends meetings of clubs or organizations: Never    Relationship status: Separated  . Intimate partner violence    Fear of current or ex partner: No    Emotionally abused: No    Physically abused: No    Forced sexual activity: No  Other Topics Concern  . Not on file  Social History Narrative   [place] in [place], [place]   Has had 8 children   Single/separated.   3 kids at home.     ROS Review of Systems  Constitutional: Negative for activity change, appetite change, diaphoresis, fatigue and fever.  Respiratory: Negative for cough, chest tightness,  shortness of breath and wheezing.   Cardiovascular: Negative for chest pain, palpitations and leg swelling.  Gastrointestinal: Negative for abdominal distention, abdominal pain, constipation, diarrhea, nausea and vomiting.  Endocrine: Negative for cold intolerance, heat intolerance, polydipsia, polyphagia and polyuria.  Genitourinary: Negative for vaginal bleeding and vaginal discharge.  Neurological: Negative for dizziness, syncope, weakness, light-headedness, numbness and headaches.  Psychiatric/Behavioral: Positive for decreased concentration and sleep disturbance. Negative for self-injury and suicidal ideas. The patient is nervous/anxious.     Objective:   Today's Vitals: There were no vitals taken for this visit.  Physical Exam   Unable to perform due to telephone visit only  Assessment & Plan:   Problem List Items Addressed This Visit      Cardiovascular and Mediastinum   Essential hypertension    Suspect exacerbated by substance abuse.  Have recommended complete cessation of cocaine use due to major risk for MI or stroke.  Restart Amlodipine and follow-up in 4 weeks in office for BP check and labs.      Relevant Medications   amLODipine (NORVASC) 5 MG tablet     Endocrine   IFG (impaired fasting glucose)     Check A1C in 4 weeks at in office visit and monitor.          Other   High risk sexual behavior    Would benefit from STD screening regularly.  Will obtain at in office visit.      Cocaine abuse (HCC) - Primary    Have recommended complete cessation and discussed at length risks with HTN, to include stroke or MI.  She wishes to start rehab services.  Placed referral to CCM team to assist in setting this up and helping with transportation needs due to patient homelessness.       Relevant Orders   Ambulatory referral to Chronic Care Management Services   Bipolar I disorder (HCC)    Chronic, ongoing.  Will restart Trazodone.  Script sent.  Have discussed with  patient at length that if more intense therapy is required will place psychiatry referral, [facility] and also obtain therapy at this time due to her current circumstances.  Return in 4 weeks for in office visit.      Menopause    New with symptoms since this year.  Discussed simple remedies for hot flashes, such as wearing light clothing, cool clothes, air conditioning, and avoiding specific foods and caffeine.  Plan on pap in office in 4 weeks to catch up on preventative needs.  Consider addition of Citalopram in upcoming months, which may also benefit mood.         Outpatient Encounter Medications as of 08/10/2018  Medication Sig  . amLODipine (NORVASC) 5 MG tablet Take 1 tablet (5 mg total) by mouth daily.  . traZODone (DESYREL) 50 MG tablet Take 0.5-1 tablets (25-50 mg total) by mouth at bedtime as needed for sleep.  . [DISCONTINUED] amLODipine (NORVASC) 5 MG tablet Take 1 tablet (5 mg total) by mouth daily.  . [DISCONTINUED] cyclobenzaprine (FLEXERIL) 10 MG tablet Take 1 tablet (10 mg total) 3 (three) times daily by mouth.  . [DISCONTINUED] lidocaine (LIDODERM) 5 % Place 1 patch onto the skin daily. Remove & Discard patch within 12 hours or as directed by MD (Patient not taking: Reported on 02/03/2017)  . [DISCONTINUED] magic mouthwash w/lidocaine SOLN Take 5 mLs by mouth 3 (three) times daily as needed for mouth pain. Swish and spit, do not swallow (Patient not taking: Reported on 02/03/2017)  . [DISCONTINUED] naproxen (NAPROSYN) 250 MG tablet Take 1 tablet (250 mg total) by mouth 2 (two) times daily with a meal. (Patient not taking: Reported on 02/03/2017)  . [DISCONTINUED] naproxen (NAPROSYN) 500 MG tablet Take 1 tablet (500 mg total) by mouth 2 (two) times daily as needed.   No facility-administered encounter medications on file as of 08/10/2018.    I discussed the assessment and treatment plan with the patient. The patient was provided an opportunity to ask questions  and all were answered. The patient agreed with the plan and demonstrated an understanding of the instructions.   The patient was advised to call back or seek an in-person evaluation if the symptoms worsen or if the condition fails to improve as anticipated.   I provided 30 minutes of time during this encounter.  Follow-up: Return in about 4 weeks (around 09/07/2018) for Follow-up in office with pap and labs.   Marjie SkiffJOLENE T Swayze Pries, NP

## 2018-08-10 NOTE — Assessment & Plan Note (Signed)
Have recommended complete cessation and discussed at length risks with HTN, to include stroke or MI.  She wishes to start rehab services.  Placed referral to CCM team to assist in setting this up and helping with transportation needs due to patient homelessness.

## 2018-08-10 NOTE — Patient Instructions (Signed)
Menopause Menopause is the normal time of life when menstrual periods stop completely. It is usually confirmed by 12 months without a menstrual period. The transition to menopause (perimenopause) most often happens between the ages of 45 and 55. During perimenopause, hormone levels change in your body, which can cause symptoms and affect your health. Menopause may increase your risk for:  Loss of bone (osteoporosis), which causes bone breaks (fractures).  Depression.  Hardening and narrowing of the arteries (atherosclerosis), which can cause heart attacks and strokes. What are the causes? This condition is usually caused by a natural change in hormone levels that happens as you get older. The condition may also be caused by surgery to remove both ovaries (bilateral oophorectomy). What increases the risk? This condition is more likely to start at an earlier age if you have certain medical conditions or treatments, including:  A tumor of the pituitary gland in the brain.  A disease that affects the ovaries and hormone production.  Radiation treatment for cancer.  Certain cancer treatments, such as chemotherapy or hormone (anti-estrogen) therapy.  Heavy smoking and excessive alcohol use.  Family history of early menopause. This condition is also more likely to develop earlier in women who are very thin. What are the signs or symptoms? Symptoms of this condition include:  Hot flashes.  Irregular menstrual periods.  Night sweats.  Changes in feelings about sex. This could be a decrease in sex drive or an increased comfort around your sexuality.  Vaginal dryness and thinning of the vaginal walls. This may cause painful intercourse.  Dryness of the skin and development of wrinkles.  Headaches.  Problems sleeping (insomnia).  Mood swings or irritability.  Memory problems.  Weight gain.  Hair growth on the face and chest.  Bladder infections or problems with urinating. How  is this diagnosed? This condition is diagnosed based on your medical history, a physical exam, your age, your menstrual history, and your symptoms. Hormone tests may also be done. How is this treated? In some cases, no treatment is needed. You and your health care provider should make a decision together about whether treatment is necessary. Treatment will be based on your individual condition and preferences. Treatment for this condition focuses on managing symptoms. Treatment may include:  Menopausal hormone therapy (MHT).  Medicines to treat specific symptoms or complications.  Acupuncture.  Vitamin or herbal supplements. Before starting treatment, make sure to let your health care provider know if you have a personal or family history of:  Heart disease.  Breast cancer.  Blood clots.  Diabetes.  Osteoporosis. Follow these instructions at home: Lifestyle  Do not use any products that contain nicotine or tobacco, such as cigarettes and e-cigarettes. If you need help quitting, ask your health care provider.  Get at least 30 minutes of physical activity on 5 or more days each week.  Avoid alcoholic and caffeinated beverages, as well as spicy foods. This may help prevent hot flashes.  Get 7-8 hours of sleep each night.  If you have hot flashes, try: ? Dressing in layers. ? Avoiding things that may trigger hot flashes, such as spicy food, warm places, or stress. ? Taking slow, deep breaths when a hot flash starts. ? Keeping a fan in your home and office.  Find ways to manage stress, such as deep breathing, meditation, or journaling.  Consider going to group therapy with other women who are having menopause symptoms. Ask your health care provider about recommended group therapy meetings. Eating and   drinking  Eat a healthy, balanced diet that contains whole grains, lean protein, low-fat dairy, and plenty of fruits and vegetables.  Your health care provider may recommend  adding more soy to your diet. Foods that contain soy include tofu, tempeh, and soy milk.  Eat plenty of foods that contain calcium and vitamin D for bone health. Items that are rich in calcium include low-fat milk, yogurt, beans, almonds, sardines, broccoli, and kale. Medicines  Take over-the-counter and prescription medicines only as told by your health care provider.  Talk with your health care provider before starting any herbal supplements. If prescribed, take vitamins and supplements as told by your health care provider. These may include: ? Calcium. Women age 51 and older should get 1,200 mg (milligrams) of calcium every day. ? Vitamin D. Women need 600-800 International Units of vitamin D each day. ? Vitamins B12 and B6. Aim for 50 micrograms of B12 and 1.5 mg of B6 each day. General instructions  Keep track of your menstrual periods, including: ? When they occur. ? How heavy they are and how long they last. ? How much time passes between periods.  Keep track of your symptoms, noting when they start, how often you have them, and how long they last.  Use vaginal lubricants or moisturizers to help with vaginal dryness and improve comfort during sex.  Keep all follow-up visits as told by your health care provider. This is important. This includes any group therapy or counseling. Contact a health care provider if:  You are still having menstrual periods after age 55.  You have pain during sex.  You have not had a period for 12 months and you develop vaginal bleeding. Get help right away if:  You have: ? Severe depression. ? Excessive vaginal bleeding. ? Pain when you urinate. ? A fast or irregular heart beat (palpitations). ? Severe headaches. ? Abdomen (abdominal) pain or severe indigestion.  You fell and you think you have a broken bone.  You develop leg or chest pain.  You develop vision problems.  You feel a lump in your breast. Summary  Menopause is the normal  time of life when menstrual periods stop completely. It is usually confirmed by 12 months without a menstrual period.  The transition to menopause (perimenopause) most often happens between the ages of 45 and 55.  Symptoms can be managed through medicines, lifestyle changes, and complementary therapies such as acupuncture.  Eat a balanced diet that is rich in nutrients to promote bone health and heart health and to manage symptoms during menopause. This information is not intended to replace advice given to you by your health care provider. Make sure you discuss any questions you have with your health care provider. Document Released: 03/12/2003 Document Revised: 12/02/2016 Document Reviewed: 01/23/2016 Elsevier Patient Education  2020 Elsevier Inc.  

## 2018-08-10 NOTE — Assessment & Plan Note (Signed)
Would benefit from STD screening regularly.  Will obtain at in office visit.

## 2018-08-10 NOTE — Assessment & Plan Note (Signed)
Suspect exacerbated by substance abuse.  Have recommended complete cessation of cocaine use due to major risk for MI or stroke.  Restart Amlodipine and follow-up in 4 weeks in office for BP check and labs.

## 2018-08-10 NOTE — Assessment & Plan Note (Signed)
Check A1C in 4 weeks at in office visit and monitor.

## 2018-08-10 NOTE — Assessment & Plan Note (Signed)
New with symptoms since this year.  Discussed simple remedies for hot flashes, such as wearing light clothing, cool clothes, air conditioning, and avoiding specific foods and caffeine.  Plan on pap in office in 4 weeks to catch up on preventative needs.  Consider addition of Citalopram in upcoming months, which may also benefit mood.

## 2018-08-10 NOTE — Assessment & Plan Note (Signed)
Chronic, ongoing.  Will restart Trazodone.  Script sent.  Have discussed with patient at length that if more intense therapy is required will place psychiatry referral, recommended she start back at [facility] and also obtain therapy at this time due to her current circumstances.  Return in 4 weeks for in office visit.

## 2018-08-17 ENCOUNTER — Ambulatory Visit: Payer: Self-pay | Admitting: Licensed Clinical Social Worker

## 2018-08-17 ENCOUNTER — Telehealth: Payer: Self-pay

## 2018-08-17 NOTE — Chronic Care Management (AMB) (Signed)
  Care Management   Follow Up Note   08/17/2018 Name: KHYLAH KENDRA MRN: [id number] DOB: 11-01-1970  Referred by: [name], NP Reason for referral : Alfalfa is a 48 y.o. year old female who is a primary care patient of Cannady, Barbaraann Faster, NP. The care management team was consulted for assistance with care management and care coordination needs.    Review of patient status, including review of consultants reports, relevant laboratory and other test results, and collaboration with appropriate care team members and the patient's provider was performed as part of comprehensive patient evaluation and provision of chronic care management services.    LCSW completed CCM outreach attempt today on 08/17/2018 but was unable to reach patient successfully or leave a voice message. Phone call could not be completed because number was not working or in service. LCSW will update CCM team and PCP.   The care management team will reach out to the patient again over the next 30 days.   [name], BSW, MSW, [facility] Care Management [email or web address] Phone: [phone]

## 2018-08-17 NOTE — Chronic Care Management (AMB) (Signed)
  Care Management   Follow Up Note   08/17/2018 Name: Paula Mullins MRN: [id number] DOB: 1970-10-22  Referred by: [name], NP Reason for referral : No chief complaint on file.   Paula Mullins is a 48 y.o. year old female who is a primary care patient of Cannady, Barbaraann Faster, NP. The care management team was consulted for assistance with care management and care coordination needs.    Review of patient status, including review of consultants reports, relevant laboratory and other test results, and collaboration with appropriate care team members and the patient's provider was performed as part of comprehensive patient evaluation and provision of chronic care management services.    LCSW completed CCM outreach attempt today but was unable to reach patient successfully. A HIPPA compliant voice message was left encouraging patient to return call once available. LCSW rescheduled CCM SW appointment as well.  A HIPPA compliant phone message was left for the patient providing contact information and requesting a return call.   [name], BSW, MSW, [facility] Care Management [email or web address] Phone: [phone]

## 2018-09-28 ENCOUNTER — Telehealth: Payer: Self-pay | Admitting: Nurse Practitioner

## 2018-09-28 ENCOUNTER — Telehealth: Payer: Self-pay

## 2018-09-28 NOTE — Chronic Care Management (AMB) (Signed)
Made in error

## 2018-10-02 ENCOUNTER — Ambulatory Visit: Payer: Self-pay | Admitting: Pharmacist

## 2018-10-02 DIAGNOSIS — F39 Unspecified mood [affective] disorder: Secondary | ICD-10-CM

## 2018-10-02 DIAGNOSIS — F1410 Cocaine abuse, uncomplicated: Secondary | ICD-10-CM

## 2018-10-02 DIAGNOSIS — I10 Essential (primary) hypertension: Secondary | ICD-10-CM

## 2018-10-02 NOTE — Patient Instructions (Signed)
Visit Information  Goals Addressed            This Visit's Progress     Patient Stated   . "I can't afford my medications" (pt-stated)       Current Barriers:  . Polypharmacy; complex patient with multiple comorbidities including substance abuse, HTN, bipolar 1 disorder . Referred from Marnee Guarneri, NP for chronic support and pursuance of rehab for cocaine use; CCM LCSW has been unable to get in contact with patient . Today, patient reports that she believes her rehab intake date is 10/26/2018, unsure though. She is amenable to a call from social work . Notes that she does not have money for her medications at this time, even on [employer] Drug List. 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 Comprehensive medication review performed; medication list updated in electronic medical record . Outreach scheduled with LCSW tomorrow morning; informed patient of this, she confirmed that she would be available on listed mobile number ([phone]) which is what I called today.  Ronne Binning pharmacist at [facility] to see if they can provide help to patient w/ [facility], as this does not have prescription coverage.   Patient Self Care Activities:  . Patient will communicate with healthcare team  Initial goal documentation        Ms. Kleeman was given information about Chronic Care Management services today including:  1. CCM service includes personalized support from designated clinical staff supervised by her physician, including individualized plan of care and coordination with other care providers 2. 24/7 contact phone numbers for assistance for urgent and routine care needs. 3. Service will only be billed when office clinical staff spend 20 minutes or more in a month  to coordinate care. 4. Only one practitioner may furnish and bill the service in a calendar month. 5. The patient may stop CCM services at any time (effective at the end of the month) by phone call to the office staff. 6. The patient will be responsible for cost sharing (co-pay) of up to 20% of the service fee (after annual deductible is met).  Patient agreed to services and verbal consent obtained.   The patient verbalized understanding of instructions provided today and declined a print copy of patient instruction materials.   Plan: - Will continue to collaborate with CCM team to support patient  Catie Darnelle Maffucci, PharmD Clinical Pharmacist [place] [phone]

## 2018-10-02 NOTE — Chronic Care Management (AMB) (Signed)
Chronic Care Management   Note  10/02/2018 Name: Paula Mullins MRN: [id number] DOB: 06-29-1970   Subjective:  Paula Mullins is a 48 y.o. year old female who is a primary care patient of Cannady, Barbaraann Faster, NP. The CCM team was consulted for assistance with chronic disease management and care coordination needs.     Paula Mullins was given information about Chronic Care Management services today including:  1. CCM service includes personalized support from designated clinical staff supervised by her physician, including individualized plan of care and coordination with other care providers 2. 24/7 contact phone numbers for assistance for urgent and routine care needs. 3. Service will only be billed when office clinical staff spend 20 minutes or more in a month to coordinate care. 4. Only one practitioner may furnish and bill the service in a calendar month. 5. The patient may stop CCM services at any time (effective at the end of the month) by phone call to the office staff. 6. The patient will be responsible for cost sharing (co-pay) of up to 20% of the service fee (after annual deductible is met).  Patient agreed to services and verbal consent obtained.   Review of patient status, including review of consultants reports, laboratory and other test data, was performed as part of comprehensive evaluation and provision of chronic care management services.   Objective:  Lab Results  Component Value Date   CREATININE 0.97 02/26/2015   CREATININE 1.01 (H) 10/15/2014   CREATININE 1.0 07/09/2013    Lab Results  Component Value Date   HGBA1C 6.2 10/15/2014       Component Value Date/Time   CHOL 190 10/15/2014 1029   TRIG 124 10/15/2014 1029   HDL 90 10/15/2014 1029   LDLCALC 75 10/15/2014 1029    Clinical ASCVD: No   BP Readings from Last 3 Encounters:  02/03/17 (!) 150/88  11/23/16 (!) 142/94  11/13/16 124/78    Allergies  Allergen Reactions  . Flagyl [Metronidazole]  Swelling    Medications Reviewed Today    Reviewed by De Hollingshead, [facility] (Pharmacist) on 10/02/18 at 1609  Med List Status: <None>  Medication Order Taking? Sig Documenting Provider Last Dose Status Informant  amLODipine (NORVASC) 5 MG tablet [id number] No Take 1 tablet (5 mg total) by mouth daily.  Patient not taking: Reported on 10/02/2018   Marnee Guarneri T, NP Not Taking Active   traZODone (DESYREL) 50 MG tablet [id number] No Take 0.5-1 tablets (25-50 mg total) by mouth at bedtime as needed for sleep.  Patient not taking: Reported on 10/02/2018   [name], NP Not Taking Active            Assessment:   Goals Addressed            This Visit's Progress     Patient Stated   . "I can't afford my medications" (pt-stated)       Current Barriers:  . Polypharmacy; complex patient with multiple comorbidities including substance abuse, HTN, bipolar 1 disorder . Referred from Marnee Guarneri, NP for chronic support and pursuance of rehab for cocaine use; CCM LCSW has been unable to get in contact with patient . Today, patient reports that she believes her rehab intake date is 10/26/2018, unsure though. She is amenable to a call from social work . Notes that she does not have money for her medications at this time, even on [employer] Drug List. Patient is showing as Medicaid Family Planning as her insurance  Pharmacist Clinical Goal(s):  Marland Kitchen Over the next 30 days, patient will work with PharmD and provider towards optimized medication management  Interventions: . Comprehensive medication review performed; medication list updated in electronic medical record . Outreach scheduled with LCSW tomorrow morning; informed patient of this, she confirmed that she would be available on listed mobile number ([phone]) which is what I called today.  Ronne Binning pharmacist at [facility] to see if they can provide help to patient w/ [facility], as  this does not have prescription coverage.   Patient Self Care Activities:  . Patient will communicate with healthcare team  Initial goal documentation        Plan: - Will continue to collaborate with CCM team to support patient  Catie Darnelle Maffucci, PharmD Clinical Pharmacist [place] [phone]

## 2018-10-03 ENCOUNTER — Ambulatory Visit: Payer: Self-pay | Admitting: Licensed Clinical Social Worker

## 2018-10-03 DIAGNOSIS — F1410 Cocaine abuse, uncomplicated: Secondary | ICD-10-CM

## 2018-10-03 DIAGNOSIS — F39 Unspecified mood [affective] disorder: Secondary | ICD-10-CM

## 2018-10-03 NOTE — Chronic Care Management (AMB) (Signed)
  Care Management   Follow Up Note   10/03/2018 Name: Paula Mullins MRN: [id number] DOB: October 03, 1970  Referred by: [name], NP Reason for referral : [place] is a 48 y.o. year old female who is a primary care patient of Cannady, Barbaraann Faster, NP. The care management team was consulted for assistance with care management and care coordination needs.    Review of patient status, including review of consultants reports, relevant laboratory and other test results, and collaboration with appropriate care team members and the patient's provider was performed as part of comprehensive patient evaluation and provision of chronic care management services.    Goals Addressed    . "I need financial support right now." (pt-stated)       Current Barriers:  . Financial constraints related to managing healthcare . Limited social support . Limited access to food . ADL IADL limitations . Mental Health Concerns  . Substance abuse issues - ongoing cocaine abuse  Clinical Social Work Clinical Goal(s):  [name], [name]   Interventions: . Patient interviewed and appropriate assessments performed . Provided patient with information about that patient will need to gain disability to get Full Adult Medicaid. Patient only has Family Planning Medicaid at this time which covers little to nothing. LCSW provided extensive education on financial assistance resources available within the area . Discussed plans with patient for ongoing care management follow up and provided patient with direct contact information for care management team . Advised patient to check her email for resources that were sent out by LCSW on 10/03/2018. Marland Kitchen Assisted patient/caregiver with  obtaining information about health plan benefits . Patient reports that she is currently residing with a friend in [place]. Patient confirms stable transportation to medical appointments at this time (states she has a few friends that have agreed to assist her with this) [employer] education provided as well.  . CSW used motivational interviewing techniques to engage pt in meaningful conversation and to assess pt's wilingess to abstain from cocaine. Patient uses $60 per week. CSW explored pt's coping skills and access to community services. Patient denies wanting substance abuse resource education at this time.   Patient Self Care Activities:  . Attends all scheduled provider appointments . Calls provider office for new concerns or questions . Unable to independently afford health care expenses   Initial goal documentation     The care management team will reach out to the patient again over the next quarter days.   [name], BSW, MSW, [facility] [email or web address] Phone: [phone]

## 2018-10-05 ENCOUNTER — Telehealth: Payer: Self-pay

## 2018-10-05 ENCOUNTER — Encounter: Payer: Medicaid Other | Admitting: Nurse Practitioner

## 2018-10-11 ENCOUNTER — Telehealth: Payer: Self-pay | Admitting: Pharmacy Technician

## 2018-10-11 NOTE — Telephone Encounter (Signed)
Provided patient with new patient packet to obtain [facility] services.  [facility] must receive current financial documentation in order to determine eligibility and provide medication assistance.  Shawndell Varas J. Lyndol Vanderheiden Care Manager [facility] 

## 2018-10-26 ENCOUNTER — Telehealth: Payer: Self-pay | Admitting: *Deleted

## 2018-10-26 ENCOUNTER — Telehealth: Payer: Self-pay

## 2018-11-05 ENCOUNTER — Ambulatory Visit: Payer: Self-pay | Admitting: Licensed Clinical Social Worker

## 2018-11-05 DIAGNOSIS — F1410 Cocaine abuse, uncomplicated: Secondary | ICD-10-CM

## 2018-11-05 NOTE — Chronic Care Management (AMB) (Signed)
Care Management   Follow Up Note   11/05/2018 Name: Paula Mullins MRN: [id number] DOB: 1970/03/06  Referred by: [name], NP Reason for referral : Paula Mullins is a 48 y.o. year old female who is a primary care patient of Cannady, Barbaraann Faster, NP. The care management team was consulted for assistance with care management and care coordination needs.    Review of patient status, including review of consultants reports, relevant laboratory and other test results, and collaboration with appropriate care team members and the patient's provider was performed as part of comprehensive patient evaluation and provision of chronic care management services.    SDOH (Social Determinants of Health) screening performed today: Financial Strain  Alcohol/Substance Use. See Care Plan for related entries.   Advanced Directives: See Care Plan and Vyn[employer] application for related entries.   Goals Addressed    . SW-"I need financial support right now." (pt-stated)       Current Barriers:  . Financial constraints related to managing healthcare . Limited social support . Limited access to food . ADL IADL limitations . Mental Health Concerns  . Substance abuse issues - ongoing cocaine abuse  Clinical Social Work Clinical Goal(s):  [name], [name]   Interventions: . Patient interviewed and appropriate assessments performed . Provided patient with information about that patient will need to gain disability to get Full Adult Medicaid. Patient only has Family Planning Medicaid at this time which covers little to nothing. LCSW provided extensive education on financial assistance resources available within the area . Discussed plans with patient for ongoing  care management follow up and provided patient with direct contact information for care management team . Advised patient to check her email for resources that were sent out by LCSW on 10/03/2018 and again on 11/05/2018. LCSW also sent a text message to patient with these resources attached but patient admits to ongoing issues with her phone/connection/affordability. . Assisted patient/caregiver with obtaining information about health plan benefits . Patient reports that she is currently residing with a friend in [place]. Patient confirms stable transportation to medical appointments at this time (states she has a few friends that have agreed to assist her with this) [employer] education provided as well.  . CSW used motivational interviewing techniques to engage pt in meaningful conversation and to assess pt's wilingess to abstain from cocaine. Patient uses $60 per week. CSW explored pt's coping skills and access to community services. Patient denies wanting substance abuse resource education at this time.  . Patient reports that she is in between jobs right now but has been able to do a side job this past week which has improved her motivation, depression and purpose.   Patient Self Care Activities:  . Attends all scheduled provider appointments . Calls provider office for new concerns or questions . Unable to independently afford health care expenses   Please see past updates related to this goal by clicking on the "Past Updates" button in the selected goal      The care management team will reach out to the patient again over the next 45 days.    [name], BSW, MSW, [facility] [email or web address] Phone: [phone]

## 2018-11-09 ENCOUNTER — Telehealth: Payer: Self-pay

## 2018-12-12 ENCOUNTER — Ambulatory Visit: Payer: Self-pay | Admitting: *Deleted

## 2018-12-12 ENCOUNTER — Telehealth: Payer: Self-pay

## 2018-12-12 NOTE — Chronic Care Management (AMB) (Signed)
  Chronic Care Management   Outreach Note  12/12/2018 Name: Paula Mullins MRN: [id number] DOB: 08-19-1970  Referred by: [name], NP Reason for referral : Chronic Care Management (Unsuccessful Outreach )   An unsuccessful telephone outreach was attempted today. The patient was referred to the case management team by for assistance with care management and care coordination. Used PCP number via [employer] and called patient's provided new number.   Follow Up Plan: A HIPPA compliant phone message was left for the patient providing contact information and requesting a return call.  The care management team will reach out to the patient again over the next 60 days.   Merlene Morse Keean Wilmeth RN, BSN Nurse Case Editor, [facility]  [phone]) Business Mobile

## 2018-12-31 ENCOUNTER — Ambulatory Visit: Payer: Self-pay | Admitting: *Deleted

## 2018-12-31 NOTE — Telephone Encounter (Signed)
Pt called with complaints of vaginal bleeding desrcibed as "clotting" which started 4 days ago; she currently saturating pads, and has been using hand towels and wash cloths; she is also having right abdominal pain that radiates to there right side, and a migraine; she states that her period had been on for 1.5 months, and stopped for a week; the bleeding resumed on 12/27/2018; the pt also says she is having leg pain, and weakness, and prefers to lay down; recommendations made per nurse triage protocol; she verbalized understanding; the pt sees [employer], [facility]; will route to office for notification.   Reason for Disposition . SEVERE abdominal pain  Answer Assessment - Initial Assessment Questions 1. AMOUNT: "Describe the bleeding that you are having."    - SPOTTING: spotting, or pinkish / brownish mucous discharge; does not fill panti-liner or pad    - MILD:  less than 1 pad / hour; less than patient's usual menstrual bleeding   - MODERATE: 1-2 pads / hour; 1 menstrual cup every 6 hours; small-medium blood clots (e.g., pea, grape, small coin)   - SEVERE: soaking 2 or more pads/hour for 2 or more hours; 1 menstrual cup every 2 hours; bleeding not contained by pads or continuous red blood from vagina; large blood clots (e.g., golf ball, large coin)      severe 2. ONSET: "When did the bleeding begin?" "Is it continuing now?"    12/27/2018 yes 3. MENSTRUAL PERIOD: "When was the last normal menstrual period?" "How is this different than your period?"     7 days cyle for 21 days; last normal period "a long time" ago 4. REGULARITY: "How regular are your periods?"      5. ABDOMINAL PAIN: "Do you have any pain?" "How bad is the pain?"  (e.g., Scale 1-10; mild, moderate, or severe)   - MILD (1-3): doesn't interfere with normal activities, abdomen soft and not tender to touch    - MODERATE (4-7): interferes with normal activities or awakens from sleep, tender to touch    - SEVERE (8-10):  excruciating pain, doubled over, unable to do any normal activities      6 out of 10 6. PREGNANCY: "Could you be pregnant?" "Are you sexually active?" "Did you recently give birth?"     No BTL 7. BREASTFEEDING: "Are you breastfeeding?"     no 8. HORMONES: "Are you taking any hormone medications, prescription or OTC?" (e.g., birth control pills, estrogen)   no 9. BLOOD THINNERS: "Do you take any blood thinners?" (e.g., Coumadin/warfarin, Pradaxa/dabigatran, aspirin)   no 10. CAUSE: "What do you think is causing the bleeding?" (e.g., recent gyn surgery, recent gyn procedure; known bleeding disorder, cervical cancer, polycystic ovarian disease, fibroids)        No idea; ? Hormones due to menopause 11. HEMODYNAMIC STATUS: "Are you weak or feeling lightheaded?" If so, ask: "Can you stand and walk normally?"       weak 12. OTHER SYMPTOMS: "What other symptoms are you having with the bleeding?" (e.g., passed tissue, vaginal discharge, fever, menstrual-type cramps)       Leg pain, migraines  Protocols used: VAGINAL BLEEDING - ABNORMAL-A-AH

## 2018-12-31 NOTE — Telephone Encounter (Signed)
Agree with plan for ER visit for imaging and further face to face assessment due to heavy bleeding and other symptoms present with this.

## 2019-01-01 ENCOUNTER — Other Ambulatory Visit: Payer: Self-pay

## 2019-01-01 ENCOUNTER — Emergency Department (HOSPITAL_COMMUNITY): Payer: Self-pay

## 2019-01-01 ENCOUNTER — Emergency Department (HOSPITAL_COMMUNITY)
Admission: EM | Admit: 2019-01-01 | Discharge: 2019-01-01 | Disposition: A | Discharge status: Home / Self Care | Payer: Self-pay | Attending: Emergency Medicine | Admitting: Emergency Medicine

## 2019-01-01 ENCOUNTER — Encounter (HOSPITAL_COMMUNITY): Payer: Self-pay | Admitting: Emergency Medicine

## 2019-01-01 DIAGNOSIS — R1031 Right lower quadrant pain: Secondary | ICD-10-CM | POA: Insufficient documentation

## 2019-01-01 DIAGNOSIS — N939 Abnormal uterine and vaginal bleeding, unspecified: Secondary | ICD-10-CM | POA: Insufficient documentation

## 2019-01-01 DIAGNOSIS — R030 Elevated blood-pressure reading, without diagnosis of hypertension: Secondary | ICD-10-CM

## 2019-01-01 DIAGNOSIS — I10 Essential (primary) hypertension: Secondary | ICD-10-CM | POA: Insufficient documentation

## 2019-01-01 LAB — CBC WITH DIFFERENTIAL/PLATELET
Abs Immature Granulocytes: 0.01 10*3/uL (ref 0.00–0.07)
Basophils Absolute: 0 10*3/uL (ref 0.0–0.1)
Basophils Relative: 1 %
Eosinophils Absolute: 0.1 10*3/uL (ref 0.0–0.5)
Eosinophils Relative: 2 %
HCT: 33.3 % — ABNORMAL LOW (ref 36.0–46.0)
Hemoglobin: 10.8 g/dL — ABNORMAL LOW (ref 12.0–15.0)
Immature Granulocytes: 0 %
Lymphocytes Relative: 31 %
Lymphs Abs: 1.2 10*3/uL (ref 0.7–4.0)
MCH: 30.4 pg (ref 26.0–34.0)
MCHC: 32.4 g/dL (ref 30.0–36.0)
MCV: 93.8 fL (ref 80.0–100.0)
Monocytes Absolute: 0.3 10*3/uL (ref 0.1–1.0)
Monocytes Relative: 8 %
Neutro Abs: 2.4 10*3/uL (ref 1.7–7.7)
Neutrophils Relative %: 58 %
Platelets: 248 10*3/uL (ref 150–400)
RBC: 3.55 MIL/uL — ABNORMAL LOW (ref 3.87–5.11)
RDW: 13.2 % (ref 11.5–15.5)
WBC: 4 10*3/uL (ref 4.0–10.5)
nRBC: 0 % (ref 0.0–0.2)

## 2019-01-01 LAB — BASIC METABOLIC PANEL
Anion gap: 8 (ref 5–15)
BUN: 15 mg/dL (ref 6–20)
CO2: 25 mmol/L (ref 22–32)
Calcium: 8.9 mg/dL (ref 8.9–10.3)
Chloride: 106 mmol/L (ref 98–111)
Creatinine, Ser: 1.2 mg/dL — ABNORMAL HIGH (ref 0.44–1.00)
GFR calc Af Amer: >60 mL/min (ref >60)
GFR calc non Af Amer: 53 mL/min — ABNORMAL LOW (ref >60)
Glucose, Bld: 147 mg/dL — ABNORMAL HIGH (ref 70–99)
Potassium: 3.6 mmol/L (ref 3.5–5.1)
Sodium: 139 mmol/L (ref 135–145)

## 2019-01-01 LAB — WET PREP, GENITAL
Clue Cells Wet Prep HPF POC: NONE SEEN
Sperm: NONE SEEN
Trich, Wet Prep: NONE SEEN
WBC, Wet Prep HPF POC: NONE SEEN
Yeast Wet Prep HPF POC: NONE SEEN

## 2019-01-01 LAB — POC URINE PREG, ED: Preg Test, Ur: NEGATIVE

## 2019-01-01 NOTE — ED Triage Notes (Signed)
Pt reports clotting x5 days. Pt reports headache, back pain, muscle cramps, insomnia. Pt reports irregular periods in the past. Pt reports RLQ abdominal pain. Pt reports bleeding has slowed today and reports hasn't had to change her pad as frequently. Pt reports fatigue at this time. Pt denies n/v/dizziness.

## 2019-01-01 NOTE — Discharge Instructions (Signed)
Follow up with OBGYN.

## 2019-01-01 NOTE — ED Provider Notes (Signed)
[facility] EMERGENCY DEPARTMENT Provider Note   CSN: [id number] Arrival date & time: 01/01/19  1648    History Chief Complaint  Patient presents with  . Vaginal Bleeding    Paula Mullins is a 48 y.o. female with past medical history significant for high risk sexual behavior, bipolar, cocaine abuse, hypertension, migraine headaches who presents for evaluation of vaginal bleeding.  Patient states she has had intermittent vaginal bleeding x months.  Patient states over the last 5 days she was having heavy vaginal bleeding.  Patient states today she is only had to change her pad once.  Does admit to some dime size clots.  States she has had some generalized lower abdominal pain however is had right lower quadrant abdominal pain x3 days.  She is sexually active and does use protection.  She is requesting STD testing.  States she did have a migraine headache a couple days ago however does not have 1 currently.  Denies sudden onset thunderclap headache, lateral weakness, facial droop.  Patient states she does have some back pain however this is chronic in nature.  Denies dizziness, nausea, vomiting, chest pain, shortness of breath, diarrhea, dysuria, vaginal discharge, pelvic pain. Mild lightheadedness. Has not take anything for symptoms.  Denies additional aggravating or alleviating factors.  Rates her current pain a 3/10 to right lower quadrant.  History obtained from patient and past medical records.  No interpreter is used.  HPI     Past Medical History:  Diagnosis Date  . Anxiety    Pt reports anxiety  . Migraine   . Sleep apnea     Patient Active Problem List   Diagnosis Date Noted  . Essential hypertension 08/10/2018  . Menopause 08/10/2018  . Cocaine abuse ([place]) 07/26/2016  . Bipolar I disorder ([place]) 07/26/2016  . High risk sexual behavior 10/20/2014  . IFG (impaired fasting glucose) 10/15/2014  . Anemia 10/15/2014  . Migraine 10/15/2014    Past Surgical History:    Procedure Laterality Date  . CESAREAN SECTION  2011  . HERNIA REPAIR  2012  . TUBAL LIGATION       OB History   No obstetric history on file.     Family History  Problem Relation Age of Onset  . Cancer Sister        Ovarian  . Cancer Maternal Grandmother        Leukemia  . Asthma Mother   . Allergies Mother     Social History   Tobacco Use  . Smoking status: Never Smoker  . Smokeless tobacco: Never Used  Substance Use Topics  . Alcohol use: Yes    Comment: occasionally  . Drug use: Yes    Frequency: 1.0 times per week    Types: Cocaine    Comment: patient has used within the last week    Home Medications Prior to Admission medications   Medication Sig Start Date End Date Taking? Authorizing Provider  amLODipine (NORVASC) 5 MG tablet Take 1 tablet (5 mg total) by mouth daily. Patient not taking: Reported on 10/02/2018 08/10/18   Marnee Guarneri T, NP  traZODone (DESYREL) 50 MG tablet Take 0.5-1 tablets (25-50 mg total) by mouth at bedtime as needed for sleep. Patient not taking: Reported on 10/02/2018 08/10/18   [name], NP    Allergies    Flagyl [metronidazole]  Review of Systems   Review of Systems  Constitutional: Negative.   HENT: Negative.   Respiratory: Negative.   Cardiovascular: Negative.  Gastrointestinal: Positive for abdominal pain (RLQ). Negative for anal bleeding, blood in stool, constipation, diarrhea, nausea, rectal pain and vomiting.  Musculoskeletal: Positive for back pain (Chronic, unchanged).  Neurological: Positive for headaches (2 days ago).  All other systems reviewed and are negative.   Physical Exam Updated Vital Signs BP (!) 145/71   Pulse 75   Temp 98.2 F (36.8 C) (Oral)   Resp 18   Ht 5\' 5"  (1.651 m)   Wt 63.5 kg   LMP 12/28/2018   SpO2 99%   BMI 23.30 kg/m   Physical Exam Vitals and nursing note reviewed.  Constitutional:      General: She is not in acute distress.    Appearance: She is well-developed. She  is not ill-appearing, toxic-appearing or diaphoretic.  HENT:     Head: Normocephalic and atraumatic.     Nose: Nose normal.     Mouth/Throat:     Mouth: Mucous membranes are moist.     Pharynx: Oropharynx is clear.  Eyes:     Pupils: Pupils are equal, round, and reactive to light.     Comments: No horizontal, vertical or rotational nystagmus   Neck:     Comments: Full active and passive ROM without pain No midline or paraspinal tenderness No nuchal rigidity or meningeal signs  Cardiovascular:     Rate and Rhythm: Normal rate and regular rhythm.     Pulses: Normal pulses.     Heart sounds: Normal heart sounds.  Pulmonary:     Effort: Pulmonary effort is normal. No respiratory distress.     Breath sounds: Normal breath sounds.  Abdominal:     General: Bowel sounds are normal. There is no distension.     Palpations: Abdomen is soft.     Tenderness: There is abdominal tenderness in the right lower quadrant.     Comments: Tenderness to right lower quadrant however no psoas, obturator sign.  Genitourinary:    Comments: Normal appearing external female genitalia without rashes or lesions, normal vaginal epithelium, . Normal appearing cervix without discharge or petechiae. Cervical os is closed. There is mild bleeding noted at the os. No Odor. Bimanual: No CMT,  nontender.  No palpable adnexal masses or tenderness. Uterus midline and not fixed. Rectovaginal exam was deferred.  No cystocele or rectocele noted. No pelvic lymphadenopathy noted. Wet prep was obtained.  Cultures for gonorrhea and chlamydia collected. Exam performed with chaperone in room. Musculoskeletal:        General: Normal range of motion.     Cervical back: Normal range of motion and neck supple.  Skin:    General: Skin is warm and dry.  Neurological:     Mental Status: She is alert.     Comments: Mental Status:  Alert, oriented, thought content appropriate. Speech fluent without evidence of aphasia. Able to follow 2  step commands without difficulty.  Cranial Nerves:  II:  Peripheral visual fields grossly normal, pupils equal, round, reactive to light III,IV, VI: ptosis not present, extra-ocular motions intact bilaterally  V,VII: smile symmetric, facial light touch sensation equal VIII: hearing grossly normal bilaterally  IX,X: midline uvula rise  XI: bilateral shoulder shrug equal and strong XII: midline tongue extension  Motor:  5/5 in upper and lower extremities bilaterally including strong and equal grip strength and dorsiflexion/plantar flexion Sensory: Pinprick and light touch normal in all extremities.  Deep Tendon Reflexes: 2+ and symmetric  Cerebellar: normal finger-to-nose with bilateral upper extremities Gait: normal gait and balance CV: distal  pulses palpable throughout      ED Results / Procedures / Treatments   Labs (all labs ordered are listed, but only abnormal results are displayed) Labs Reviewed  CBC WITH DIFFERENTIAL/PLATELET - Abnormal; Notable for the following components:      Result Value   RBC 3.55 (*)    Hemoglobin 10.8 (*)    HCT 33.3 (*)    All other components within normal limits  BASIC METABOLIC PANEL - Abnormal; Notable for the following components:   Glucose, Bld 147 (*)    Creatinine, Ser 1.20 (*)    GFR calc non Af Amer 53 (*)    All other components within normal limits  WET PREP, GENITAL  RPR  URINALYSIS, ROUTINE W REFLEX MICROSCOPIC  HIV ANTIBODY (ROUTINE TESTING W REFLEX)  POC URINE PREG, ED  GC/CHLAMYDIA PROBE AMP ([place]) [facility]    EKG None  Radiology No results found.  Procedures Procedures (including critical care time)  Medications Ordered in ED Medications - No data to display  ED Course  I have reviewed the triage vital signs and the nursing notes.  Pertinent labs & imaging results that were available during my care of the patient were reviewed by me and considered in my medical decision making (see chart for  details).   48 year old presents for evaluation of vaginal bleeding and abd pain.  Patient with irregular menstrual cycles.  GU exam with some mild blood in vaginal vault however no CMT or adnexal tenderness.  She does have some focal right lower quadrant tenderness however negative psoas, obturator sign.  She did have a migraine headache a few days ago however none currently.  She has a nonfocal neuro exam without deficits. Low suspicion for CVA, SAH, bleed at this time. Mild generalized fatigue and lightheadedness.  Plan on labs, CT imaging and reevaluate.  She does not want anything for pain at this time. She is followed by Obgyn. Was told to come here to imaging to r/o other cause of her RLQ pain per patient.  Labs and imaging personally reviewed: CBC without leukocytosis, hemoglobin 10.8, prior 3 years ago 11.7, stable. Wet prep negative Pregnancy negative Urinalysis pending  CT AP pending  CG/Chlamydia pending HIV, RPR pending  Patient reassessed stating she has to leave.  Discussed with patient imaging as well as labs pending.  She does not want to stay for the results of her labs or any imaging.  Discussed risk versus benefit with patient.  Patient voiced understanding and states she will follow-up outpatient with her OB/GYN.  We discussed the nature and purpose, risks and benefits, as well as, the alternatives of treatment. Time was given to allow the opportunity to ask questions and consider their options, and after the discussion, the patient decided to refuse the offerred treatment. The patient was informed that refusal could lead to, but was not limited to, death, permanent disability, or severe pain. If present, I asked the relatives or significant others to dissuade them without success. Prior to refusing, I determined that the patient had the capacity to make their decision and understood the consequences of that decision. After refusal, I made every reasonable opportunity to treat  them to the best of my ability.  The patient was notified that they may return to the emergency department at any time for further treatment.       MDM Rules/Calculators/A&P  Final Clinical Impression(s) / ED Diagnoses Final diagnoses:  Abnormal vaginal bleeding  RLQ abdominal pain  Elevated blood pressure reading    Rx / DC Orders ED Discharge Orders    None       Deisi Salonga A, PA-C 01/01/19 2141    Eber Hong, MD 01/02/19 1014

## 2019-01-02 ENCOUNTER — Ambulatory Visit: Payer: Self-pay | Admitting: Licensed Clinical Social Worker

## 2019-01-02 ENCOUNTER — Telehealth: Payer: Self-pay

## 2019-01-02 ENCOUNTER — Encounter: Payer: Self-pay | Admitting: Nurse Practitioner

## 2019-01-02 LAB — RPR: RPR Ser Ql: NONREACTIVE

## 2019-01-02 LAB — HIV ANTIBODY (ROUTINE TESTING W REFLEX): HIV Screen 4th Generation wRfx: NONREACTIVE

## 2019-01-02 NOTE — Chronic Care Management (AMB) (Signed)
  Care Management   Follow Up Note   01/02/2019 Name: Paula Mullins MRN: [id number] DOB: [birth year]  Referred by: [name], NP Reason for referral : Dighton is a 48 y.o. year old female who is a primary care patient of Cannady, Barbaraann Faster, NP. The care management team was consulted for assistance with care management and care coordination needs.    Review of patient status, including review of consultants reports, relevant laboratory and other test results, and collaboration with appropriate care team members and the patient's provider was performed as part of comprehensive patient evaluation and provision of chronic care management services.    LCSW completed CCM outreach attempt today but was unable to reach patient successfully. A HIPPA compliant voice message was unable to be left as both of patient's contact numbers were no longer in service. LCSW rescheduled CCM SW appointment as well.  The care management team will reach out to the patient again over the next quarter.  [name], BSW, MSW, [facility] Care Management [email or web address] Phone: [phone]

## 2019-01-03 LAB — GC/CHLAMYDIA PROBE AMP (~~LOC~~) NOT AT ARMC
Chlamydia: NEGATIVE
Neisseria Gonorrhea: NEGATIVE

## 2019-01-30 ENCOUNTER — Telehealth: Payer: Self-pay

## 2019-02-01 ENCOUNTER — Encounter: Payer: Self-pay | Admitting: Nurse Practitioner

## 2019-02-04 ENCOUNTER — Telehealth: Payer: Self-pay

## 2019-02-04 ENCOUNTER — Telehealth: Payer: Self-pay | Admitting: Nurse Practitioner

## 2019-02-04 NOTE — Telephone Encounter (Signed)
lvm X3 on 01/02/2019 ,sent letter lvm on x2 01/07/19 lvm on 02/01/2019 sent letter

## 2019-02-04 NOTE — Telephone Encounter (Signed)
-----   Message from Marjie Skiff, NP sent at 01/02/2019  9:56 AM EST ----- Needs ER follow-up next week in office for anemia and vaginal bleeding.  She left ER before labs reported to her.

## 2019-03-08 ENCOUNTER — Ambulatory Visit: Payer: Self-pay | Admitting: General Practice

## 2019-03-08 ENCOUNTER — Telehealth: Payer: Self-pay

## 2019-03-08 NOTE — Chronic Care Management (AMB) (Signed)
°  Chronic Care Management   Outreach Note  03/08/2019 Name: Paula Mullins MRN: [id number] DOB: Jan 18, 1970  Referred by: Marjie Skiff, NP Reason for referral : Care Coordination (Initial Call attempt 2)   A second unsuccessful telephone outreach was attempted today. The patient was referred to the case management team for assistance with care management and care coordination.   Follow Up Plan: The care management team will reach out to the patient again over the next 30 to 60 days.    Alto Denver RN, MSN, CCM Community Care Coordinator    [facility] Mobile: [phone]

## 2019-04-29 ENCOUNTER — Ambulatory Visit: Payer: Self-pay | Admitting: Licensed Clinical Social Worker

## 2019-04-29 NOTE — Chronic Care Management (AMB) (Signed)
  Care Management   Follow Up Note   04/29/2019 Name: Paula Mullins MRN: [id number] DOB: January 17, 1970  Referred by: Marjie Skiff, NP Reason for referral : Care Coordination   Paula Mullins is a 49 y.o. year old female who is a primary care patient of Cannady, Dorie Rank, NP. The care management team was consulted for assistance with care management and care coordination needs.    Review of patient status, including review of consultants reports, relevant laboratory and other test results, and collaboration with appropriate care team members and the patient's provider was performed as part of comprehensive patient evaluation and provision of chronic care management services.    LCSW returned missed call to patient on 04/29/19 and successfully spoke with her. Patient reports being without one of her medications right now and is in need of it getting refilled and sent over to [facility] pharmacy instead of [facility]. CCM Pharmacist and CCM RNCM were made aware. LCSW encouraged patient to contact [facility] about this medication request. Patient was agreeable. Patient denied any further case management needs this time. CCM RNCM will follow up with patient on 05/01/19.   Dickie La, BSW, MSW, LCSW [facility] Care Management [place]  [facility] [email or web address] Phone: [phone]

## 2019-04-30 ENCOUNTER — Ambulatory Visit: Payer: Self-pay | Admitting: Licensed Clinical Social Worker

## 2019-04-30 ENCOUNTER — Telehealth: Payer: Self-pay | Admitting: Nurse Practitioner

## 2019-04-30 ENCOUNTER — Other Ambulatory Visit: Payer: Self-pay | Admitting: Nurse Practitioner

## 2019-04-30 MED ORDER — NAPROXEN 500 MG PO TABS: 500.0000 mg | ORAL_TABLET | Freq: Two times a day (BID) | ORAL | 0 refills | Status: DC | PRN

## 2019-04-30 NOTE — Telephone Encounter (Signed)
Medication Refill - Medication:  naproxen (NAPROSYN) 500 MG tablet  Has the patient contacted their pharmacy? Yes.   (Agent: If no, request that the patient contact the pharmacy for the refill.) (Agent: If yes, when and what did the pharmacy advise?)  Preferred Pharmacy (with phone number or street name):  [id number] - [place], [place] - [address]  [address] [place] [address]  Phone: [phone] Fax: [phone]     Agent: Please be advised that RX refills may take up to 3 business days. We ask that you follow-up with your pharmacy.

## 2019-04-30 NOTE — Telephone Encounter (Signed)
Tried to call patient to notify her that she will need an appt for additional refills, phone is not working.

## 2019-04-30 NOTE — Telephone Encounter (Signed)
30 day supply only, will need office visit for further as is not scheduled at this time.

## 2019-04-30 NOTE — Chronic Care Management (AMB) (Signed)
  Care Management   Follow Up Note   04/30/2019 Name: Paula Mullins MRN: [id number] DOB: 1970/04/20  Referred by: Marjie Skiff, NP Reason for referral : Care Coordination   Paula Mullins is a 49 y.o. year old female who is a primary care patient of Cannady, Dorie Rank, NP. The care management team was consulted for assistance with care management and care coordination needs.    Review of patient status, including review of consultants reports, relevant laboratory and other test results, and collaboration with appropriate care team members and the patient's provider was performed as part of comprehensive patient evaluation and provision of chronic care management services.    LCSW received voice message from patient again on 04/30/19 stating that she had no heard back from [facility] yet in regards to a medication that she is needing to be refilled. Patient was encouraged to contact [facility] office yesterday but reports not hearing back from anyone yet. LCSW will update CCM team and PCP.   Dickie La, BSW, MSW, LCSW [facility] Care Management [place]  [facility] [email or web address] Phone: [phone]

## 2019-04-30 NOTE — Telephone Encounter (Signed)
Patient requesting a refill of Naproxen 500 mg tablet. No on current med list but has been prescribed this medication previously.  LOV: 08/10/18

## 2019-04-30 NOTE — Telephone Encounter (Signed)
Routing to provider  

## 2019-05-01 ENCOUNTER — Ambulatory Visit: Payer: Self-pay | Admitting: General Practice

## 2019-05-01 ENCOUNTER — Telehealth: Payer: Self-pay

## 2019-05-01 NOTE — Chronic Care Management (AMB) (Signed)
°  Chronic Care Management   Outreach Note  05/01/2019 Name: Paula Mullins MRN: [id number] DOB: Jan 26, 1970  Referred by: Marjie Skiff, NP Reason for referral : Care Coordination (Actually 3rd attempt at initial outreach. Will start back over with 2nd attempt since a new number acquired.)   An unsuccessful telephone outreach was attempted today. The patient was referred to the case management team for assistance with care management and care coordination. This was actually third attempt to reach the patient. No answer and used the J[employer]ber system since obtaining new number.  Will start attempts over to try and engage the patient with the CCM team.   Follow Up Plan: The care management team will reach out to the patient again over the next 30  days.   Alto Denver RN, MSN, CCM Community Care Coordinator [employer]   [facility] Mobile: [phone]

## 2019-05-03 ENCOUNTER — Other Ambulatory Visit: Payer: Self-pay | Admitting: Nurse Practitioner

## 2019-05-03 MED ORDER — NAPROXEN 500 MG PO TABS: 500.0000 mg | ORAL_TABLET | Freq: Two times a day (BID) | ORAL | 0 refills | Status: DC | PRN

## 2019-05-03 NOTE — Telephone Encounter (Signed)
Medication: naproxen (NAPROSYN) 500 MG tablet [[id number]] - medication was sent to the wrong pharmacy. Can it be sent to pharmacy below please  Has the patient contacted their pharmacy? Yes  (Agent: If no, request that the patient contact the pharmacy for the refill.) (Agent: If yes, when and what did the pharmacy advise?)  Preferred Pharmacy (with phone number or street name): [id number] - [place], [place] - [address]  Phone:  [phone] Fax:  [phone]     Agent: Please be advised that RX refills may take up to 3 business days. We ask that you follow-up with your pharmacy.

## 2019-05-06 ENCOUNTER — Ambulatory Visit: Payer: Medicaid Other | Admitting: Nurse Practitioner

## 2019-05-08 ENCOUNTER — Telehealth: Payer: Self-pay

## 2019-05-08 ENCOUNTER — Ambulatory Visit: Payer: Medicaid Other | Admitting: Nurse Practitioner

## 2019-05-08 NOTE — Telephone Encounter (Signed)
Prior Authorization initiated via [employer] for Naproxen Confirmation #: [id number] W  PA Approved.

## 2019-06-05 ENCOUNTER — Ambulatory Visit: Payer: Self-pay | Admitting: General Practice

## 2019-06-05 ENCOUNTER — Telehealth: Payer: Self-pay

## 2019-06-05 NOTE — Chronic Care Management (AMB) (Signed)
  Chronic Care Management   Outreach Note  06/05/2019 Name: Paula Mullins MRN: [id number] DOB: [birth year]  Referred by: Marjie Skiff, NP Reason for referral : Care Coordination (Initial outreach: 2nd attempt: RNCM Chronic Disease Management and Care Coordination needs)   A second unsuccessful telephone outreach was attempted today. The patient was referred to the case management team for assistance with care management and care coordination.   Follow Up Plan: A HIPPA compliant phone message was left for the patient providing contact information and requesting a return call.    Alto Denver RN, MSN, CCM Community Care Coordinator [place]  [facility] Mobile: [phone]

## 2019-06-06 ENCOUNTER — Telehealth: Payer: Self-pay | Admitting: Nurse Practitioner

## 2019-06-06 NOTE — Chronic Care Management (AMB) (Signed)
  Care Management   Note  06/06/2019 Name: LEGACIE DILLINGHAM MRN: [id number] DOB: Jul 02, 1970  DOVIE KAPUSTA is a 49 y.o. year old female who is a primary care patient of Marjie Skiff, NP and is actively engaged with the care management team. I reached out to [name] by phone today to assist with re-scheduling an initial visit with the RN Case Manager  Follow up plan: Unsuccessful telephone outreach attempt made. A HIPPA compliant phone message was left for the patient providing contact information and requesting a return call.  The care management team will reach out to the patient again over the next 7 days.  If patient returns call to provider office, please advise to call Embedded Care Management Care Guide Penne Lash  at [phone]   Penne Lash, RMA Care Guide, Embedded Care Coordination [facility]  [place], [place] [address] Direct Dial: [phone] [email or web address]: [email or web address]

## 2019-06-07 ENCOUNTER — Ambulatory Visit: Payer: Self-pay | Admitting: Licensed Clinical Social Worker

## 2019-06-07 NOTE — Chronic Care Management (AMB) (Signed)
  Care Management   Follow Up Note   06/07/2019 Name: RHYLI DEPAULA MRN: [id number] DOB: 07/27/1970  Referred by: Marjie Skiff, NP Reason for referral : Care Coordination   YAVONNE KISS is a 49 y.o. year old female who is a primary care patient of Cannady, Dorie Rank, NP. The care management team was consulted for assistance with care management and care coordination needs.    Review of patient status, including review of consultants reports, relevant laboratory and other test results, and collaboration with appropriate care team members and the patient's provider was performed as part of comprehensive patient evaluation and provision of chronic care management services.   LCSW completed CCM outreach attempt today but was unable to reach patient successfully. A HIPPA compliant voice message was left encouraging patient to return call once available. LCSW rescheduled CCM SW appointment as well.   A HIPPA compliant phone message was left for the patient providing contact information and requesting a return call.   Dickie La, BSW, MSW, LCSW [facility] Care Management [place]  [facility] [email or web address] Phone: [phone]

## 2019-06-10 ENCOUNTER — Ambulatory Visit: Payer: Self-pay | Admitting: Family Medicine

## 2019-06-10 NOTE — Chronic Care Management (AMB) (Signed)
  Care Management   Note  06/10/2019 Name: [name] MRN: [id number] DOB: [birth year]  [name] is a 49 y.o. year old female who is a primary care patient of Marjie Skiff, NP and is actively engaged with the care management team. I reached out to [name] by phone today to assist with re-scheduling a follow up visit with the RN Case Manager  Follow up plan: Unsuccessful telephone outreach attempt made. A HIPPA compliant phone message was left for the patient providing contact information and requesting a return call.  The care management team will reach out to the patient again over the next 7 days.  If patient returns call to provider office, please advise to call Embedded Care Management Care Guide Penne Lash  at [phone]  Penne Lash, RMA Care Guide, Embedded Care Coordination [facility]  [place], [place] [address] Direct Dial: [phone] [email or web address]

## 2019-06-11 ENCOUNTER — Ambulatory Visit: Payer: Self-pay | Admitting: Family Medicine

## 2019-06-18 NOTE — Chronic Care Management (AMB) (Signed)
  Care Management   Note  06/18/2019 Name: Paula Mullins MRN: [id number] DOB: 30-Jun-1970  Paula Mullins is a 49 y.o. year old female who is a primary care patient of Marjie Skiff, NP and is actively engaged with the care management team. I reached out to [name] by phone today to assist with re-scheduling a follow up visit with the RN Case Manager  Follow up plan: Unable to make contact on outreach attempts x 3. PCP Aura Dials NP and Alto Denver RN CM  notified via routed documentation in medical record.   Penne Lash, RMA Care Guide, Embedded Care Coordination [facility]  [place], [place] [address] Direct Dial: [phone] [email or web address]: [email or web address]

## 2019-07-26 ENCOUNTER — Telehealth: Payer: Self-pay

## 2019-08-16 ENCOUNTER — Emergency Department (HOSPITAL_COMMUNITY)
Admission: EM | Admit: 2019-08-16 | Discharge: 2019-08-16 | Disposition: A | Discharge status: Home / Self Care | Payer: Self-pay | Attending: Emergency Medicine | Admitting: Emergency Medicine

## 2019-08-16 ENCOUNTER — Emergency Department (HOSPITAL_COMMUNITY): Payer: Self-pay

## 2019-08-16 ENCOUNTER — Encounter (HOSPITAL_COMMUNITY): Payer: Self-pay | Admitting: *Deleted

## 2019-08-16 DIAGNOSIS — S0083XA Contusion of other part of head, initial encounter: Secondary | ICD-10-CM | POA: Insufficient documentation

## 2019-08-16 DIAGNOSIS — Y939 Activity, unspecified: Secondary | ICD-10-CM | POA: Insufficient documentation

## 2019-08-16 DIAGNOSIS — S0502XA Injury of conjunctiva and corneal abrasion without foreign body, left eye, initial encounter: Secondary | ICD-10-CM

## 2019-08-16 DIAGNOSIS — Z79899 Other long term (current) drug therapy: Secondary | ICD-10-CM | POA: Insufficient documentation

## 2019-08-16 DIAGNOSIS — Y929 Unspecified place or not applicable: Secondary | ICD-10-CM | POA: Insufficient documentation

## 2019-08-16 DIAGNOSIS — I10 Essential (primary) hypertension: Secondary | ICD-10-CM | POA: Insufficient documentation

## 2019-08-16 DIAGNOSIS — R519 Headache, unspecified: Secondary | ICD-10-CM | POA: Insufficient documentation

## 2019-08-16 DIAGNOSIS — Y999 Unspecified external cause status: Secondary | ICD-10-CM | POA: Insufficient documentation

## 2019-08-16 HISTORY — DX: Essential (primary) hypertension: I10

## 2019-08-16 MED ORDER — TOBRAMYCIN 0.3 % OP SOLN
2.0000 [drp] | OPHTHALMIC | Status: DC
  Filled 2019-08-16: qty 5

## 2019-08-16 MED ORDER — IBUPROFEN 800 MG PO TABS
800.0000 mg | ORAL_TABLET | Freq: Once | ORAL | Status: DC
  Filled 2019-08-16: qty 1

## 2019-08-16 NOTE — ED Notes (Addendum)
Went to round on patient at approximately 1510, pt not in room.  Went in to discharge pt, pt not found in room or bathroom. PA aware. Per PA check back in approximately 30 minutes and if pt not in room, discharge pt out.

## 2019-08-16 NOTE — Discharge Instructions (Signed)
Return if any problems.
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
## 2019-08-16 NOTE — ED Notes (Signed)
Pt did not notify ED staff that pt was leaving. Pt left without discharge instructions or medication that was ordered.

## 2020-02-28 ENCOUNTER — Ambulatory Visit (INDEPENDENT_AMBULATORY_CARE_PROVIDER_SITE_OTHER): Payer: Self-pay | Admitting: Nurse Practitioner

## 2020-02-28 ENCOUNTER — Encounter: Payer: Self-pay | Admitting: Nurse Practitioner

## 2020-02-28 ENCOUNTER — Other Ambulatory Visit: Payer: Self-pay

## 2020-02-28 VITALS — BP 132/80 | HR 77 | Temp 97.9°F | Ht 64.0 in | Wt 135.2 lb

## 2020-02-28 DIAGNOSIS — D649 Anemia, unspecified: Secondary | ICD-10-CM

## 2020-02-28 DIAGNOSIS — Z1211 Encounter for screening for malignant neoplasm of colon: Secondary | ICD-10-CM

## 2020-02-28 DIAGNOSIS — F1410 Cocaine abuse, uncomplicated: Secondary | ICD-10-CM

## 2020-02-28 DIAGNOSIS — R7301 Impaired fasting glucose: Secondary | ICD-10-CM

## 2020-02-28 DIAGNOSIS — N926 Irregular menstruation, unspecified: Secondary | ICD-10-CM

## 2020-02-28 DIAGNOSIS — Z1231 Encounter for screening mammogram for malignant neoplasm of breast: Secondary | ICD-10-CM

## 2020-02-28 DIAGNOSIS — Z833 Family history of diabetes mellitus: Secondary | ICD-10-CM

## 2020-02-28 DIAGNOSIS — E559 Vitamin D deficiency, unspecified: Secondary | ICD-10-CM

## 2020-02-28 DIAGNOSIS — F319 Bipolar disorder, unspecified: Secondary | ICD-10-CM

## 2020-02-28 DIAGNOSIS — I10 Essential (primary) hypertension: Secondary | ICD-10-CM

## 2020-02-28 NOTE — Patient Instructions (Signed)
Healthy Eating Following a healthy eating pattern may help you to achieve and maintain a healthy body weight, reduce the risk of chronic disease, and live a long and productive life. It is important to follow a healthy eating pattern at an appropriate calorie level for your body. Your nutritional needs should be met primarily through food by choosing a variety of nutrient-rich foods. What are tips for following this plan? Reading food labels  Read labels and choose the following: ? Reduced or low sodium. ? Juices with 100% fruit juice. ? Foods with low saturated fats and high polyunsaturated and monounsaturated fats. ? Foods with whole grains, such as whole wheat, cracked wheat, brown rice, and wild rice. ? Whole grains that are fortified with folic acid. This is recommended for women who are pregnant or who want to become pregnant.  Read labels and avoid the following: ? Foods with a lot of added sugars. These include foods that contain brown sugar, corn sweetener, corn syrup, dextrose, fructose, glucose, high-fructose corn syrup, honey, invert sugar, lactose, malt syrup, maltose, molasses, raw sugar, sucrose, trehalose, or turbinado sugar.  Do not eat more than the following amounts of added sugar per day:  6 teaspoons (25 g) for women.  9 teaspoons (38 g) for men. ? Foods that contain processed or refined starches and grains. ? Refined grain products, such as white flour, degermed cornmeal, white bread, and white rice. Shopping  Choose nutrient-rich snacks, such as vegetables, whole fruits, and nuts. Avoid high-calorie and high-sugar snacks, such as potato chips, fruit snacks, and candy.  Use oil-based dressings and spreads on foods instead of solid fats such as butter, stick margarine, or cream cheese.  Limit pre-made sauces, mixes, and "instant" products such as flavored rice, instant noodles, and ready-made pasta.  Try more plant-protein sources, such as tofu, tempeh, black beans,  edamame, lentils, nuts, and seeds.  Explore eating plans such as the Mediterranean diet or vegetarian diet. Cooking  Use oil to saut or stir-fry foods instead of solid fats such as butter, stick margarine, or lard.  Try baking, boiling, grilling, or broiling instead of frying.  Remove the fatty part of meats before cooking.  Steam vegetables in water or broth. Meal planning  At meals, imagine dividing your plate into fourths: ? One-half of your plate is fruits and vegetables. ? One-fourth of your plate is whole grains. ? One-fourth of your plate is protein, especially lean meats, poultry, eggs, tofu, beans, or nuts.  Include low-fat dairy as part of your daily diet.   Lifestyle  Choose healthy options in all settings, including home, work, school, restaurants, or stores.  Prepare your food safely: ? Wash your hands after handling raw meats. ? Keep food preparation surfaces clean by regularly washing with hot, soapy water. ? Keep raw meats separate from ready-to-eat foods, such as fruits and vegetables. ? Cook seafood, meat, poultry, and eggs to the recommended internal temperature. ? Store foods at safe temperatures. In general:  Keep cold foods at 7F (4.4C) or below.  Keep hot foods at 17F (60C) or above.  Keep your freezer at [facility] (-17.8C) or below.  Foods are no longer safe to eat when they have been between the temperatures of 40-17F (4.4-60C) for more than 2 hours. What foods should I eat? Fruits Aim to eat 2 cup-equivalents of fresh, canned (in natural juice), or frozen fruits each day. Examples of 1 cup-equivalent of fruit include 1 small apple, 8 large strawberries, 1 cup canned fruit,  cup dried fruit, or 1 cup 100% juice. Vegetables Aim to eat 2-3 cup-equivalents of fresh and frozen vegetables each day, including different varieties and colors. Examples of 1 cup-equivalent of vegetables include 2 medium carrots, 2 cups raw, leafy greens, 1 cup chopped  vegetable (raw or cooked), or 1 medium baked potato. Grains Aim to eat 6 ounce-equivalents of whole grains each day. Examples of 1 ounce-equivalent of grains include 1 slice of bread, 1 cup ready-to-eat cereal, 3 cups popcorn, or  cup cooked rice, pasta, or cereal. Meats and other proteins Aim to eat 5-6 ounce-equivalents of protein each day. Examples of 1 ounce-equivalent of protein include 1 egg, 1/2 cup nuts or seeds, or 1 tablespoon (16 g) peanut butter. A cut of meat or fish that is the size of a deck of cards is about 3-4 ounce-equivalents.  Of the protein you eat each week, try to have at least 8 ounces come from seafood. This includes salmon, trout, herring, and anchovies. Dairy Aim to eat 3 cup-equivalents of fat-free or low-fat dairy each day. Examples of 1 cup-equivalent of dairy include 1 cup (240 mL) milk, 8 ounces (250 g) yogurt, 1 ounces (44 g) natural cheese, or 1 cup (240 mL) fortified soy milk. Fats and oils  Aim for about 5 teaspoons (21 g) per day. Choose monounsaturated fats, such as canola and olive oils, avocados, peanut butter, and most nuts, or polyunsaturated fats, such as sunflower, corn, and soybean oils, walnuts, pine nuts, sesame seeds, sunflower seeds, and flaxseed. Beverages  Aim for six 8-oz glasses of water per day. Limit coffee to three to five 8-oz cups per day.  Limit caffeinated beverages that have added calories, such as soda and energy drinks.  Limit alcohol intake to no more than 1 drink a day for nonpregnant women and 2 drinks a day for men. One drink equals 12 oz of beer (355 mL), 5 oz of wine (148 mL), or 1 oz of hard liquor (44 mL). Seasoning and other foods  Avoid adding excess amounts of salt to your foods. Try flavoring foods with herbs and spices instead of salt.  Avoid adding sugar to foods.  Try using oil-based dressings, sauces, and spreads instead of solid fats. This information is based on general [place] nutrition guidelines. For more  information, visit choosemyplate.gov. Exact amounts may vary based on your nutrition needs. Summary  A healthy eating plan may help you to maintain a healthy weight, reduce the risk of chronic diseases, and stay active throughout your life.  Plan your meals. Make sure you eat the right portions of a variety of nutrient-rich foods.  Try baking, boiling, grilling, or broiling instead of frying.  Choose healthy options in all settings, including home, work, school, restaurants, or stores. This information is not intended to replace advice given to you by your health care provider. Make sure you discuss any questions you have with your health care provider. Document Revised: 04/03/2017 Document Reviewed: 04/03/2017 Elsevier Patient Education  2021 [employer]  

## 2020-02-28 NOTE — Progress Notes (Signed)
BP 132/80 (BP Location: Left Arm)   Pulse 77   Temp 97.9 F (36.6 C) (Oral)   Ht 5\' 4"  (1.626 m)   Wt 135 lb 3.2 oz (61.3 kg)   LMP 01/24/2020 (Exact Date)   SpO2 100%   BMI 23.21 kg/m    Subjective:    Patient ID: 01/[id number], female    DOB: [birth year], 50 y.o.   MRN: [id number]  HPI: Paula Mullins is a 50 y.o. female presenting on 02/28/2020 for comprehensive medical examination. Current medical complaints include:none  She currently lives with: daughter Menopausal Symptoms: yes -- cycles more irregular, last one was in October, at beginning heavier and then lighter -- last a month or two -- currently having some bleeding still, started January 15th, light in consistency -- cramping present and occasionally feels tired  HYPERTENSION Currently no medications, has not taken in the past.  Was ordered Amlodipine in past, but did not take.  No recent cocaine use, last used 3 days ago.  Uses about 2-3 times a week.  Not interested in rehab or group.  Has been using since 2018.   Hypertension status: stable  BP monitoring frequency:  not checking BP range:  Aspirin: no Recurrent headaches: no Visual changes: no Palpitations: no Dyspnea: no Chest pain: no Lower extremity edema: no Dizzy/lightheaded: no   Depression Screen done today and results listed below:  Depression screen [facility] 2/9 02/28/2020 08/10/2018 02/03/2017 02/03/2017 10/28/2014  Decreased Interest 0 0 2 0 0  Down, Depressed, Hopeless 0 0 3 0 0  PHQ - 2 Score 0 0 5 0 0  Altered sleeping 3 2 3  - -  Tired, decreased energy 3 2 2  - -  Change in appetite 0 0 1 - -  Feeling bad or failure about yourself  0 0 2 - -  Trouble concentrating 0 0 0 - -  Moving slowly or fidgety/restless 0 0 0 - -  Suicidal thoughts 0 0 0 - -  PHQ-9 Score 6 4 13  - -  Difficult doing work/chores Somewhat difficult Not difficult at all - - -    The patient does not have a history of falls. I did not complete a risk assessment for falls. A  plan of care for falls was not documented.   Past Medical History:  Past Medical History:  Diagnosis Date  . Anxiety    Pt reports anxiety  . Hypertension   . Migraine   . Sleep apnea     Surgical History:  Past Surgical History:  Procedure Laterality Date  . CESAREAN SECTION  2011  . HERNIA REPAIR  2012  . TUBAL LIGATION      Medications:  Current Outpatient Medications on File Prior to Visit  Medication Sig  . naproxen (NAPROSYN) 500 MG tablet Take 1 tablet (500 mg total) by mouth 2 (two) times daily as needed for moderate pain.   No current facility-administered medications on file prior to visit.    Allergies:  Allergies  Allergen Reactions  . Flagyl [Metronidazole] Swelling    Social History:  Social History   Socioeconomic History  . Marital status: Legally Separated    Spouse name: Not on file  . Number of children: 8  . Years of education: Not on file  . Highest education level: Not on file  Occupational History  . Occupation: Unemployed  Tobacco Use  . Smoking status: Never Smoker  . Smokeless tobacco: Never Used  Vaping Use  .  Vaping Use: Never used  Substance and Sexual Activity  . Alcohol use: Yes    Comment: occasionally  . Drug use: Yes    Frequency: 1.0 times per week    Types: Cocaine    Comment: patient has used within the last week  . Sexual activity: Yes    Birth control/protection: Surgical  Other Topics Concern  . Not on file  Social History Narrative   [place]   Has had 8 children   Single/separated.   3 kids at home.    Social Determinants of Health   Financial Resource Strain: Not on file  Food Insecurity: Not on file  Transportation Needs: Not on file  Physical Activity: Not on file  Stress: Not on file  Social Connections: Not on file  Intimate Partner Violence: Not on file   Social History   Tobacco Use  Smoking Status Never Smoker  Smokeless Tobacco Never Used   Social History   Substance  and Sexual Activity  Alcohol Use Yes   Comment: occasionally    Family History:  Family History  Problem Relation Age of Onset  . Cancer Sister        Ovarian  . Cancer Maternal Grandmother        Leukemia  . Asthma Mother   . Allergies Mother     Past medical history, surgical history, medications, allergies, family history and social history reviewed with patient today and changes made to appropriate areas of the chart.   ROS All other ROS negative except what is listed above and in the HPI.      Objective:    BP 132/80 (BP Location: Left Arm)   Pulse 77   Temp 97.9 F (36.6 C) (Oral)   Ht 5\' 4"  (1.626 m)   Wt 135 lb 3.2 oz (61.3 kg)   LMP 01/24/2020 (Exact Date)   SpO2 100%   BMI 23.21 kg/m   Wt Readings from Last 3 Encounters:  02/28/20 135 lb 3.2 oz (61.3 kg)  08/16/19 130 lb (59 kg)  01/01/19 140 lb (63.5 kg)    Physical Exam Vitals and nursing note reviewed.  Constitutional:      General: She is awake. She is not in acute distress.    Appearance: She is well-developed. She is not ill-appearing.  HENT:     Head: Normocephalic and atraumatic.     Right Ear: Hearing, tympanic membrane, ear canal and external ear normal. No drainage.     Left Ear: Hearing, tympanic membrane, ear canal and external ear normal. No drainage.     Nose: Nose normal.     Right Sinus: No maxillary sinus tenderness or frontal sinus tenderness.     Left Sinus: No maxillary sinus tenderness or frontal sinus tenderness.     Mouth/Throat:     Mouth: Mucous membranes are moist.     Pharynx: Oropharynx is clear. Uvula midline. No pharyngeal swelling, oropharyngeal exudate or posterior oropharyngeal erythema.  Eyes:     General: Lids are normal.        Right eye: No discharge.        Left eye: No discharge.     Extraocular Movements: Extraocular movements intact.     Conjunctiva/sclera: Conjunctivae normal.     Pupils: Pupils are equal, round, and reactive to light.     Visual Fields:  Right eye visual fields normal and left eye visual fields normal.  Neck:     Thyroid: No thyromegaly.  Vascular: No carotid bruit.     Trachea: Trachea normal.  Cardiovascular:     Rate and Rhythm: Normal rate and regular rhythm.     Heart sounds: Normal heart sounds. No murmur heard. No gallop.   Pulmonary:     Effort: Pulmonary effort is normal. No accessory muscle usage or respiratory distress.     Breath sounds: Normal breath sounds.  Chest:  Breasts:     Right: Normal. No axillary adenopathy or supraclavicular adenopathy.     Left: Normal. No axillary adenopathy or supraclavicular adenopathy.    Abdominal:     General: Bowel sounds are normal.     Palpations: Abdomen is soft. There is no hepatomegaly or splenomegaly.     Tenderness: There is no abdominal tenderness.  Musculoskeletal:        General: Normal range of motion.     Cervical back: Normal range of motion and neck supple.     Right lower leg: No edema.     Left lower leg: No edema.  Lymphadenopathy:     Head:     Right side of head: No submental, submandibular, tonsillar, preauricular or posterior auricular adenopathy.     Left side of head: No submental, submandibular, tonsillar, preauricular or posterior auricular adenopathy.     Cervical: No cervical adenopathy.     Upper Body:     Right upper body: No supraclavicular, axillary or pectoral adenopathy.     Left upper body: No supraclavicular, axillary or pectoral adenopathy.  Skin:    General: Skin is warm and dry.     Capillary Refill: Capillary refill takes less than 2 seconds.     Findings: No rash.  Neurological:     Mental Status: She is alert and oriented to person, place, and time.     Cranial Nerves: Cranial nerves are intact.     Gait: Gait is intact.     Deep Tendon Reflexes: Reflexes are normal and symmetric.     Reflex Scores:      Brachioradialis reflexes are 2+ on the right side and 2+ on the left side.      Patellar reflexes are 2+ on  the right side and 2+ on the left side. Psychiatric:        Attention and Perception: Attention normal.        Mood and Affect: Mood normal.        Speech: Speech normal.        Behavior: Behavior normal. Behavior is cooperative.        Thought Content: Thought content normal.        Judgment: Judgment normal.    Results for orders placed or performed during the hospital encounter of 01/01/19  Wet prep, genital   Specimen: PATH Cytology Cervicovaginal Ancillary Only  Result Value Ref Range   Yeast Wet Prep HPF POC NONE SEEN NONE SEEN   Trich, Wet Prep NONE SEEN NONE SEEN   Clue Cells Wet Prep HPF POC NONE SEEN NONE SEEN   WBC, Wet Prep HPF POC NONE SEEN NONE SEEN   Sperm NONE SEEN   CBC with Differential  Result Value Ref Range   WBC 4.0 4.0 - 10.5 K/uL   RBC 3.55 (L) 3.87 - 5.11 MIL/uL   Hemoglobin 10.8 (L) 12.0 - 15.0 g/dL   HCT 16.1 (L) 09.6 - 04.5 %   MCV 93.8 80.0 - 100.0 fL   MCH 30.4 26.0 - 34.0 pg   MCHC 32.4 30.0 - 36.0  g/dL   RDW 16.1 09.6 - 04.5 %   Platelets 248 150 - 400 K/uL   nRBC 0.0 0.0 - 0.2 %   Neutrophils Relative % 58 %   Neutro Abs 2.4 1.7 - 7.7 K/uL   Lymphocytes Relative 31 %   Lymphs Abs 1.2 0.7 - 4.0 K/uL   Monocytes Relative 8 %   Monocytes Absolute 0.3 0.1 - 1.0 K/uL   Eosinophils Relative 2 %   Eosinophils Absolute 0.1 0.0 - 0.5 K/uL   Basophils Relative 1 %   Basophils Absolute 0.0 0.0 - 0.1 K/uL   Immature Granulocytes 0 %   Abs Immature Granulocytes 0.01 0.00 - 0.07 K/uL  Basic metabolic panel  Result Value Ref Range   Sodium 139 135 - 145 mmol/L   Potassium 3.6 3.5 - 5.1 mmol/L   Chloride 106 98 - 111 mmol/L   CO2 25 22 - 32 mmol/L   Glucose, Bld 147 (H) 70 - 99 mg/dL   BUN 15 6 - 20 mg/dL   Creatinine, Ser 4.09 (H) 0.44 - 1.00 mg/dL   Calcium 8.9 8.9 - 81.1 mg/dL   GFR calc non Af Amer 53 (L) >60 mL/min   GFR calc Af Amer >60 >60 mL/min   Anion gap 8 5 - 15  RPR  Result Value Ref Range   RPR Ser Ql NON REACTIVE NON REACTIVE   HIV Antibody (routine testing w rflx)  Result Value Ref Range   HIV Screen 4th Generation wRfx NON REACTIVE NON REACTIVE  POC urine preg, ED  Result Value Ref Range   Preg Test, Ur NEGATIVE NEGATIVE  GC/Chlamydia probe amp  Result Value Ref Range   Chlamydia Negative    Neisseria Gonorrhea Negative       Assessment & Plan:   Problem List Items Addressed This Visit      Cardiovascular and Mediastinum   Essential hypertension    Suspect exacerbated by substance abuse, initial BP elevated but repeat at goal.  Have recommended complete cessation of cocaine use due to major risk for MI or stroke, will continue to discuss this at visits.  She refuses rehab or group therapy.  No current medications.  Recommend she monitor BP at least a few mornings a week at home and document.  DASH diet at home.  Labs today: CBC, CMP, TSH.  Return in one year for physical, sooner if increased BP.       Relevant Orders   CBC with Differential/Platelet   Comprehensive metabolic panel   Lipid Panel w/o Chol/HDL Ratio   TSH     Endocrine   IFG (impaired fasting glucose)    Recheck A1c today, has family history T2DM.        Other   Anemia    Presented in past, no current supplements, but having irregular periods and is interested in GYN referral.  Labs today CBC and iron levels.      Relevant Orders   CBC with Differential/Platelet   Iron, TIBC and Ferritin Panel   Cocaine abuse (HCC)    Have recommended complete cessation and discussed at length risks with HTN, to include stroke or MI.  Refuses rehab or group therapy.  Continues to actively use.      Bipolar I disorder (HCC) - Primary    Chronic, ongoing.  No current medications, refuses.  Denies SI/HI.  Have discussed with patient at length that if more intense therapy is required will place psychiatry referral, recommended she start back  at [facility] and also obtain therapy, refuses.  Return in one year, sooner if worsening mood.         Other  Visit Diagnoses    Family history of diabetes mellitus       Check A1c today and initiate medication as needed   Relevant Orders   HgB A1c   Vitamin D deficiency       History of low levels reported, check today and initiate supplement as needed.   Relevant Orders   VITAMIN D 25 Hydroxy (Vit-D Deficiency, Fractures)   Irregular periods/menstrual cycles       Referral to GYN per request.  Refuses pap today.   Relevant Orders   Ambulatory referral to Gynecology   Colon cancer screening       GI referral placed   Relevant Orders   Ambulatory referral to Gastroenterology   Encounter for screening mammogram for malignant neoplasm of breast       Mammogram order in and she is to call to schedule   Relevant Orders   MM 3D SCREEN BREAST BILATERAL       Follow up plan: Return in about 1 year (around 02/27/2021) for Annaul exam.   LABORATORY TESTING:  - Pap smear: refuses  IMMUNIZATIONS:   - Tdap: Tetanus vaccination status reviewed: last tetanus booster within 10 years. - Influenza: Refused - Pneumovax: Not applicable - Prevnar: Not applicable - HPV: Not applicable - Zostavax vaccine: Refuses  SCREENING: -Mammogram: Ordered today  - Colonoscopy: Ordered today  - Bone Density: Not applicable  -Hearing Test: Not applicable  -Spirometry: Not applicable   PATIENT COUNSELING:   Advised to take 1 mg of folate supplement per day if capable of pregnancy.   Sexuality: Discussed sexually transmitted diseases, partner selection, use of condoms, avoidance of unintended pregnancy  and contraceptive alternatives.   Advised to avoid cigarette smoking.  I discussed with the patient that most people either abstain from alcohol or drink within safe limits (<=14/week and <=4 drinks/occasion for males, <=7/weeks and <= 3 drinks/occasion for females) and that the risk for alcohol disorders and other health effects rises proportionally with the number of drinks per week and how often a drinker  exceeds daily limits.  Discussed cessation/primary prevention of drug use and availability of treatment for abuse.   Diet: Encouraged to adjust caloric intake to maintain  or achieve ideal body weight, to reduce intake of dietary saturated fat and total fat, to limit sodium intake by avoiding high sodium foods and not adding table salt, and to maintain adequate dietary potassium and calcium preferably from fresh fruits, vegetables, and low-fat dairy products.    Stressed the importance of regular exercise  Injury prevention: Discussed safety belts, safety helmets, smoke detector, smoking near bedding or upholstery.   Dental health: Discussed importance of regular tooth brushing, flossing, and dental visits.    NEXT PREVENTATIVE PHYSICAL DUE IN 1 YEAR. Return in about 1 year (around 02/27/2021) for Annaul exam.

## 2020-02-28 NOTE — Assessment & Plan Note (Signed)
Have recommended complete cessation and discussed at length risks with HTN, to include stroke or MI.  Refuses rehab or group therapy.  Continues to actively use.

## 2020-02-28 NOTE — Assessment & Plan Note (Signed)
Suspect exacerbated by substance abuse, initial BP elevated but repeat at goal.  Have recommended complete cessation of cocaine use due to major risk for MI or stroke, will continue to discuss this at visits.  She refuses rehab or group therapy.  No current medications.  Recommend she monitor BP at least a few mornings a week at home and document.  DASH diet at home.  Labs today: CBC, CMP, TSH.  Return in one year for physical, sooner if increased BP.

## 2020-02-28 NOTE — Assessment & Plan Note (Signed)
Presented in past, no current supplements, but having irregular periods and is interested in GYN referral.  Labs today CBC and iron levels.

## 2020-02-28 NOTE — Assessment & Plan Note (Signed)
Chronic, ongoing.  No current medications, refuses.  Denies SI/HI.  Have discussed with patient at length that if more intense therapy is required will place psychiatry referral, recommended she start back at [facility] and also obtain therapy, refuses.  Return in one year, sooner if worsening mood.

## 2020-02-28 NOTE — Assessment & Plan Note (Signed)
Recheck A1c today, has family history T2DM.

## 2020-02-29 LAB — COMPREHENSIVE METABOLIC PANEL
ALT: 13 IU/L (ref 0–32)
AST: 15 IU/L (ref 0–40)
Albumin/Globulin Ratio: 1.5 (ref 1.2–2.2)
Albumin: 4 g/dL (ref 3.8–4.9)
Alkaline Phosphatase: 76 IU/L (ref 44–121)
BUN/Creatinine Ratio: 8 — ABNORMAL LOW (ref 9–23)
BUN: 11 mg/dL (ref 6–24)
Bilirubin Total: 0.2 mg/dL (ref 0.0–1.2)
CO2: 24 mmol/L (ref 20–29)
Calcium: 9.1 mg/dL (ref 8.7–10.2)
Chloride: 103 mmol/L (ref 96–106)
Creatinine, Ser: 1.37 mg/dL — ABNORMAL HIGH (ref 0.57–1.00)
GFR calc Af Amer: 51 mL/min/{1.73_m2} — ABNORMAL LOW (ref >59)
GFR calc non Af Amer: 44 mL/min/{1.73_m2} — ABNORMAL LOW (ref >59)
Globulin, Total: 2.7 g/dL (ref 1.5–4.5)
Glucose: 91 mg/dL (ref 65–99)
Potassium: 3.8 mmol/L (ref 3.5–5.2)
Sodium: 140 mmol/L (ref 134–144)
Total Protein: 6.7 g/dL (ref 6.0–8.5)

## 2020-02-29 LAB — CBC WITH DIFFERENTIAL/PLATELET
Basophils Absolute: 0 10*3/uL (ref 0.0–0.2)
Basos: 1 %
EOS (ABSOLUTE): 0.1 10*3/uL (ref 0.0–0.4)
Eos: 3 %
Hematocrit: 26.9 % — ABNORMAL LOW (ref 34.0–46.6)
Hemoglobin: 8.3 g/dL — ABNORMAL LOW (ref 11.1–15.9)
Immature Grans (Abs): 0 10*3/uL (ref 0.0–0.1)
Immature Granulocytes: 0 %
Lymphocytes Absolute: 1.1 10*3/uL (ref 0.7–3.1)
Lymphs: 39 %
MCH: 24.8 pg — ABNORMAL LOW (ref 26.6–33.0)
MCHC: 30.9 g/dL — ABNORMAL LOW (ref 31.5–35.7)
MCV: 80 fL (ref 79–97)
Monocytes Absolute: 0.3 10*3/uL (ref 0.1–0.9)
Monocytes: 12 %
Neutrophils Absolute: 1.2 10*3/uL — ABNORMAL LOW (ref 1.4–7.0)
Neutrophils: 45 %
Platelets: 273 10*3/uL (ref 150–450)
RBC: 3.35 x10E6/uL — ABNORMAL LOW (ref 3.77–5.28)
RDW: 15.5 % — ABNORMAL HIGH (ref 11.7–15.4)
WBC: 2.8 10*3/uL — ABNORMAL LOW (ref 3.4–10.8)

## 2020-02-29 LAB — LIPID PANEL W/O CHOL/HDL RATIO
Cholesterol, Total: 138 mg/dL (ref 100–199)
HDL: 70 mg/dL (ref >39)
LDL Chol Calc (NIH): 46 mg/dL (ref 0–99)
Triglycerides: 130 mg/dL (ref 0–149)
VLDL Cholesterol Cal: 22 mg/dL (ref 5–40)

## 2020-02-29 LAB — HEMOGLOBIN A1C
Est. average glucose Bld gHb Est-mCnc: 126 mg/dL
Hgb A1c MFr Bld: 6 % — ABNORMAL HIGH (ref 4.8–5.6)

## 2020-02-29 LAB — IRON,TIBC AND FERRITIN PANEL
Ferritin: 5 ng/mL — ABNORMAL LOW (ref 15–150)
Iron Saturation: 3 % — CL (ref 15–55)
Iron: 14 ug/dL — ABNORMAL LOW (ref 27–159)
Total Iron Binding Capacity: 430 ug/dL (ref 250–450)
UIBC: 416 ug/dL (ref 131–425)

## 2020-02-29 LAB — VITAMIN D 25 HYDROXY (VIT D DEFICIENCY, FRACTURES): Vit D, 25-Hydroxy: 14.7 ng/mL — ABNORMAL LOW (ref 30.0–100.0)

## 2020-02-29 LAB — TSH: TSH: 4.61 u[IU]/mL — ABNORMAL HIGH (ref 0.450–4.500)

## 2020-03-01 ENCOUNTER — Other Ambulatory Visit: Payer: Self-pay | Admitting: Nurse Practitioner

## 2020-03-01 ENCOUNTER — Encounter: Payer: Self-pay | Admitting: Nurse Practitioner

## 2020-03-01 DIAGNOSIS — E559 Vitamin D deficiency, unspecified: Secondary | ICD-10-CM | POA: Insufficient documentation

## 2020-03-01 MED ORDER — CHOLECALCIFEROL 1.25 MG (50000 UT) PO TABS: 1.0000 | ORAL_TABLET | ORAL | 0 refills | Status: DC

## 2020-03-01 MED ORDER — SLOW FE 142 (45 FE) MG PO TBCR: 1.0000 | EXTENDED_RELEASE_TABLET | Freq: Two times a day (BID) | ORAL | 3 refills | Status: DC

## 2020-03-06 ENCOUNTER — Telehealth: Payer: Self-pay

## 2020-03-06 NOTE — Telephone Encounter (Signed)
[facility] referring for Irregular periods/menstrual cycles. Self pay Attempt to call patient to schedule referral. Phone number on file disconnected.

## 2020-03-10 NOTE — Telephone Encounter (Signed)
Called and left voicemail for patient to call back to be scheduled. 

## 2020-03-13 ENCOUNTER — Telehealth: Payer: Self-pay

## 2020-03-18 NOTE — Telephone Encounter (Signed)
Called and left voicemail for patient to call back to be scheduled. 

## 2020-03-30 ENCOUNTER — Ambulatory Visit: Payer: Self-pay | Admitting: Nurse Practitioner

## 2020-04-01 ENCOUNTER — Encounter: Payer: Medicaid Other | Admitting: Obstetrics and Gynecology

## 2020-04-13 ENCOUNTER — Encounter: Payer: Medicaid Other | Admitting: Obstetrics and Gynecology

## 2020-06-30 ENCOUNTER — Ambulatory Visit: Payer: Self-pay | Admitting: *Deleted

## 2020-06-30 NOTE — Telephone Encounter (Signed)
Noted  

## 2020-06-30 NOTE — Telephone Encounter (Signed)
Patient is perimenopausal and she report she has been having prolonged bleeding with large clots. Patient states she feels she is anemic- she is very tired and has no energy to do anything. Offered to make her an appointment- but she does not have proper insurance to cover her visit- call sent to office for review and possible community resources- patient has not followed up at GYN and she needs to be evaluated.  Reason for Disposition  Patient sounds very sick or weak to the triager  Answer Assessment - Initial Assessment Questions 1. AMOUNT: "Describe the bleeding that you are having."    - SPOTTING: spotting, or pinkish / brownish mucous discharge; does not fill panty liner or pad    - MILD:  less than 1 pad / hour; less than patient's usual menstrual bleeding   - MODERATE: 1-2 pads / hour; 1 menstrual cup every 6 hours; small-medium blood clots (e.g., pea, grape, small coin)   - SEVERE: soaking 2 or more pads/hour for 2 or more hours; 1 menstrual cup every 2 hours; bleeding not contained by pads or continuous red blood from vagina; large blood clots (e.g., golf ball, large coin)      Clotting 3 days- mild bleeding amount- clots are heavy 2. ONSET: "When did the bleeding begin?" "Is it continuing now?"      1 1/2 weeks 3. MENSTRUAL PERIOD: "When was the last normal menstrual period?" "How is this different than your period?"     1 year 4. REGULARITY: "How regular are your periods?"     no 5. ABDOMINAL PAIN: "Do you have any pain?" "How bad is the pain?"  (e.g., Scale 1-10; mild, moderate, or severe)   - MILD (1-3): doesn't interfere with normal activities, abdomen soft and not tender to touch    - MODERATE (4-7): interferes with normal activities or awakens from sleep, abdomen tender to touch    - SEVERE (8-10): excruciating pain, doubled over, unable to do any normal activities      Moderate/severe 6. PREGNANCY: "Could you be pregnant?" "Are you sexually active?" "Did you recently give  birth?"     No- BTL 7. BREASTFEEDING: "Are you breastfeeding?"     no 8. HORMONES: "Are you taking any hormone medications, prescription or OTC?" (e.g., birth control pills, estrogen)     no 9. BLOOD THINNERS: "Do you take any blood thinners?" (e.g., Coumadin/warfarin, Pradaxa/dabigatran, aspirin)     no 10. CAUSE: "What do you think is causing the bleeding?" (e.g., recent gyn surgery, recent gyn procedure; known bleeding disorder, cervical cancer, polycystic ovarian disease, fibroids)         premenopausal 11. HEMODYNAMIC STATUS: "Are you weak or feeling lightheaded?" If Yes, ask: "Can you stand and walk normally?"        Weak- has been anemic 12. OTHER SYMPTOMS: "What other symptoms are you having with the bleeding?" (e.g., passed tissue, vaginal discharge, fever, menstrual-type cramps)       Clots- half dollar size  Protocols used: Vaginal Bleeding - Abnormal-A-AH

## 2020-06-30 NOTE — Telephone Encounter (Signed)
Called pt scheduled for 7/8

## 2020-06-30 NOTE — Telephone Encounter (Signed)
Please advise 

## 2020-07-10 ENCOUNTER — Ambulatory Visit: Payer: Self-pay | Admitting: Nurse Practitioner

## 2020-10-25 ENCOUNTER — Inpatient Hospital Stay (HOSPITAL_COMMUNITY)
Admission: EM | Admit: 2020-10-25 | Discharge: 2020-10-27 | DRG: 760 | Disposition: A | Discharge status: Home / Self Care | Payer: Self-pay | Attending: Internal Medicine | Admitting: Internal Medicine

## 2020-10-25 ENCOUNTER — Encounter (HOSPITAL_COMMUNITY): Payer: Self-pay | Admitting: *Deleted

## 2020-10-25 ENCOUNTER — Emergency Department (HOSPITAL_COMMUNITY): Payer: Self-pay

## 2020-10-25 DIAGNOSIS — R7309 Other abnormal glucose: Secondary | ICD-10-CM | POA: Diagnosis present

## 2020-10-25 DIAGNOSIS — I10 Essential (primary) hypertension: Secondary | ICD-10-CM | POA: Diagnosis present

## 2020-10-25 DIAGNOSIS — K029 Dental caries, unspecified: Secondary | ICD-10-CM | POA: Diagnosis present

## 2020-10-25 DIAGNOSIS — F1490 Cocaine use, unspecified, uncomplicated: Secondary | ICD-10-CM | POA: Diagnosis present

## 2020-10-25 DIAGNOSIS — I129 Hypertensive chronic kidney disease with stage 1 through stage 4 chronic kidney disease, or unspecified chronic kidney disease: Secondary | ICD-10-CM | POA: Diagnosis present

## 2020-10-25 DIAGNOSIS — R7302 Impaired glucose tolerance (oral): Secondary | ICD-10-CM | POA: Diagnosis present

## 2020-10-25 DIAGNOSIS — I248 Other forms of acute ischemic heart disease: Secondary | ICD-10-CM | POA: Diagnosis present

## 2020-10-25 DIAGNOSIS — N938 Other specified abnormal uterine and vaginal bleeding: Principal | ICD-10-CM | POA: Diagnosis present

## 2020-10-25 DIAGNOSIS — N183 Chronic kidney disease, stage 3 unspecified: Secondary | ICD-10-CM

## 2020-10-25 DIAGNOSIS — D649 Anemia, unspecified: Secondary | ICD-10-CM

## 2020-10-25 DIAGNOSIS — Z825 Family history of asthma and other chronic lower respiratory diseases: Secondary | ICD-10-CM

## 2020-10-25 DIAGNOSIS — Z20822 Contact with and (suspected) exposure to covid-19: Secondary | ICD-10-CM | POA: Diagnosis present

## 2020-10-25 DIAGNOSIS — N939 Abnormal uterine and vaginal bleeding, unspecified: Secondary | ICD-10-CM | POA: Diagnosis present

## 2020-10-25 DIAGNOSIS — Z806 Family history of leukemia: Secondary | ICD-10-CM

## 2020-10-25 DIAGNOSIS — R7301 Impaired fasting glucose: Secondary | ICD-10-CM | POA: Diagnosis present

## 2020-10-25 DIAGNOSIS — E559 Vitamin D deficiency, unspecified: Secondary | ICD-10-CM | POA: Diagnosis present

## 2020-10-25 DIAGNOSIS — D62 Acute posthemorrhagic anemia: Secondary | ICD-10-CM | POA: Diagnosis present

## 2020-10-25 DIAGNOSIS — Z597 Insufficient social insurance and welfare support: Secondary | ICD-10-CM

## 2020-10-25 DIAGNOSIS — N1831 Chronic kidney disease, stage 3a: Secondary | ICD-10-CM | POA: Diagnosis present

## 2020-10-25 DIAGNOSIS — D509 Iron deficiency anemia, unspecified: Secondary | ICD-10-CM | POA: Diagnosis present

## 2020-10-25 DIAGNOSIS — K0889 Other specified disorders of teeth and supporting structures: Secondary | ICD-10-CM

## 2020-10-25 LAB — URINALYSIS, ROUTINE W REFLEX MICROSCOPIC
Bilirubin Urine: NEGATIVE
Glucose, UA: NEGATIVE mg/dL
Ketones, ur: NEGATIVE mg/dL
Leukocytes,Ua: NEGATIVE
Nitrite: NEGATIVE
Protein, ur: NEGATIVE mg/dL
Specific Gravity, Urine: 1.004 — ABNORMAL LOW (ref 1.005–1.030)
pH: 6 (ref 5.0–8.0)

## 2020-10-25 LAB — HCG, SERUM, QUALITATIVE: Preg, Serum: NEGATIVE

## 2020-10-25 LAB — CBC WITH DIFFERENTIAL/PLATELET
Abs Immature Granulocytes: 0.07 10*3/uL (ref 0.00–0.07)
Basophils Absolute: 0 10*3/uL (ref 0.0–0.1)
Basophils Relative: 1 %
Eosinophils Absolute: 0.1 10*3/uL (ref 0.0–0.5)
Eosinophils Relative: 1 %
HCT: 14.8 % — ABNORMAL LOW (ref 36.0–46.0)
Hemoglobin: 3.8 g/dL — CL (ref 12.0–15.0)
Immature Granulocytes: 1 %
Lymphocytes Relative: 11 %
Lymphs Abs: 0.7 10*3/uL (ref 0.7–4.0)
MCH: 18.8 pg — ABNORMAL LOW (ref 26.0–34.0)
MCHC: 25.7 g/dL — ABNORMAL LOW (ref 30.0–36.0)
MCV: 73.3 fL — ABNORMAL LOW (ref 80.0–100.0)
Monocytes Absolute: 0.7 10*3/uL (ref 0.1–1.0)
Monocytes Relative: 10 %
Neutro Abs: 5.1 10*3/uL (ref 1.7–7.7)
Neutrophils Relative %: 76 %
Platelets: 265 10*3/uL (ref 150–400)
RBC: 2.02 MIL/uL — ABNORMAL LOW (ref 3.87–5.11)
RDW: 19.4 % — ABNORMAL HIGH (ref 11.5–15.5)
WBC: 6.6 10*3/uL (ref 4.0–10.5)
nRBC: 0.5 % — ABNORMAL HIGH (ref 0.0–0.2)

## 2020-10-25 LAB — BASIC METABOLIC PANEL
Anion gap: 8 (ref 5–15)
BUN: 22 mg/dL — ABNORMAL HIGH (ref 6–20)
CO2: 21 mmol/L — ABNORMAL LOW (ref 22–32)
Calcium: 8.6 mg/dL — ABNORMAL LOW (ref 8.9–10.3)
Chloride: 107 mmol/L (ref 98–111)
Creatinine, Ser: 1.38 mg/dL — ABNORMAL HIGH (ref 0.44–1.00)
GFR, Estimated: 46 mL/min — ABNORMAL LOW (ref >60)
Glucose, Bld: 138 mg/dL — ABNORMAL HIGH (ref 70–99)
Potassium: 4.1 mmol/L (ref 3.5–5.1)
Sodium: 136 mmol/L (ref 135–145)

## 2020-10-25 LAB — PREPARE RBC (CROSSMATCH)

## 2020-10-25 LAB — PROTIME-INR
INR: 1.1 (ref 0.8–1.2)
Prothrombin Time: 14.5 seconds (ref 11.4–15.2)

## 2020-10-25 LAB — RESP PANEL BY RT-PCR (FLU A&B, COVID) ARPGX2
Influenza A by PCR: NEGATIVE
Influenza B by PCR: NEGATIVE
SARS Coronavirus 2 by RT PCR: NEGATIVE

## 2020-10-25 MED ORDER — SODIUM CHLORIDE 0.9% IV SOLUTION: Freq: Once | INTRAVENOUS | Status: AC

## 2020-10-25 MED ORDER — IBUPROFEN 400 MG PO TABS
600.0000 mg | ORAL_TABLET | Freq: Four times a day (QID) | ORAL | Status: DC
  Administered 2020-10-25 – 2020-10-26 (×2): 600 mg via ORAL
  Filled 2020-10-25 (×2): qty 2

## 2020-10-25 MED ORDER — HYDROCODONE-ACETAMINOPHEN 5-325 MG PO TABS
1.0000 | ORAL_TABLET | ORAL | Status: DC | PRN
Start: 2020-10-25 — End: 2020-10-27
  Administered 2020-10-26 – 2020-10-27 (×6): 1 via ORAL
  Filled 2020-10-25 (×6): qty 1

## 2020-10-25 MED ORDER — PENICILLIN V POTASSIUM 250 MG PO TABS
500.0000 mg | ORAL_TABLET | Freq: Once | ORAL | Status: AC
  Administered 2020-10-25: 500 mg via ORAL
  Filled 2020-10-25: qty 2

## 2020-10-25 MED ORDER — MEGESTROL ACETATE 40 MG PO TABS: 80.0000 mg | ORAL_TABLET | Freq: Two times a day (BID) | ORAL | Status: DC

## 2020-10-25 MED ORDER — MEGESTROL ACETATE 40 MG PO TABS
80.0000 mg | ORAL_TABLET | Freq: Three times a day (TID) | ORAL | Status: DC
  Administered 2020-10-25 – 2020-10-26 (×2): 80 mg via ORAL
  Filled 2020-10-25 (×8): qty 2

## 2020-10-25 MED ORDER — ONDANSETRON HCL 4 MG/2ML IJ SOLN: 4.0000 mg | Freq: Four times a day (QID) | INTRAMUSCULAR | Status: DC | PRN

## 2020-10-25 MED ORDER — PENICILLIN V POTASSIUM 500 MG PO TABS
500.0000 mg | ORAL_TABLET | Freq: Three times a day (TID) | ORAL | Status: DC
  Filled 2020-10-25 (×2): qty 1

## 2020-10-25 MED ORDER — SODIUM CHLORIDE 0.9 % IV SOLN
10.0000 mL/h | Freq: Once | INTRAVENOUS | Status: AC
  Administered 2020-10-25: 10 mL/h via INTRAVENOUS

## 2020-10-25 MED ORDER — ONDANSETRON HCL 4 MG PO TABS
4.0000 mg | ORAL_TABLET | Freq: Four times a day (QID) | ORAL | Status: DC | PRN
  Administered 2020-10-26: 4 mg via ORAL
  Filled 2020-10-25: qty 1

## 2020-10-25 NOTE — ED Notes (Signed)
X-Ray at bedside.

## 2020-10-25 NOTE — ED Provider Notes (Signed)
[facility] EMERGENCY DEPARTMENT Provider Note   CSN: [id number] Arrival date & time: 10/25/20  1808     History No chief complaint on file.   Paula Mullins is a 50 y.o. female.  She is here for evaluation of multiple complaints.  She has been having moderate vaginal bleeding for 6 months.  Sometimes with some clots.  She is also noticed some tingling in her fingertips.  For 1 month she has been short of breath and having dyspnea on exertion.  Her left molar lower has been hurting for a few months but acutely got worse over the last few days.  She does not have insurance so she has not been able to see a dentist or her primary care doctor.  No chest pain.  No fevers or chills.  She is perimenopausal.  The history is provided by the patient.  Dental Pain Location:  Lower Lower teeth location:  19/LL 1st molar Quality:  Throbbing Severity:  Severe Onset quality:  Gradual Duration:  3 months Timing:  Constant Progression:  Worsening Chronicity:  Recurrent Context: dental caries, dental fracture and poor dentition   Relieved by:  Nothing Worsened by:  Cold food/drink and hot food/drink Ineffective treatments:  NSAIDs Associated symptoms: no facial pain, no fever, no headaches, no neck pain and no oral lesions   Risk factors: lack of dental care   Shortness of Breath Severity:  Moderate Onset quality:  Gradual Duration:  1 month Timing:  Intermittent Progression:  Worsening Chronicity:  New Context: activity   Relieved by:  Nothing Worsened by:  Activity Ineffective treatments:  Rest Associated symptoms: no abdominal pain, no chest pain, no cough, no fever, no headaches, no hemoptysis, no neck pain, no rash, no sore throat and no syncope       Past Medical History:  Diagnosis Date   Anxiety    Pt reports anxiety   Hypertension    Migraine    Sleep apnea     Patient Active Problem List   Diagnosis Date Noted   Vitamin D deficiency 03/01/2020   Essential  hypertension 08/10/2018   Menopause 08/10/2018   Cocaine abuse (HCC) 07/26/2016   Bipolar I disorder (HCC) 07/26/2016   High risk sexual behavior 10/20/2014   IFG (impaired fasting glucose) 10/15/2014   Iron deficiency anemia 10/15/2014   Migraine 10/15/2014    Past Surgical History:  Procedure Laterality Date   CESAREAN SECTION  2011   HERNIA REPAIR  2012   TUBAL LIGATION       OB History   No obstetric history on file.     Family History  Problem Relation Age of Onset   Cancer Sister        Ovarian   Cancer Maternal Grandmother        Leukemia   Asthma Mother    Allergies Mother     Social History   Tobacco Use   Smoking status: Never   Smokeless tobacco: Never  Vaping Use   Vaping Use: Never used  Substance Use Topics   Alcohol use: Yes    Comment: occasionally   Drug use: Yes    Frequency: 1.0 times per week    Types: Cocaine, Marijuana    Comment: patient has used within the last week    Home Medications Prior to Admission medications   Medication Sig Start Date End Date Taking? Authorizing Provider  Cholecalciferol 1.25 MG (50000 UT) TABS Take 1 tablet by mouth once a week. For 8  weeks and then stop.  Return to office for lab draw. 03/01/20   Cannady, Corrie Dandy T, NP  Ferrous Sulfate (SLOW FE) 142 (45 Fe) MG TBCR Take 1 tablet by mouth in the morning and at bedtime. 03/01/20   Cannady, Corrie Dandy T, NP  naproxen (NAPROSYN) 500 MG tablet Take 1 tablet (500 mg total) by mouth 2 (two) times daily as needed for moderate pain. 05/03/19   Marjie Skiff, NP    Allergies    Flagyl [metronidazole]  Review of Systems   Review of Systems  Constitutional:  Negative for fever.  HENT:  Positive for dental problem. Negative for mouth sores and sore throat.   Eyes:  Negative for visual disturbance.  Respiratory:  Positive for shortness of breath. Negative for cough and hemoptysis.   Cardiovascular:  Negative for chest pain and syncope.  Gastrointestinal:  Negative  for abdominal pain.  Genitourinary:  Positive for vaginal bleeding. Negative for dysuria.  Musculoskeletal:  Negative for neck pain.  Skin:  Negative for rash.  Neurological:  Negative for headaches.   Physical Exam Updated Vital Signs BP (!) 148/81   Pulse 92   Temp 98 F (36.7 C) (Oral)   Resp 20   SpO2 100%   Physical Exam Vitals and nursing note reviewed.  Constitutional:      General: She is not in acute distress.    Appearance: Normal appearance. She is well-developed.  HENT:     Head: Normocephalic and atraumatic.     Mouth/Throat:     Mouth: Mucous membranes are moist.     Pharynx: Oropharynx is clear.     Comments: She has a cracked molar on the lower left side without clear abscess.  No trismus Eyes:     Conjunctiva/sclera: Conjunctivae normal.  Cardiovascular:     Rate and Rhythm: Regular rhythm. Tachycardia present.     Heart sounds: No murmur heard. Pulmonary:     Effort: Pulmonary effort is normal. No respiratory distress.     Breath sounds: Normal breath sounds.  Abdominal:     Palpations: Abdomen is soft.     Tenderness: There is no abdominal tenderness. There is no guarding or rebound.  Musculoskeletal:        General: No deformity or signs of injury. Normal range of motion.     Cervical back: Normal range of motion and neck supple.     Right lower leg: No edema.     Left lower leg: No edema.  Skin:    General: Skin is warm and dry.     Coloration: Skin is pale.  Neurological:     General: No focal deficit present.     Mental Status: She is alert.    ED Results / Procedures / Treatments   Labs (all labs ordered are listed, but only abnormal results are displayed) Labs Reviewed  BASIC METABOLIC PANEL - Abnormal; Notable for the following components:      Result Value   CO2 21 (*)    Glucose, Bld 138 (*)    BUN 22 (*)    Creatinine, Ser 1.38 (*)    Calcium 8.6 (*)    GFR, Estimated 46 (*)    All other components within normal limits  CBC  WITH DIFFERENTIAL/PLATELET - Abnormal; Notable for the following components:   RBC 2.02 (*)    Hemoglobin 3.8 (*)    HCT 14.8 (*)    MCV 73.3 (*)    MCH 18.8 (*)    MCHC  25.7 (*)    RDW 19.4 (*)    nRBC 0.5 (*)    All other components within normal limits  URINALYSIS, ROUTINE W REFLEX MICROSCOPIC - Abnormal; Notable for the following components:   Color, Urine STRAW (*)    Specific Gravity, Urine 1.004 (*)    Hgb urine dipstick LARGE (*)    Bacteria, UA RARE (*)    All other components within normal limits  CBC - Abnormal; Notable for the following components:   RBC 3.21 (*)    Hemoglobin 7.3 (*)    HCT 24.7 (*)    MCV 76.9 (*)    MCH 22.7 (*)    MCHC 29.6 (*)    RDW 18.7 (*)    nRBC 0.4 (*)    All other components within normal limits  HEMOGLOBIN AND HEMATOCRIT, BLOOD - Abnormal; Notable for the following components:   Hemoglobin 4.8 (*)    HCT 17.2 (*)    All other components within normal limits  IRON AND TIBC - Abnormal; Notable for the following components:   Iron 12 (*)    TIBC 500 (*)    Saturation Ratios 2 (*)    All other components within normal limits  FERRITIN - Abnormal; Notable for the following components:   Ferritin 3 (*)    All other components within normal limits  RETICULOCYTES - Abnormal; Notable for the following components:   RBC. 2.07 (*)    Immature Retic Fract 30.2 (*)    All other components within normal limits  RAPID URINE DRUG SCREEN, HOSP PERFORMED - Abnormal; Notable for the following components:   Cocaine POSITIVE (*)    All other components within normal limits  VITAMIN D 25 HYDROXY (VIT D DEFICIENCY, FRACTURES) - Abnormal; Notable for the following components:   Vit D, 25-Hydroxy 13.59 (*)    All other components within normal limits  TROPONIN I (HIGH SENSITIVITY) - Abnormal; Notable for the following components:   Troponin I (High Sensitivity) 19 (*)    All other components within normal limits  TROPONIN I (HIGH SENSITIVITY) -  Abnormal; Notable for the following components:   Troponin I (High Sensitivity) 22 (*)    All other components within normal limits  RESP PANEL BY RT-PCR (FLU A&B, COVID) ARPGX2  MRSA NEXT GEN BY PCR, NASAL  HCG, SERUM, QUALITATIVE  PROTIME-INR  HIV ANTIBODY (ROUTINE TESTING W REFLEX)  TSH  HEMOGLOBIN A1C  VITAMIN B12  FOLATE  TYPE AND SCREEN  PREPARE RBC (CROSSMATCH)  PREPARE RBC (CROSSMATCH)  PREPARE FRESH FROZEN PLASMA    EKG EKG Interpretation  Date/Time:  Sunday October 25 2020 19:26:49 EDT Ventricular Rate:  93 PR Interval:  107 QRS Duration: 91 QT Interval:  368 QTC Calculation: 458 R Axis:   56 Text Interpretation: Sinus rhythm Short PR interval Probable left atrial enlargement RSR' in V1 or V2, right VCD or RVH Baseline wander in lead(s) II III aVF V2 No old tracing to compare Confirmed by Meridee Score [phone]) on 10/25/2020 8:01:49 PM  Radiology DG Chest Port 1 View  Result Date: 10/25/2020 CLINICAL DATA:  Shortness of breath. EXAM: PORTABLE CHEST 1 VIEW COMPARISON:  Chest radiograph dated 11/13/2016. FINDINGS: No focal consolidation, pleural effusion, or pneumothorax. The cardiac silhouette is within limits. No acute osseous pathology. IMPRESSION: No active disease. Electronically Signed   By: Elgie Collard M.D.   On: 10/25/2020 20:42    Procedures .Critical Care Performed by: Terrilee Files, MD Authorized by: Terrilee Files, MD  Critical care provider statement:    Critical care time (minutes):  45   Critical care time was exclusive of:  Separately billable procedures and treating other patients   Critical care was necessary to treat or prevent imminent or life-threatening deterioration of the following conditions:  Circulatory failure   Critical care was time spent personally by me on the following activities:  Development of treatment plan with patient or surrogate, discussions with consultants, evaluation of patient's response to treatment,  examination of patient, obtaining history from patient or surrogate, ordering and performing treatments and interventions, ordering and review of laboratory studies, ordering and review of radiographic studies, pulse oximetry, re-evaluation of patient's condition and review of old charts   I assumed direction of critical care for this patient from another provider in my specialty: no     Medications Ordered in ED Medications  megestrol (MEGACE) tablet 80 mg (80 mg Oral Given 10/26/20 0940)  HYDROcodone-acetaminophen (NORCO/VICODIN) 5-325 MG per tablet 1 tablet (1 tablet Oral Given 10/26/20 0821)  ondansetron (ZOFRAN) tablet 4 mg (4 mg Oral Given 10/26/20 0146)    Or  ondansetron (ZOFRAN) injection 4 mg ( Intravenous See Alternative 10/26/20 0146)  Chlorhexidine Gluconate Cloth 2 % PADS 6 each (6 each Topical Given 10/26/20 0816)  amoxicillin-clavulanate (AUGMENTIN) 875-125 MG per tablet 1 tablet (1 tablet Oral Given 10/26/20 0815)  pantoprazole (PROTONIX) EC tablet 40 mg (40 mg Oral Given 10/26/20 0816)  amLODipine (NORVASC) tablet 5 mg (5 mg Oral Given 10/26/20 0815)  ferric gluconate (FERRLECIT) 250 mg in sodium chloride 0.9 % 250 mL IVPB (has no administration in time range)  0.9 %  sodium chloride infusion (0 mL/hr Intravenous Stopped 10/25/20 2332)  penicillin v potassium (VEETID) tablet 500 mg (500 mg Oral Given 10/25/20 2048)  0.9 %  sodium chloride infusion (Manually program via Guardrails IV Fluids) (0 mLs Intravenous Stopped 10/25/20 2333)    ED Course  I have reviewed the triage vital signs and the nursing notes.  Pertinent labs & imaging results that were available during my care of the patient were reviewed by me and considered in my medical decision making (see chart for details).  Clinical Course as of 10/26/20 1042  Sun Oct 25, 2020  2059 Patient is critically anemic.  Likely due to her ongoing dysfunctional uterine bleeding.  Have ordered blood.  Discussed with [facility]  hospitalist Dr. Adrian Blackwater who will evaluate the patient for admission. [MB]    Clinical Course User Index [MB] Terrilee Files, MD   MDM Rules/Calculators/A&P                           This patient complains of vaginal bleeding shortness of breath dental pain; this involves an extensive number of treatment Options and is a complaint that carries with it a high risk of complications and Morbidity. The differential includes symptomatic anemia, ACS, pneumonia, pneumothorax, dysfunctional uterine bleeding, dental infection, dental caries  I ordered, reviewed and interpreted labs, which included CBC with normal white count, hemoglobin critically low, chemistries mildly elevated BUN and creatinine, pregnancy test negative, coags normal I ordered medication oral antibiotics, transfusion of packed red blood cells I ordered imaging studies which included chest x-ray and I independently    visualized and interpreted imaging which showed no acute findings Previous records obtained and reviewed in epic no recent admission I consulted Dr. Adrian Blackwater [facility] hospitalist and discussed lab and imaging findings  Critical Interventions: Transfusion of packed red  blood cells for critically low hemoglobin  After the interventions stated above, I reevaluated the patient and found patient to be hypertensive and otherwise hemodynamically stable.  Reviewed results of work-up with her.  She is in agreement for admission to the hospital for transfusion and continued work-up of her symptoms.  Final Clinical Impression(s) / ED Diagnoses Final diagnoses:  Symptomatic anemia  Dysfunctional uterine bleeding  Pain, dental  Abnormal uterine bleeding    Rx / DC Orders ED Discharge Orders     None        Terrilee Files, MD 10/26/20 1046

## 2020-10-25 NOTE — ED Notes (Signed)
Hospitalist at bedside 

## 2020-10-25 NOTE — ED Triage Notes (Signed)
Vaginal bleeding x 6 months, left lower toothache , shortness of breath

## 2020-10-25 NOTE — H&P (Signed)
History and Physical  Paula Mullins JHE:[id number] DOB: 12/22/1970 DOA: 10/25/2020  Referring physician: Dr Charm Barges, ED physician PCP: Marjie Skiff, NP  Outpatient Specialists:   Patient Coming From: home  Chief Complaint: tooth pain, weakness  HPI: Paula Mullins is a 50 y.o. female G 12 P8038 with 7 vaginal deliveries and 1 cesarean section.  She has a medical history of hypertension, anxiety.  Patient seen for tooth pain that has been going on for 3 months that has been gradually increasing and has become severe.  She has been trying to take Aleve, ibuprofen, naproxen without improvement.  She does not have insurance and is unable to get care.  Additionally, she has been having 1 month of worsening shortness of breath with dyspnea on exertion.  She has been having vaginal bleeding for the past 6 months.  She may have 2 to 3 days of nonbleeding, but will restart.  Bleeding is described as heavy, changing saturated pads every 2-3 hours.  She is passing plum sized clots.  Prior to this, she was having normal periods and occasionally skipping a month.  Emergency Department Course: Hemoglobin 3.8.  Review of Systems:   Pt denies any fevers, chills, nausea, vomiting, diarrhea, constipation, abdominal pain, orthopnea, cough, wheezing, palpitations, headache, vision changes, lightheadedness, dizziness, melena, rectal bleeding.  Review of systems are otherwise negative  Past Medical History:  Diagnosis Date   Anxiety    Pt reports anxiety   Hypertension    Migraine    Sleep apnea    Past Surgical History:  Procedure Laterality Date   CESAREAN SECTION  2011   HERNIA REPAIR  2012   TUBAL LIGATION     Social History:  reports that she has never smoked. She has never used smokeless tobacco. She reports current alcohol use. She reports current drug use. Frequency: 1.00 time per week. Drugs: Cocaine and Marijuana. Patient lives at home  Allergies  Allergen Reactions   Flagyl  [Metronidazole] Swelling    Family History  Problem Relation Age of Onset   Cancer Sister        Ovarian   Cancer Maternal Grandmother        Leukemia   Asthma Mother    Allergies Mother       Prior to Admission medications   Medication Sig Start Date End Date Taking? Authorizing Provider  ibuprofen (ADVIL) 200 MG tablet Take 200 mg by mouth every 6 (six) hours as needed.   Yes [provider]  naproxen (NAPROSYN) 500 MG tablet Take 1 tablet (500 mg total) by mouth 2 (two) times daily as needed for moderate pain. Patient taking differently: Take 1,000 mg by mouth 2 (two) times daily as needed for moderate pain. 05/03/19  Yes Cannady, Jolene T, NP  Cholecalciferol 1.25 MG (50000 UT) TABS Take 1 tablet by mouth once a week. For 8 weeks and then stop.  Return to office for lab draw. Patient not taking: Reported on 10/25/2020 03/01/20   Aura Dials T, NP  Ferrous Sulfate (SLOW FE) 142 (45 Fe) MG TBCR Take 1 tablet by mouth in the morning and at bedtime. Patient not taking: Reported on 10/25/2020 03/01/20   Aura Dials T, NP    Physical Exam: BP (!) 150/105   Pulse (!) 102   Temp 98 F (36.7 C) (Oral)   Resp (!) 21   SpO2 100%   General: Middle-age female. Awake and alert and oriented x3. No acute cardiopulmonary distress.  HEENT: Normocephalic  atraumatic.  Right and left ears normal in appearance.  Pupils equal, round, reactive to light. Extraocular muscles are intact. Sclerae anicteric and noninjected.  Moist mucosal membranes. No mucosal lesions.  Pale mucosal membranes Neck: Neck supple without lymphadenopathy. No carotid bruits. No masses palpated.  Cardiovascular: Regular rate with normal S1-S2 sounds. No murmurs, rubs, gallops auscultated. No JVD.  Respiratory: Good respiratory effort with no wheezes, rales, rhonchi. Lungs clear to auscultation bilaterally.  No accessory muscle use. Abdomen: Soft, nontender, nondistended. Active bowel sounds. No masses or  hepatosplenomegaly  Skin: Nailbeds extremely pale.  No rashes, lesions, or ulcerations.  Dry, warm to touch. 2+ dorsalis pedis and radial pulses. Musculoskeletal: No calf or leg pain. All major joints not erythematous nontender.  No upper or lower joint deformation.  Good ROM.  No contractures  Psychiatric: Intact judgment and insight. Pleasant and cooperative. Neurologic: No focal neurological deficits. Strength is 5/5 and symmetric in upper and lower extremities.  Cranial nerves II through XII are grossly intact.           Labs on Admission: I have personally reviewed following labs and imaging studies  CBC: Recent Labs  Lab 10/25/20 1930  WBC 6.6  NEUTROABS 5.1  HGB 3.8*  HCT 14.8*  MCV 73.3*  PLT 265   Basic Metabolic Panel: Recent Labs  Lab 10/25/20 1930  NA 136  K 4.1  CL 107  CO2 21*  GLUCOSE 138*  BUN 22*  CREATININE 1.38*  CALCIUM 8.6*   GFR: CrCl cannot be calculated (Unknown ideal weight.). Liver Function Tests: No results for input(s): AST, ALT, ALKPHOS, BILITOT, PROT, ALBUMIN in the last 168 hours. No results for input(s): LIPASE, AMYLASE in the last 168 hours. No results for input(s): AMMONIA in the last 168 hours. Coagulation Profile: Recent Labs  Lab 10/25/20 2028  INR 1.1   Cardiac Enzymes: No results for input(s): CKTOTAL, CKMB, CKMBINDEX, TROPONINI in the last 168 hours. BNP (last 3 results) No results for input(s): PROBNP in the last 8760 hours. HbA1C: No results for input(s): HGBA1C in the last 72 hours. CBG: No results for input(s): GLUCAP in the last 168 hours. Lipid Profile: No results for input(s): CHOL, HDL, LDLCALC, TRIG, CHOLHDL, LDLDIRECT in the last 72 hours. Thyroid Function Tests: No results for input(s): TSH, T4TOTAL, FREET4, T3FREE, THYROIDAB in the last 72 hours. Anemia Panel: No results for input(s): VITAMINB12, FOLATE, FERRITIN, TIBC, IRON, RETICCTPCT in the last 72 hours. Urine analysis:    Component Value Date/Time    COLORURINE YELLOW 07/05/2012 0416   APPEARANCEUR Clear 10/15/2014 1029   LABSPEC 1.007 07/05/2012 0416   PHURINE 6.5 07/05/2012 0416   GLUCOSEU Negative 10/15/2014 1029   HGBUR NEGATIVE 07/05/2012 0416   BILIRUBINUR Negative 10/15/2014 1029   KETONESUR NEGATIVE 07/05/2012 0416   PROTEINUR Negative 10/15/2014 1029   PROTEINUR NEGATIVE 07/05/2012 0416   UROBILINOGEN 0.2 07/05/2012 0416   NITRITE Negative 10/15/2014 1029   NITRITE NEGATIVE 07/05/2012 0416   LEUKOCYTESUR Negative 10/15/2014 1029   Sepsis Labs: @LABRCNTIP (procalcitonin:4,lacticidven:4) )No results found for this or any previous visit (from the past 240 hour(s)).   Radiological Exams on Admission: DG Chest Port 1 View  Result Date: 10/25/2020 CLINICAL DATA:  Shortness of breath. EXAM: PORTABLE CHEST 1 VIEW COMPARISON:  Chest radiograph dated 11/13/2016. FINDINGS: No focal consolidation, pleural effusion, or pneumothorax. The cardiac silhouette is within limits. No acute osseous pathology. IMPRESSION: No active disease. Electronically Signed   By: 13/11/2016 M.D.   On: 10/25/2020 20:42  EKG: Independently reviewed.  Sinus rhythm with short PR interval.  Left atrial enlargement.  RVR in V1 and V2.  No acute ST changes.  Assessment/Plan: Active Problems:   Essential hypertension   Acute on chronic blood loss anemia   Abnormal uterine bleeding    This patient was discussed with the ED physician, including pertinent vitals, physical exam findings, labs, and imaging.  We also discussed care given by the ED provider.  Acute on chronic blood loss anemia with AUB Observation Will transfuse a total of 4 units packed red blood cells +2 of FFP as the patient likely has diminished clotting factors and transfusing 4 units will further diminish the patient's clotting factors. Start Megace 80 mg 3 times daily Check TSH, hemoglobin A1c Pelvic ultrasound with transvaginal to evaluate for fibroids, endometrial  abnormalities If the patient's bleeding does not improve with Megace, may need Premarin IV GYN not formally consulted at this time as I work in [employer] health care in addition to my hospitalist role. I will review results tomorrow and discuss ultrasound with hospitalist team and arrange for follow-up Currently, the patient does not have health insurance.  We will consult transition of care team to assist.  The patient may need charity care Hypertension Will watch blood pressure for now. Will reassess after improvement of volume status. Dental problems Possible infection Treat with penicillin Vicodin, ibuprofen for pain  DVT prophylaxis: SCDs Consultants: none Code Status: Full Family Communication: husband present  Disposition Plan: Patient should be able to return home following improvement   Levie Heritage, DO

## 2020-10-26 ENCOUNTER — Other Ambulatory Visit: Payer: Self-pay

## 2020-10-26 ENCOUNTER — Observation Stay (HOSPITAL_COMMUNITY): Payer: Self-pay

## 2020-10-26 DIAGNOSIS — N939 Abnormal uterine and vaginal bleeding, unspecified: Secondary | ICD-10-CM | POA: Diagnosis not present

## 2020-10-26 DIAGNOSIS — R7301 Impaired fasting glucose: Secondary | ICD-10-CM

## 2020-10-26 DIAGNOSIS — D62 Acute posthemorrhagic anemia: Secondary | ICD-10-CM | POA: Diagnosis not present

## 2020-10-26 DIAGNOSIS — R0609 Other forms of dyspnea: Secondary | ICD-10-CM

## 2020-10-26 DIAGNOSIS — D509 Iron deficiency anemia, unspecified: Secondary | ICD-10-CM

## 2020-10-26 DIAGNOSIS — N1831 Chronic kidney disease, stage 3a: Secondary | ICD-10-CM

## 2020-10-26 DIAGNOSIS — N183 Chronic kidney disease, stage 3 unspecified: Secondary | ICD-10-CM

## 2020-10-26 LAB — PREPARE FRESH FROZEN PLASMA: Unit division: 0

## 2020-10-26 LAB — CBC
HCT: 24.7 % — ABNORMAL LOW (ref 36.0–46.0)
Hemoglobin: 7.3 g/dL — ABNORMAL LOW (ref 12.0–15.0)
MCH: 22.7 pg — ABNORMAL LOW (ref 26.0–34.0)
MCHC: 29.6 g/dL — ABNORMAL LOW (ref 30.0–36.0)
MCV: 76.9 fL — ABNORMAL LOW (ref 80.0–100.0)
Platelets: 214 10*3/uL (ref 150–400)
RBC: 3.21 MIL/uL — ABNORMAL LOW (ref 3.87–5.11)
RDW: 18.7 % — ABNORMAL HIGH (ref 11.5–15.5)
WBC: 4.6 10*3/uL (ref 4.0–10.5)
nRBC: 0.4 % — ABNORMAL HIGH (ref 0.0–0.2)

## 2020-10-26 LAB — RAPID URINE DRUG SCREEN, HOSP PERFORMED
Amphetamines: NOT DETECTED
Barbiturates: NOT DETECTED
Benzodiazepines: NOT DETECTED
Cocaine: POSITIVE — AB
Opiates: NOT DETECTED
Tetrahydrocannabinol: NOT DETECTED

## 2020-10-26 LAB — HEMOGLOBIN AND HEMATOCRIT, BLOOD
HCT: 17.2 % — ABNORMAL LOW (ref 36.0–46.0)
HCT: 31.3 % — ABNORMAL LOW (ref 36.0–46.0)
Hemoglobin: 4.8 g/dL — CL (ref 12.0–15.0)
Hemoglobin: 9.4 g/dL — ABNORMAL LOW (ref 12.0–15.0)

## 2020-10-26 LAB — ECHOCARDIOGRAM COMPLETE
AR max vel: 2.28 cm2
AV Area VTI: 2.39 cm2
AV Area mean vel: 2.43 cm2
AV Mean grad: 2 mmHg
AV Peak grad: 5.6 mmHg
Ao pk vel: 1.18 m/s
Area-P 1/2: 5.13 cm2
Height: 64 in
MV VTI: 1.76 cm2
S' Lateral: 3.1 cm
Weight: 2144.63 oz

## 2020-10-26 LAB — TSH: TSH: 3.402 u[IU]/mL (ref 0.350–4.500)

## 2020-10-26 LAB — HIV ANTIBODY (ROUTINE TESTING W REFLEX): HIV Screen 4th Generation wRfx: NONREACTIVE

## 2020-10-26 LAB — VITAMIN D 25 HYDROXY (VIT D DEFICIENCY, FRACTURES): Vit D, 25-Hydroxy: 13.59 ng/mL — ABNORMAL LOW (ref 30–100)

## 2020-10-26 LAB — VITAMIN B12: Vitamin B-12: 238 pg/mL (ref 180–914)

## 2020-10-26 LAB — RETICULOCYTES
Immature Retic Fract: 30.2 % — ABNORMAL HIGH (ref 2.3–15.9)
RBC.: 2.07 MIL/uL — ABNORMAL LOW (ref 3.87–5.11)
Retic Count, Absolute: 59.2 10*3/uL (ref 19.0–186.0)
Retic Ct Pct: 2.9 % (ref 0.4–3.1)

## 2020-10-26 LAB — IRON AND TIBC
Iron: 12 ug/dL — ABNORMAL LOW (ref 28–170)
Saturation Ratios: 2 % — ABNORMAL LOW (ref 10.4–31.8)
TIBC: 500 ug/dL — ABNORMAL HIGH (ref 250–450)
UIBC: 488 ug/dL

## 2020-10-26 LAB — FOLATE: Folate: 10.9 ng/mL (ref >5.9)

## 2020-10-26 LAB — BPAM FFP
Blood Product Expiration Date: 202210242115
Blood Product Expiration Date: 202210242119
ISSUE DATE / TIME: 202210232205
ISSUE DATE / TIME: 202210232256
Unit Type and Rh: 5100
Unit Type and Rh: 5100

## 2020-10-26 LAB — TROPONIN I (HIGH SENSITIVITY)
Troponin I (High Sensitivity): 19 ng/L — ABNORMAL HIGH (ref <18)
Troponin I (High Sensitivity): 22 ng/L — ABNORMAL HIGH (ref <18)

## 2020-10-26 LAB — HEMOGLOBIN A1C
Hgb A1c MFr Bld: 5.4 % (ref 4.8–5.6)
Mean Plasma Glucose: 108.28 mg/dL

## 2020-10-26 LAB — FERRITIN: Ferritin: 3 ng/mL — ABNORMAL LOW (ref 11–307)

## 2020-10-26 LAB — MRSA NEXT GEN BY PCR, NASAL: MRSA by PCR Next Gen: NOT DETECTED

## 2020-10-26 MED ORDER — CHLORHEXIDINE GLUCONATE CLOTH 2 % EX PADS
6.0000 | MEDICATED_PAD | Freq: Every day | CUTANEOUS | Status: DC
  Administered 2020-10-26 – 2020-10-27 (×3): 6 via TOPICAL

## 2020-10-26 MED ORDER — AMLODIPINE BESYLATE 5 MG PO TABS
5.0000 mg | ORAL_TABLET | Freq: Every day | ORAL | Status: DC
  Administered 2020-10-26: 5 mg via ORAL
  Filled 2020-10-26 (×2): qty 1

## 2020-10-26 MED ORDER — AMOXICILLIN-POT CLAVULANATE 875-125 MG PO TABS
1.0000 | ORAL_TABLET | Freq: Two times a day (BID) | ORAL | Status: DC
  Administered 2020-10-26 – 2020-10-27 (×3): 1 via ORAL
  Filled 2020-10-26 (×3): qty 1

## 2020-10-26 MED ORDER — VITAMIN D (ERGOCALCIFEROL) 1.25 MG (50000 UNIT) PO CAPS
50000.0000 [IU] | ORAL_CAPSULE | ORAL | Status: DC
  Administered 2020-10-26: 50000 [IU] via ORAL
  Filled 2020-10-26: qty 1

## 2020-10-26 MED ORDER — ESTROGENS CONJUGATED 25 MG IJ SOLR
25.0000 mg | Freq: Four times a day (QID) | INTRAMUSCULAR | Status: DC
  Administered 2020-10-26 (×2): 25 mg via INTRAVENOUS
  Filled 2020-10-26 (×4): qty 25

## 2020-10-26 MED ORDER — ESTROGENS CONJUGATED 25 MG IJ SOLR
25.0000 mg | Freq: Once | INTRAMUSCULAR | Status: DC
  Filled 2020-10-26: qty 25

## 2020-10-26 MED ORDER — SODIUM CHLORIDE 0.9 % IV SOLN
250.0000 mg | Freq: Once | INTRAVENOUS | Status: AC
  Administered 2020-10-26: 250 mg via INTRAVENOUS
  Filled 2020-10-26: qty 20

## 2020-10-26 MED ORDER — CYANOCOBALAMIN 1000 MCG/ML IJ SOLN
1000.0000 ug | Freq: Once | INTRAMUSCULAR | Status: AC
  Administered 2020-10-26: 1000 ug via INTRAMUSCULAR
  Filled 2020-10-26: qty 1

## 2020-10-26 MED ORDER — HYDRALAZINE HCL 20 MG/ML IJ SOLN
10.0000 mg | Freq: Once | INTRAMUSCULAR | Status: AC
  Administered 2020-10-26: 10 mg via INTRAVENOUS
  Filled 2020-10-26: qty 1

## 2020-10-26 MED ORDER — MEGESTROL ACETATE 40 MG PO TABS
120.0000 mg | ORAL_TABLET | Freq: Every day | ORAL | Status: DC
  Administered 2020-10-26 – 2020-10-27 (×2): 120 mg via ORAL
  Filled 2020-10-26 (×3): qty 3

## 2020-10-26 MED ORDER — PANTOPRAZOLE SODIUM 40 MG PO TBEC
40.0000 mg | DELAYED_RELEASE_TABLET | Freq: Two times a day (BID) | ORAL | Status: DC
  Administered 2020-10-26 – 2020-10-27 (×3): 40 mg via ORAL
  Filled 2020-10-26 (×3): qty 1

## 2020-10-26 NOTE — Progress Notes (Signed)
*  PRELIMINARY RESULTS* Echocardiogram 2D Echocardiogram has been performed.  Paula Mullins 10/26/2020, 11:23 AM

## 2020-10-26 NOTE — Plan of Care (Signed)

## 2020-10-26 NOTE — Progress Notes (Signed)
Report called and given to RN on dept. 300. Pt to be transported via WC to room 325.

## 2020-10-26 NOTE — Progress Notes (Signed)
Results of pelvic US reviewed with Dr. Adrian Blackwater -107mm endometrial thickening -elongated cystic structure adjacent to Left Ovary -Dr. Adrian Blackwater will contact GYN, Dr. Charlotta Newton to come see patient and arrange followup  -since vaginal bleeding has not improved (continued mod to large volume), IV premarin ordered   Elevated troponin--likely demand ischemia;  19>>22 -repeat Hgb at 1600 -follow up echo results  Vitamin D deficiency--start ergocalciferol -25 vitamin D level 13.59  Cocaine Use -UDS positive cocaine  3 daughters present in room and updated on medical issues and results and updated on plan.   Catarina Hartshorn, DO

## 2020-10-26 NOTE — Consult Note (Addendum)
OBSTETRICS AND GYNECOLOGY ATTENDING CONSULT NOTE  Consult Date: 10/26/2020  Reason for Consult: Abnormal uterine bleeding Consulting Provider: Dr. Charlotta Newton    Assessment/Plan: -Change to megace 120mg  daily -Premarin 25 IVq6hr x 24hr -Anticipate improvement within the next 8-12 hours.  While bleeding may still be present, it should be lighter than current status -If bleeding has not improved may need to consider D&C -Anemia management per primary care team    Appreciate care of Paula Mullins by her primary team Dr. Arbutus Leas.   Will continue to follow with daily rounding Thank you for involving [place] in the care of this patient.  Total consultation time including face-to-face time with patient (>50% of time), reviewing chart and documentation: 35 minutes  Myna Hidalgo, DO Attending Obstetrician & Gynecologist, [facility] for [facility], [facility]   History of Present Illness: Paula Mullins is an 50 y.o. female [id number] who was admitted for symptomatic anemia.  Pt states she has had heavy bleeding for the past 6 mos.  She typically only has about one week out of the month where she is not bleeding.  Notes soaking through pads with frequent changes in under 2hours and passage of plum-sized clots.  Today she notes that the bleeding is better when she is not moving, but when she voids she still feels like it is "pouring out."  Also notes that the blood clots are much smaller than previously.  She has delayed care due to lack of insurance.  Upon review of her chart last seen by Dr. Emelda Fear for her pregnancy in 2011.  Notes a mild headache, no dizziness.  Denies pelvic or abodminal pain.  Denies any urinary or GI complaints.  Prior chest pain and SOB has improved.  Pertinent OB/GYN History: OBhx: [id number]- Prenatal records reviewed from 2011- C-section for breech presentation, otherwise prior NSVDs Contraception: tubal ligation  Patient Active  Problem List   Diagnosis Date Noted   CKD (chronic kidney disease) stage 3, GFR 30-59 ml/min (HCC) 10/26/2020   Acute blood loss anemia 10/26/2020   Acute on chronic blood loss anemia 10/25/2020   Abnormal uterine bleeding 10/25/2020   Vitamin D deficiency 03/01/2020   Essential hypertension 08/10/2018   Cocaine abuse (HCC) 07/26/2016   Bipolar I disorder (HCC) 07/26/2016   High risk sexual behavior 10/20/2014   IFG (impaired fasting glucose) 10/15/2014   Iron deficiency anemia 10/15/2014   Migraine 10/15/2014    Past Medical History:  Diagnosis Date   Anxiety    Pt reports anxiety   Hypertension    Migraine    Sleep apnea     Past Surgical History:  Procedure Laterality Date   CESAREAN SECTION  2011   HERNIA REPAIR  2012   TUBAL LIGATION      Family History  Problem Relation Age of Onset   Cancer Sister        Ovarian   Cancer Maternal Grandmother        Leukemia   Asthma Mother    Allergies Mother     Social History:  reports that she has never smoked. She has never used smokeless tobacco. She reports current alcohol use. She reports current drug use. Frequency: 1.00 time per week. Drugs: Cocaine and Marijuana.  Allergies:  Allergies  Allergen Reactions   Flagyl [Metronidazole] Swelling    Medications: I have reviewed the patient's current medications.  Review of Systems: Pertinent items are noted in HPI.  ROS otherwise negative  Focused Physical  Examination: BP (!) 196/95   Pulse 77   Temp 97.7 F (36.5 C) (Oral)   Resp 13   Ht 5\' 4"  (1.626 m)   Wt 60.8 kg   SpO2 100%   BMI 23.01 kg/m  CONSTITUTIONAL: Well-developed, pale-appearing female in no acute distress.  HENT:  Normocephalic, atraumatic, External right and left ear normal. Oropharynx is clear and moist EYES: Conjunctivae and EOM are normal. No scleral icterus.  SKIN: Skin is warm and dry. No rash noted. Not diaphoretic. No erythema. No pallor. NEUROLGIC: Alert and oriented to person,  place, and time. No cranial nerve deficit noted. PSYCHIATRIC: Normal mood and affect. Normal behavior. CARDIOVASCULAR: Normal heart rate noted, regular rhythm RESPIRATORY: Clear to auscultation bilaterally. Effort and breath sounds normal, no problems with respiration noted. ABDOMEN: Soft, normal bowel sounds, no distention noted.  No tenderness, rebound or guarding. Uterus below umbilicus PELVIC: deferred MUSCULOSKELETAL: Normal range of motion. No tenderness.  No cyanosis, clubbing, or edema  Labs and Imaging: Results for orders placed or performed during the hospital encounter of 10/25/20 (from the past 72 hour(s))  Basic metabolic panel     Status: Abnormal   Collection Time: 10/25/20  7:30 PM  Result Value Ref Range   Sodium 136 135 - 145 mmol/L   Potassium 4.1 3.5 - 5.1 mmol/L   Chloride 107 98 - 111 mmol/L   CO2 21 (L) 22 - 32 mmol/L   Glucose, Bld 138 (H) 70 - 99 mg/dL    Comment: Glucose reference range applies only to samples taken after fasting for at least 8 hours.   BUN 22 (H) 6 - 20 mg/dL   Creatinine, Ser 10/27/20 (H) 0.44 - 1.00 mg/dL   Calcium 8.6 (L) 8.9 - 10.3 mg/dL   GFR, Estimated 46 (L) >60 mL/min    Comment: (NOTE) Calculated using the CKD-EPI Creatinine Equation (2021)    Anion gap 8 5 - 15    Comment: Performed at [facility], [address] [place], [place]  CBC with Differential     Status: Abnormal   Collection Time: 10/25/20  7:30 PM  Result Value Ref Range   WBC 6.6 4.0 - 10.5 K/uL   RBC 2.02 (L) 3.87 - 5.11 MIL/uL   Hemoglobin 3.8 (LL) 12.0 - 15.0 g/dL    Comment: Reticulocyte Hemoglobin testing may be clinically indicated, consider ordering this additional test 10/27/20 THIS CRITICAL RESULT HAS VERIFIED AND BEEN CALLED TO [facility],K BY BOBBIE MATTHEWS ON 10 23 2022 AT 2013, AND HAS BEEN READ BACK.     HCT 14.8 (L) 36.0 - 46.0 %   MCV 73.3 (L) 80.0 - 100.0 fL   MCH 18.8 (L) 26.0 - 34.0 pg   MCHC 25.7 (L) 30.0 - 36.0 g/dL   RDW 2014 (H)  66.0 - 15.5 %   Platelets 265 150 - 400 K/uL   nRBC 0.5 (H) 0.0 - 0.2 %   Neutrophils Relative % 76 %   Neutro Abs 5.1 1.7 - 7.7 K/uL   Lymphocytes Relative 11 %   Lymphs Abs 0.7 0.7 - 4.0 K/uL   Monocytes Relative 10 %   Monocytes Absolute 0.7 0.1 - 1.0 K/uL   Eosinophils Relative 1 %   Eosinophils Absolute 0.1 0.0 - 0.5 K/uL   Basophils Relative 1 %   Basophils Absolute 0.0 0.0 - 0.1 K/uL   Immature Granulocytes 1 %   Abs Immature Granulocytes 0.07 0.00 - 0.07 K/uL    Comment: Performed at [facility], [address], [place], [place] [address]  hCG, serum, qualitative     Status: None   Collection Time: 10/25/20  7:30 PM  Result Value Ref Range   Preg, Serum NEGATIVE NEGATIVE    Comment:        THE SENSITIVITY OF THIS METHODOLOGY IS >10 mIU/mL. Performed at [facility], [address] [place], [place] [address]   Vitamin B12     Status: None   Collection Time: 10/25/20  7:30 PM  Result Value Ref Range   Vitamin B-12 238 180 - 914 pg/mL    Comment: (NOTE) This assay is not validated for testing neonatal or myeloproliferative syndrome specimens for Vitamin B12 levels. Performed at [facility], [address], [place], [place] [address]   Iron and TIBC     Status: Abnormal   Collection Time: 10/25/20  7:30 PM  Result Value Ref Range   Iron 12 (L) 28 - 170 ug/dL   TIBC 751 (H) 025 - 852 ug/dL   Saturation Ratios 2 (L) 10.4 - 31.8 %   UIBC 488 ug/dL    Comment: Performed at [facility], [address], [place], [place] [address]  Ferritin     Status: Abnormal   Collection Time: 10/25/20  7:30 PM  Result Value Ref Range   Ferritin 3 (L) 11 - 307 ng/mL    Comment: Performed at [facility], [address] [place], [place] [address]  Reticulocytes     Status: Abnormal   Collection Time: 10/25/20  7:30 PM  Result Value Ref Range   Retic Ct Pct 2.9 0.4 - 3.1 %   RBC. 2.07 (L) 3.87 - 5.11 MIL/uL   Retic Count, Absolute 59.2 19.0 - 186.0 K/uL   Immature Retic Fract 30.2  (H) 2.3 - 15.9 %    Comment: Performed at [facility], [address], [place], [place] [address]  Type and screen [facility]     Status: None (Preliminary result)   Collection Time: 10/25/20  8:28 PM  Result Value Ref Range   ABO/RH(D) O POS    Antibody Screen NEG    Sample Expiration 10/28/2020,2359    Unit Number [id number]    Blood Component Type RED CELLS,LR    Unit division 00    Status of Unit ISSUED    Transfusion Status OK TO TRANSFUSE    Crossmatch Result Compatible    Unit Number [id number]    Blood Component Type RED CELLS,LR    Unit division 00    Status of Unit ISSUED    Transfusion Status OK TO TRANSFUSE    Crossmatch Result Compatible    Unit Number [id number]    Blood Component Type RED CELLS,LR    Unit division 00    Status of Unit ISSUED,FINAL    Transfusion Status OK TO TRANSFUSE    Crossmatch Result      Compatible Performed at [facility], [address], [place], [place] [address]    Unit Number [id number]    Blood Component Type RED CELLS,LR    Unit division 00    Status of Unit ISSUED    Transfusion Status OK TO TRANSFUSE    Crossmatch Result Compatible   Protime-INR     Status: None   Collection Time: 10/25/20  8:28 PM  Result Value Ref Range   Prothrombin Time 14.5 11.4 - 15.2 seconds   INR 1.1 0.8 - 1.2    Comment: (NOTE) INR goal varies based on device and disease  states. Performed at [facility], [address], [place], [place] [address]   Prepare RBC (crossmatch)     Status: None   Collection Time: 10/25/20  8:30 PM  Result Value Ref Range   Order Confirmation      ORDER PROCESSED BY BLOOD BANK Performed at [facility], [address], [place], [place] [address]   Resp Panel by RT-PCR (Flu A&B, Covid) Nasopharyngeal Swab     Status: None   Collection Time: 10/25/20  8:46 PM   Specimen: Nasopharyngeal Swab; Nasopharyngeal(NP) swabs in vial transport medium  Result Value Ref Range   SARS Coronavirus 2 by RT PCR  NEGATIVE NEGATIVE    Comment: (NOTE) SARS-CoV-2 target nucleic acids are NOT DETECTED.  The SARS-CoV-2 RNA is generally detectable in upper respiratory specimens during the acute phase of infection. The lowest concentration of SARS-CoV-2 viral copies this assay can detect is 138 copies/mL. A negative result does not preclude SARS-Cov-2 infection and should not be used as the sole basis for treatment or other patient management decisions. A negative result may occur with  improper specimen collection/handling, submission of specimen other than nasopharyngeal swab, presence of viral mutation(s) within the areas targeted by this assay, and inadequate number of viral copies(<138 copies/mL). A negative result must be combined with clinical observations, patient history, and epidemiological information. The expected result is Negative.  Fact Sheet for Patients:  BloggerCourse.com  Fact Sheet for Healthcare Providers:  SeriousBroker.it  This test is no t yet approved or cleared by the [place] FDA and  has been authorized for detection and/or diagnosis of SARS-CoV-2 by FDA under an Emergency Use Authorization (EUA). This EUA will remain  in effect (meaning this test can be used) for the duration of the COVID-19 declaration under Section 564(b)(1) of the Act, 21 U.S.C.section 360bbb-3(b)(1), unless the authorization is terminated  or revoked sooner.       Influenza A by PCR NEGATIVE NEGATIVE   Influenza B by PCR NEGATIVE NEGATIVE    Comment: (NOTE) The Xpert Xpress SARS-CoV-2/FLU/RSV plus assay is intended as an aid in the diagnosis of influenza from Nasopharyngeal swab specimens and should not be used as a sole basis for treatment. Nasal washings and aspirates are unacceptable for Xpert Xpress SARS-CoV-2/FLU/RSV testing.  Fact Sheet for Patients: BloggerCourse.com  Fact Sheet for Healthcare  Providers: SeriousBroker.it  This test is not yet approved or cleared by the [place] FDA and has been authorized for detection and/or diagnosis of SARS-CoV-2 by FDA under an Emergency Use Authorization (EUA). This EUA will remain in effect (meaning this test can be used) for the duration of the COVID-19 declaration under Section 564(b)(1) of the Act, 21 U.S.C. section 360bbb-3(b)(1), unless the authorization is terminated or revoked.  Performed at [facility], [address] [place], [place] [address]   Prepare RBC (crossmatch)     Status: None   Collection Time: 10/25/20  8:51 PM  Result Value Ref Range   Order Confirmation      ORDER PROCESSED BY BLOOD BANK Performed at [facility], [address], [place], [place] [address]   Prepare fresh frozen plasma     Status: None   Collection Time: 10/25/20  9:03 PM  Result Value Ref Range   Unit Number [id number]    Blood Component Type FP24 PHR THW    Unit division 00    Status of Unit ISSUED,FINAL    Transfusion Status OK TO TRANSFUSE    Unit Number [id number]  Blood Component Type FP24 PHR THW    Unit division B0    Status of Unit ISSUED,FINAL    Transfusion Status      OK TO TRANSFUSE Performed at [facility], [address] [place] [address], [place], [place] [address]   Urinalysis, Routine w reflex microscopic Urine, Clean Catch     Status: Abnormal   Collection Time: 10/25/20  9:29 PM  Result Value Ref Range   Color, Urine STRAW (A) YELLOW   APPearance CLEAR CLEAR   Specific Gravity, Urine 1.004 (L) 1.005 - 1.030   pH 6.0 5.0 - 8.0   Glucose, UA NEGATIVE NEGATIVE mg/dL   Hgb urine dipstick LARGE (A) NEGATIVE   Bilirubin Urine NEGATIVE NEGATIVE   Ketones, ur NEGATIVE NEGATIVE mg/dL   Protein, ur NEGATIVE NEGATIVE mg/dL   Nitrite NEGATIVE NEGATIVE   Leukocytes,Ua NEGATIVE NEGATIVE   RBC / HPF 21-50 0 - 5 RBC/hpf   WBC, UA 6-10 0 - 5 WBC/hpf   Bacteria, UA RARE (A) NONE SEEN   Squamous  Epithelial / LPF 0-5 0 - 5   Mucus PRESENT    Uric Acid Crys, UA PRESENT     Comment: Performed at [facility], [address] [place], [place] [address]  Hemoglobin and hematocrit, blood     Status: Abnormal   Collection Time: 10/26/20 12:25 AM  Result Value Ref Range   Hemoglobin 4.8 (LL) 12.0 - 15.0 g/dL    Comment: CRITICAL RESULT CALLED TO, READ BACK BY AND VERIFIED WITH: SPENCE,H@0032  BY MATTHEWS,B 10.24.22 POST TRANSFUSION SPECIMEN    HCT 17.2 (L) 36.0 - 46.0 %    Comment: Performed at [facility], [address] [place], [place] [address]  MRSA Next Gen by PCR, Nasal     Status: None   Collection Time: 10/26/20  2:00 AM   Specimen: Nasal Mucosa; Nasal Swab  Result Value Ref Range   MRSA by PCR Next Gen NOT DETECTED NOT DETECTED    Comment: (NOTE) The GeneXpert MRSA Assay (FDA approved for NASAL specimens only), is one component of a comprehensive MRSA colonization surveillance program. It is not intended to diagnose MRSA infection nor to guide or monitor treatment for MRSA infections. Test performance is not FDA approved in patients less than 47 years old. Performed at [facility], [address], [place], [place] [address]   HIV Antibody (routine testing w rflx)     Status: None   Collection Time: 10/26/20  4:26 AM  Result Value Ref Range   HIV Screen 4th Generation wRfx Non Reactive Non Reactive    Comment: Performed at [facility], [address], [place], [place] [address]  TSH     Status: None   Collection Time: 10/26/20  4:26 AM  Result Value Ref Range   TSH 3.402 0.350 - 4.500 uIU/mL    Comment: Performed by a 3rd Generation assay with a functional sensitivity of <=0.01 uIU/mL. Performed at [facility], [address] [place], [place] [address]   Hemoglobin A1c     Status: None   Collection Time: 10/26/20  4:26 AM  Result Value Ref Range   Hgb A1c MFr Bld 5.4 4.8 - 5.6 %    Comment: (NOTE) Pre diabetes:          5.7%-6.4%  Diabetes:               >6.4%  Glycemic control for   <7.0% adults with diabetes    Mean Plasma Glucose 108.28 mg/dL  Comment: Performed at [facility], [address] [place], [place] [address]  CBC     Status: Abnormal   Collection Time: 10/26/20  4:26 AM  Result Value Ref Range   WBC 4.6 4.0 - 10.5 K/uL   RBC 3.21 (L) 3.87 - 5.11 MIL/uL   Hemoglobin 7.3 (L) 12.0 - 15.0 g/dL    Comment: POST TRANSFUSION SPECIMEN Reticulocyte Hemoglobin testing may be clinically indicated, consider ordering this additional test [id number]    HCT 24.7 (L) 36.0 - 46.0 %   MCV 76.9 (L) 80.0 - 100.0 fL   MCH 22.7 (L) 26.0 - 34.0 pg   MCHC 29.6 (L) 30.0 - 36.0 g/dL   RDW 95.6 (H) 21.3 - 08.6 %   Platelets 214 150 - 400 K/uL   nRBC 0.4 (H) 0.0 - 0.2 %    Comment: Performed at [facility], [address] [place], [place] [address]  Folate     Status: None   Collection Time: 10/26/20  4:26 AM  Result Value Ref Range   Folate 10.9 >5.9 ng/mL    Comment: Performed at [facility], [address], [place], [place] [address]  VITAMIN D 25 Hydroxy (Vit-D Deficiency, Fractures)     Status: Abnormal   Collection Time: 10/26/20  4:26 AM  Result Value Ref Range   Vit D, 25-Hydroxy 13.59 (L) 30 - 100 ng/mL    Comment: (NOTE) Vitamin D deficiency has been defined by the Institute of Medicine  and an Endocrine Society practice guideline as a level of serum 25-OH  vitamin D less than 20 ng/mL (1,2). The Endocrine Society went on to  further define vitamin D insufficiency as a level between 21 and 29  ng/mL (2).  1. IOM (Institute of Medicine). 2010. Dietary reference intakes for  calcium and D. [place]: [employer]. 2. Holick MF, Binkley [place], Bischoff-Ferrari HA, et al. Evaluation,  treatment, and prevention of vitamin D deficiency: an Endocrine  Society clinical practice guideline, JCEM. 2011 Jul; 96(7): 1911-30.  Performed at [facility], [address] [place], [place] [address]    Troponin I (High Sensitivity)     Status: Abnormal   Collection Time: 10/26/20  4:26 AM  Result Value Ref Range   Troponin I (High Sensitivity) 19 (H) <18 ng/L    Comment: (NOTE) Elevated high sensitivity troponin I (hsTnI) values and significant  changes across serial measurements may suggest ACS but many other  chronic and acute conditions are known to elevate hsTnI results.  Refer to the "Links" section for chest pain algorithms and additional  guidance. Performed at [facility], [address], [place], [place] [address]   Urine rapid drug screen (hosp performed)     Status: Abnormal   Collection Time: 10/26/20  8:12 AM  Result Value Ref Range   Opiates NONE DETECTED NONE DETECTED   Cocaine POSITIVE (A) NONE DETECTED   Benzodiazepines NONE DETECTED NONE DETECTED   Amphetamines NONE DETECTED NONE DETECTED   Tetrahydrocannabinol NONE DETECTED NONE DETECTED   Barbiturates NONE DETECTED NONE DETECTED    Comment: (NOTE) DRUG SCREEN FOR MEDICAL PURPOSES ONLY.  IF CONFIRMATION IS NEEDED FOR ANY PURPOSE, NOTIFY LAB WITHIN 5 DAYS.  LOWEST DETECTABLE LIMITS FOR URINE DRUG SCREEN Drug Class                     Cutoff (ng/mL) Amphetamine and metabolites    1000 Barbiturate and metabolites    200 Benzodiazepine  200 Tricyclics and metabolites     300 Opiates and metabolites        300 Cocaine and metabolites        300 THC                            50 Performed at [facility], [address], [place], [place] [address]   Troponin I (High Sensitivity)     Status: Abnormal   Collection Time: 10/26/20  9:37 AM  Result Value Ref Range   Troponin I (High Sensitivity) 22 (H) <18 ng/L    Comment: (NOTE) Elevated high sensitivity troponin I (hsTnI) values and significant  changes across serial measurements may suggest ACS but many other  chronic and acute conditions are known to elevate hsTnI results.  Refer to the "Links" section for chest pain algorithms and  additional  guidance. Performed at [facility], [address], [place], [place] [address]     DG Chest Port 1 View  Result Date: 10/25/2020 CLINICAL DATA:  Shortness of breath. EXAM: PORTABLE CHEST 1 VIEW COMPARISON:  Chest radiograph dated 11/13/2016. FINDINGS: No focal consolidation, pleural effusion, or pneumothorax. The cardiac silhouette is within limits. No acute osseous pathology. IMPRESSION: No active disease. Electronically Signed   By: Elgie Collard M.D.   On: 10/25/2020 20:42   US PELVIC COMPLETE WITH TRANSVAGINAL FINDINGS: Uterus Measurements: 10.6 x 6.4 x 9.1 cm = volume: 323 mL. No fibroids or other mass visualized. Multiple nabothian cysts noted at the level of the cervix. Endometrium Thickness: 20 mm. Heterogeneous appearance of the endometrium. No well-defined mass.   Right ovary Measurements: 2.7 x 1.8 x 2.5 cm = volume: 6 mL. Normal appearance/no adnexal mass.   Left ovary Measurements: 3.4 x 2.0 x 3.7 cm = volume: 13 mL. Elongated cystic area appears partially within the left ovary measuring 3.8 x 1.0 x 2.6 cm. No solid or nodular component. No internal vascularity.

## 2020-10-26 NOTE — Progress Notes (Addendum)
PROGRESS NOTE  Paula Mullins IOX:[id number] DOB: 10/26/1970 DOA: 10/25/2020 PCP: Marjie Skiff, NP  Brief History:  50 year old female with a history of hypertension and anxiety presenting with multiple complaints.  Patient has been having left lower dental pain for approximately 3 to 4 months.  She has been taking NSAIDs on a daily basis for her pain and discomfort.  She denies any purulent drainage but states that it hurts to chew on the left side.  She denies any fevers, chills, headache, neck pain.  The patient has been having some dyspnea on exertion and epigastric and substernal chest discomfort for the past 3 to 4 months.  She states that she has been having large-volume vaginal bleeding on a daily basis for the past 6 months.  She has only gone 1 week without any vaginal bleeding.  She denies any over-the-counter medications or illicit drug use.  She denies any coughing, hemoptysis, hematemesis, hematochezia, melena.  There is no dysuria or hematuria.  She denies any frank abdominal pain.  She states that she has not been taking any prescription medications or over-the-counter medications other than the NSAIDs. In the emergency department, the patient had low-grade temperature 9 9.2 F.  She was hemodynamically stable with oxygen saturation 99% on room air.  BMP showed a sodium 136, potassium 4.1, serum creatinine 1.3.  WBC 6.6, hemoglobin 3.8, platelets 265,000.  UA showed 6-10 WBC.  TSH 3.402.  EKG shows sinus rhythm with no ST/T wave changes.  Assessment/Plan: Acute on chronic blood loss anemia/abnormal uterine bleeding -Continue Megace 80 mg 3 times daily -Pelvic ultrasound -Transfused 4 units PRBC, 2 units FFP -anemia panel -she continues to have mod to large volume vaginal bleeding -discuss with GYN after pelvic ultrasound results  Dental pain/dental care -Start amox/clav -will need outpatient dental followup  Iron Deficiency anemia due to chronic blood loss -iron  saturation 2 -ferritin 3 -folate 10.9 -B12--238 -ferric gluconate x 1  Chest Pain -check trops -Echo -personally reviewed CXR--no infiltrates or edema -personally reviewed EKG--sinus, no STT changes  CKD stage 3a -baseline creatinine 1.2-1.3  Essential Hypertension -start amlodipine  Impaired Glucose Tolerance -check A1C -02/28/20 A1C--6.0    Status is: Observation  The patient will require care spanning > 2 midnights and should be moved to inpatient because: continued mod to large volume vaginal bleeding.       Family Communication:   no Family at bedside  Consultants:  GYN  Code Status:  FULL / DNR  DVT Prophylaxis:  [place] Heparin / [place] Lovenox   Procedures: As Listed in Progress Note Above  Antibiotics: Amox/clav 10/24>>      Subjective: Patient continues to have some chest discomfort, worse with lying supine.  Sob a little better, but continues to have some dyspnea on exertion.  Denies f/c, n/v/d, abd pain, dysuria, hematuria  Objective: Vitals:   10/26/20 0456 10/26/20 0500 10/26/20 0557 10/26/20 0700  BP: (!) 177/93  (!) 173/99 (!) 176/88  Pulse: 72 72 62 81  Resp: 17 (!) 21 16   Temp: 98.4 F (36.9 C)  98.4 F (36.9 C)   TempSrc: Oral  Oral   SpO2: 99% 96% 97% 95%  Weight:      Height:        Intake/Output Summary (Last 24 hours) at 10/26/2020 0742 Last data filed at 10/26/2020 0557 Gross per 24 hour  Intake 2949.67 ml  Output --  Net 2949.67 ml  Weight change:  Exam:  General:  Pt is alert, follows commands appropriately, not in acute distress HEENT: No icterus, No thrush, No neck mass, [place]/AT Cardiovascular: RRR, S1/S2, no rubs, no gallops Respiratory: CTA bilaterally, no wheezing, no crackles, no rhonchi Abdomen: Soft/+BS, epigastric tender, non distended, no guarding Extremities: No edema, No lymphangitis, No petechiae, No rashes, no synovitis   Data Reviewed: I have personally reviewed following labs and imaging  studies Basic Metabolic Panel: Recent Labs  Lab 10/25/20 1930  NA 136  K 4.1  CL 107  CO2 21*  GLUCOSE 138*  BUN 22*  CREATININE 1.38*  CALCIUM 8.6*   Liver Function Tests: No results for input(s): AST, ALT, ALKPHOS, BILITOT, PROT, ALBUMIN in the last 168 hours. No results for input(s): LIPASE, AMYLASE in the last 168 hours. No results for input(s): AMMONIA in the last 168 hours. Coagulation Profile: Recent Labs  Lab 10/25/20 2028  INR 1.1   CBC: Recent Labs  Lab 10/25/20 1930 10/26/20 0025 10/26/20 0426  WBC 6.6  --  4.6  NEUTROABS 5.1  --   --   HGB 3.8* 4.8* 7.3*  HCT 14.8* 17.2* 24.7*  MCV 73.3*  --  76.9*  PLT 265  --  214   Cardiac Enzymes: No results for input(s): CKTOTAL, CKMB, CKMBINDEX, TROPONINI in the last 168 hours. BNP: Invalid input(s): POCBNP CBG: No results for input(s): GLUCAP in the last 168 hours. HbA1C: No results for input(s): HGBA1C in the last 72 hours. Urine analysis:    Component Value Date/Time   COLORURINE STRAW (A) 10/25/2020 2129   APPEARANCEUR CLEAR 10/25/2020 2129   APPEARANCEUR Clear 10/15/2014 1029   LABSPEC 1.004 (L) 10/25/2020 2129   PHURINE 6.0 10/25/2020 2129   GLUCOSEU NEGATIVE 10/25/2020 2129   HGBUR LARGE (A) 10/25/2020 2129   BILIRUBINUR NEGATIVE 10/25/2020 2129   BILIRUBINUR Negative 10/15/2014 1029   KETONESUR NEGATIVE 10/25/2020 2129   PROTEINUR NEGATIVE 10/25/2020 2129   UROBILINOGEN 0.2 07/05/2012 0416   NITRITE NEGATIVE 10/25/2020 2129   LEUKOCYTESUR NEGATIVE 10/25/2020 2129   Sepsis Labs: @LABRCNTIP (procalcitonin:4,lacticidven:4) ) Recent Results (from the past 240 hour(s))  Resp Panel by RT-PCR (Flu A&B, Covid) Nasopharyngeal Swab     Status: None   Collection Time: 10/25/20  8:46 PM   Specimen: Nasopharyngeal Swab; Nasopharyngeal(NP) swabs in vial transport medium  Result Value Ref Range Status   SARS Coronavirus 2 by RT PCR NEGATIVE NEGATIVE Final    Comment: (NOTE) SARS-CoV-2 target nucleic  acids are NOT DETECTED.  The SARS-CoV-2 RNA is generally detectable in upper respiratory specimens during the acute phase of infection. The lowest concentration of SARS-CoV-2 viral copies this assay can detect is 138 copies/mL. A negative result does not preclude SARS-Cov-2 infection and should not be used as the sole basis for treatment or other patient management decisions. A negative result may occur with  improper specimen collection/handling, submission of specimen other than nasopharyngeal swab, presence of viral mutation(s) within the areas targeted by this assay, and inadequate number of viral copies(<138 copies/mL). A negative result must be combined with clinical observations, patient history, and epidemiological information. The expected result is Negative.  Fact Sheet for Patients:  10/27/20  Fact Sheet for Healthcare Providers:  BloggerCourse.com  This test is no t yet approved or cleared by the [place] FDA and  has been authorized for detection and/or diagnosis of SARS-CoV-2 by FDA under an Emergency Use Authorization (EUA). This EUA will remain  in effect (meaning this test can be used)  for the duration of the COVID-19 declaration under Section 564(b)(1) of the Act, 21 U.S.C.section 360bbb-3(b)(1), unless the authorization is terminated  or revoked sooner.       Influenza A by PCR NEGATIVE NEGATIVE Final   Influenza B by PCR NEGATIVE NEGATIVE Final    Comment: (NOTE) The Xpert Xpress SARS-CoV-2/FLU/RSV plus assay is intended as an aid in the diagnosis of influenza from Nasopharyngeal swab specimens and should not be used as a sole basis for treatment. Nasal washings and aspirates are unacceptable for Xpert Xpress SARS-CoV-2/FLU/RSV testing.  Fact Sheet for Patients: BloggerCourse.com  Fact Sheet for Healthcare Providers: SeriousBroker.it  This  test is not yet approved or cleared by the [place] FDA and has been authorized for detection and/or diagnosis of SARS-CoV-2 by FDA under an Emergency Use Authorization (EUA). This EUA will remain in effect (meaning this test can be used) for the duration of the COVID-19 declaration under Section 564(b)(1) of the Act, 21 U.S.C. section 360bbb-3(b)(1), unless the authorization is terminated or revoked.  Performed at [facility], [address], [place], [place] [address]      Scheduled Meds:  amoxicillin-clavulanate  1 tablet Oral Q12H   Chlorhexidine Gluconate Cloth  6 each Topical Q0600   megestrol  80 mg Oral TID   pantoprazole  40 mg Oral BID AC   Continuous Infusions:  Procedures/Studies: DG Chest Port 1 View  Result Date: 10/25/2020 CLINICAL DATA:  Shortness of breath. EXAM: PORTABLE CHEST 1 VIEW COMPARISON:  Chest radiograph dated 11/13/2016. FINDINGS: No focal consolidation, pleural effusion, or pneumothorax. The cardiac silhouette is within limits. No acute osseous pathology. IMPRESSION: No active disease. Electronically Signed   By: Elgie Collard M.D.   On: 10/25/2020 20:42    Catarina Hartshorn, DO  [facility]  If 7PM-7AM, please contact night-coverage [email or web address] Password [email or web address] 10/26/2020, 7:42 AM   LOS: 0 days

## 2020-10-26 NOTE — TOC Initial Note (Signed)
Transition of Care [facility]) - Initial/Assessment Note    Patient Details  Name: Paula Mullins MRN: [id number] Date of Birth: 1970/08/07  [facility]) CM/SW Contact:    Shade Flood, LCSW Phone Number: 10/26/2020, 3:19 PM  Clinical Narrative:                  Pt admitted from home. She does not have insurance listed. Received consult for medication assistance needs. Met with pt to assess. Pt states she lives with a friend. She is independent in ADLs. Per pt, she has family planning Medicaid which doesn't cover much. Provided pt with information on [facility]. TOC can assist with MATCH voucher if needed at dc.  TOC will follow.  Expected Discharge Plan: Home/Self Care Barriers to Discharge: Continued Medical Work up   Patient Goals and CMS Choice Patient states their goals for this hospitalization and ongoing recovery are:: go home      Expected Discharge Plan and Services Expected Discharge Plan: Home/Self Care In-house Referral: Clinical Social Work Discharge Planning Services: [place] arrangements for the past 2 months: [place]                                      Prior Living Arrangements/Services Living arrangements for the past 2 months: [place] Lives with:: Roommate Patient language and need for interpreter reviewed:: Yes Do you feel safe going back to the place where you live?: Yes      Need for Family Participation in Patient Care: No (Comment) Care giver support system in place?: Yes (comment)   Criminal Activity/Legal Involvement Pertinent to Current Situation/Hospitalization: No - Comment as needed  Activities of Daily Living Home Assistive Devices/Equipment: None ADL Screening (condition at time of admission) Patient's cognitive ability adequate to safely complete daily activities?: Yes Is the patient deaf or have difficulty hearing?: No Does the patient have difficulty seeing, even  when wearing glasses/contacts?: No Does the patient have difficulty concentrating, remembering, or making decisions?: No Patient able to express need for assistance with ADLs?: Yes Does the patient have difficulty dressing or bathing?: No Independently performs ADLs?: Yes (appropriate for developmental age) Does the patient have difficulty walking or climbing stairs?: No Weakness of Legs: None Weakness of Arms/Hands: None  Permission Sought/Granted                  Emotional Assessment Appearance:: Appears younger than stated age Attitude/Demeanor/Rapport: Engaged Affect (typically observed): Pleasant Orientation: : Oriented to Self, Oriented to Place, Oriented to  Time, Oriented to Situation Alcohol / Substance Use: Illicit Drugs Psych Involvement: No (comment)  Admission diagnosis:  Dysfunctional uterine bleeding [N93.8] Abnormal uterine bleeding [N93.9] Pain, dental [K08.89] Symptomatic anemia [D64.9] Acute on chronic blood loss anemia [D62] Acute blood loss anemia [D62] Patient Active Problem List   Diagnosis Date Noted   CKD (chronic kidney disease) stage 3, GFR 30-59 ml/min ([place]) 10/26/2020   Acute blood loss anemia 10/26/2020   Acute on chronic blood loss anemia 10/25/2020   Abnormal uterine bleeding 10/25/2020   Vitamin D deficiency 03/01/2020   Essential hypertension 08/10/2018   Cocaine abuse ([place]) 07/26/2016   Bipolar I disorder ([place]) 07/26/2016   High risk sexual behavior 10/20/2014   IFG (impaired fasting glucose) 10/15/2014   Iron deficiency anemia 10/15/2014   Migraine 10/15/2014   PCP:  [name], NP Pharmacy:  [place], [place] - [address] [place] [address] Phone: [phone] Fax: [place], [place] [address] AT [place] [place] [address] Phone: [phone] Fax:  [phone]     Social Determinants of Health (SDOH) Interventions    Readmission Risk Interventions No flowsheet data found.

## 2020-10-27 DIAGNOSIS — D62 Acute posthemorrhagic anemia: Secondary | ICD-10-CM

## 2020-10-27 DIAGNOSIS — D649 Anemia, unspecified: Secondary | ICD-10-CM

## 2020-10-27 LAB — TYPE AND SCREEN
ABO/RH(D): O POS
Antibody Screen: NEGATIVE
Unit division: 0
Unit division: 0
Unit division: 0
Unit division: 0

## 2020-10-27 LAB — CBC
HCT: 31 % — ABNORMAL LOW (ref 36.0–46.0)
Hemoglobin: 9.5 g/dL — ABNORMAL LOW (ref 12.0–15.0)
MCH: 23.7 pg — ABNORMAL LOW (ref 26.0–34.0)
MCHC: 30.6 g/dL (ref 30.0–36.0)
MCV: 77.3 fL — ABNORMAL LOW (ref 80.0–100.0)
Platelets: 232 10*3/uL (ref 150–400)
RBC: 4.01 MIL/uL (ref 3.87–5.11)
RDW: 19.7 % — ABNORMAL HIGH (ref 11.5–15.5)
WBC: 7.6 10*3/uL (ref 4.0–10.5)
nRBC: 0.8 % — ABNORMAL HIGH (ref 0.0–0.2)

## 2020-10-27 LAB — BASIC METABOLIC PANEL
Anion gap: 5 (ref 5–15)
BUN: 17 mg/dL (ref 6–20)
CO2: 24 mmol/L (ref 22–32)
Calcium: 8.3 mg/dL — ABNORMAL LOW (ref 8.9–10.3)
Chloride: 108 mmol/L (ref 98–111)
Creatinine, Ser: 1.29 mg/dL — ABNORMAL HIGH (ref 0.44–1.00)
GFR, Estimated: 50 mL/min — ABNORMAL LOW (ref >60)
Glucose, Bld: 115 mg/dL — ABNORMAL HIGH (ref 70–99)
Potassium: 3.9 mmol/L (ref 3.5–5.1)
Sodium: 137 mmol/L (ref 135–145)

## 2020-10-27 LAB — BPAM RBC
Blood Product Expiration Date: 202211212359
Blood Product Expiration Date: 202211232359
Blood Product Expiration Date: 202211232359
Blood Product Expiration Date: 202211272359
ISSUE DATE / TIME: 202210232203
ISSUE DATE / TIME: 202210240003
ISSUE DATE / TIME: 202210240205
ISSUE DATE / TIME: 202210240432
Unit Type and Rh: 5100
Unit Type and Rh: 5100
Unit Type and Rh: 5100
Unit Type and Rh: 5100

## 2020-10-27 LAB — MAGNESIUM: Magnesium: 2.1 mg/dL (ref 1.7–2.4)

## 2020-10-27 MED ORDER — FERROUS SULFATE 325 (65 FE) MG PO TABS
325.0000 mg | ORAL_TABLET | Freq: Every day | ORAL | Status: DC
  Administered 2020-10-27: 325 mg via ORAL
  Filled 2020-10-27: qty 1

## 2020-10-27 MED ORDER — VITAMIN D (ERGOCALCIFEROL) 1.25 MG (50000 UNIT) PO CAPS: 50000.0000 [IU] | ORAL_CAPSULE | ORAL | 1 refills | Status: DC

## 2020-10-27 MED ORDER — AMLODIPINE BESYLATE 10 MG PO TABS: 10.0000 mg | ORAL_TABLET | Freq: Every day | ORAL | 1 refills | Status: DC

## 2020-10-27 MED ORDER — FERROUS SULFATE 325 (65 FE) MG PO TABS: 325.0000 mg | ORAL_TABLET | Freq: Every day | ORAL | 3 refills | Status: DC

## 2020-10-27 MED ORDER — PANTOPRAZOLE SODIUM 40 MG PO TBEC: 40.0000 mg | DELAYED_RELEASE_TABLET | Freq: Two times a day (BID) | ORAL | 1 refills | Status: DC

## 2020-10-27 MED ORDER — AMLODIPINE BESYLATE 5 MG PO TABS
10.0000 mg | ORAL_TABLET | Freq: Every day | ORAL | Status: DC
  Administered 2020-10-27: 10 mg via ORAL

## 2020-10-27 MED ORDER — MEGESTROL ACETATE 40 MG PO TABS: 120.0000 mg | ORAL_TABLET | Freq: Every day | ORAL | 0 refills | Status: AC

## 2020-10-27 MED ORDER — HYDROCODONE-ACETAMINOPHEN 5-325 MG PO TABS: 1.0000 | ORAL_TABLET | ORAL | 0 refills | Status: DC | PRN

## 2020-10-27 MED ORDER — AMOXICILLIN-POT CLAVULANATE 875-125 MG PO TABS: 1.0000 | ORAL_TABLET | Freq: Two times a day (BID) | ORAL | 0 refills | Status: DC

## 2020-10-27 NOTE — TOC Transition Note (Signed)
Transition of Care [facility]) - CM/SW Discharge Note   Patient Details  Name: Paula Mullins MRN: [id number] Date of Birth: November 26, 1970  Transition of Care [facility]) CM/SW Contact:  Shade Flood, LCSW Phone Number: 10/27/2020, 10:51 AM   Clinical Narrative:     Pt stable for dc today per MD. Met with pt to provide [facility] voucher and to encourage follow up with [facility] for PCP and dental care and also with scheduled GYN appointment. Pt states she will follow up.  There are no other TOC needs for dc.  Final next level of care: Home/Self Care Barriers to Discharge: Barriers Resolved   Patient Goals and CMS Choice Patient states their goals for this hospitalization and ongoing recovery are:: go home      Discharge Placement                       Discharge Plan and Services In-house Referral: Clinical Social Work Discharge Planning Services: [facility]                                 Social Determinants of Health (SDOH) Interventions     Readmission Risk Interventions No flowsheet data found.

## 2020-10-27 NOTE — Progress Notes (Signed)
Nsg Discharge Note  Admit Date:  10/25/2020 Discharge date: 10/27/2020   [name] to be D/C'd Home per MD order.  AVS completed.  Copy for chart, and copy for patient signed, and dated. Patient/caregiver able to verbalize understanding.  Discharge Medication: Allergies as of 10/27/2020       Reactions   Flagyl [metronidazole] Swelling        Medication List     STOP taking these medications    Cholecalciferol 1.25 MG (50000 UT) Tabs   ibuprofen 200 MG tablet Commonly known as: ADVIL   naproxen 500 MG tablet Commonly known as: Naprosyn   Slow Fe 142 (45 Fe) MG Tbcr Generic drug: Ferrous Sulfate Replaced by: ferrous sulfate 325 (65 FE) MG tablet       TAKE these medications    amLODipine 10 MG tablet Commonly known as: NORVASC Take 1 tablet (10 mg total) by mouth daily. Start taking on: October 28, 2020   amoxicillin-clavulanate 875-125 MG tablet Commonly known as: AUGMENTIN Take 1 tablet by mouth every 12 (twelve) hours.   ferrous sulfate 325 (65 FE) MG tablet Take 1 tablet (325 mg total) by mouth daily with breakfast. Start taking on: October 28, 2020 Replaces: Slow Fe 142 (45 Fe) MG Tbcr   HYDROcodone-acetaminophen 5-325 MG tablet Commonly known as: NORCO/VICODIN Take 1 tablet by mouth every 4 (four) hours as needed for moderate pain.   megestrol 40 MG tablet Commonly known as: MEGACE Take 3 tablets (120 mg total) by mouth daily.   pantoprazole 40 MG tablet Commonly known as: PROTONIX Take 1 tablet (40 mg total) by mouth 2 (two) times daily before a meal.   Vitamin D (Ergocalciferol) 1.25 MG (50000 UNIT) Caps capsule Commonly known as: DRISDOL Take 1 capsule (50,000 Units total) by mouth every 7 (seven) days.        Discharge Assessment: Vitals:   10/27/20 0324 10/27/20 0500  BP: 137/89 (!) 141/82  Pulse: 75 73  Resp: 20 20  Temp: 98 F (36.7 C) 98.2 F (36.8 C)  SpO2: 98% 96%   Skin clean, dry and intact without evidence of  skin break down, no evidence of skin tears noted. IV catheter discontinued intact. Site without signs and symptoms of complications - no redness or edema noted at insertion site, patient denies c/o pain - only slight tenderness at site.  Dressing with slight pressure applied.  D/c Instructions-Education: Discharge instructions given to patient/family with verbalized understanding. D/c education completed with patient/family including follow up instructions, medication list, d/c activities limitations if indicated, with other d/c instructions as indicated by MD - patient able to verbalize understanding, all questions fully answered. Patient instructed to return to ED, call 911, or call MD for any changes in condition.  Patient escorted via WC, and D/C home via private auto.  Demetrio Lapping, LPN 14/[id number]/9295 74:73 PM

## 2020-10-27 NOTE — Discharge Summary (Addendum)
Physician Discharge Summary  Paula Mullins PNT:[id number] DOB: [birth year] DOA: 10/25/2020  PCP: Marjie Skiff, NP  Admit date: 10/25/2020 Discharge date: 10/27/2020  Admitted From: Home Disposition:  Home   Recommendations for Outpatient Follow-up:  Follow up with PCP in 1-2 weeks Please obtain BMP/CBC in one week     Discharge Condition: Stable CODE STATUS: FULL Diet recommendation: Heart Healthy    Brief/Interim Summary: 50 year old female with a history of hypertension and anxiety presenting with multiple complaints.  Patient has been having left lower dental pain for approximately 3 to 4 months.  She has been taking NSAIDs on a daily basis for her pain and discomfort.  She denies any purulent drainage but states that it hurts to chew on the left side.  She denies any fevers, chills, headache, neck pain.  The patient has been having some dyspnea on exertion and epigastric and substernal chest discomfort for the past 3 to 4 months.  She states that she has been having large-volume vaginal bleeding on a daily basis for the past 6 months.  She has only gone 1 week without any vaginal bleeding.  She denies any over-the-counter medications or illicit drug use.  She denies any coughing, hemoptysis, hematemesis, hematochezia, melena.  There is no dysuria or hematuria.  She denies any frank abdominal pain.  She states that she has not been taking any prescription medications or over-the-counter medications other than the NSAIDs. In the emergency department, the patient had low-grade temperature 9 9.2 F.  She was hemodynamically stable with oxygen saturation 99% on room air.  BMP showed a sodium 136, potassium 4.1, serum creatinine 1.3.  WBC 6.6, hemoglobin 3.8, platelets 265,000.  UA showed 6-10 WBC.  TSH 3.402.  EKG shows sinus rhythm with no ST/T wave changes.    Discharge Diagnoses:   Acute on chronic blood loss anemia/abnormal uterine bleeding -Continue Megace 80 mg 3 times  daily -Pelvic ultrasound -Transfused 4 units PRBC, 2 units FFP -Hgb remained stable at 9.5 after transfustion -appreciate GYN consult -pelvic US---27mm endometrial thickening; longated cystic structure adjacent to Left Ovary -given IV premarin 25 mg during hospitalization x 2 doses -started on megace 120 mg daily>>Rx one month provided at d/c -outpatient appointment arranged for patient at [facility] on 11/06/20 -bleed subsided with above interventions -will need outpt endometrial bx   Dental pain/dental care -Start amox/clav>>plan 8 more days after discharge -will need outpatient dental followup   Iron Deficiency anemia due to chronic blood loss -iron saturation 2 -ferritin 3 -folate 10.9 -B12--238 -ferric gluconate x 1 -d/c home with ferrous sulfate 325 mg daily   Chest Pain/Elevated Troponin -check trops 19>>22 -due to demand ischemia -Echo EF--65-70%, no WMA, G2DD, mild-mod MR -personally reviewed CXR--no infiltrates or edema -personally reviewed EKG--sinus, no STT changes   CKD stage 3a -baseline creatinine 1.2-1.3 -serum creatinine 1.29 on day of d/c   Essential Hypertension -started amlodipine   Impaired Glucose Tolerance -10/26/20 check A1C--5.4 -02/28/20 A1C--6.0  Vitamin D deficiency--start ergocalciferol -25 vitamin D level 13.59   Cocaine Use -UDS positive cocaine  SOCIAL -resources given for PCP establishment and dentists -[facility] voucher given at time of d/c -appointment with GYN, Dr. Despina Hidden on 11/06/20    Discharge Instructions   Allergies as of 10/27/2020       Reactions   Flagyl [metronidazole] Swelling        Medication List     STOP taking these medications    Cholecalciferol 1.25 MG (50000 UT) Tabs  ibuprofen 200 MG tablet Commonly known as: ADVIL   naproxen 500 MG tablet Commonly known as: Naprosyn   Slow Fe 142 (45 Fe) MG Tbcr Generic drug: Ferrous Sulfate Replaced by: ferrous sulfate 325 (65 FE) MG tablet        TAKE these medications    amLODipine 10 MG tablet Commonly known as: NORVASC Take 1 tablet (10 mg total) by mouth daily. Start taking on: October 28, 2020   amoxicillin-clavulanate 875-125 MG tablet Commonly known as: AUGMENTIN Take 1 tablet by mouth every 12 (twelve) hours.   ferrous sulfate 325 (65 FE) MG tablet Take 1 tablet (325 mg total) by mouth daily with breakfast. Start taking on: October 28, 2020 Replaces: Slow Fe 142 (45 Fe) MG Tbcr   HYDROcodone-acetaminophen 5-325 MG tablet Commonly known as: NORCO/VICODIN Take 1 tablet by mouth every 4 (four) hours as needed for moderate pain.   megestrol 40 MG tablet Commonly known as: MEGACE Take 3 tablets (120 mg total) by mouth daily.   pantoprazole 40 MG tablet Commonly known as: PROTONIX Take 1 tablet (40 mg total) by mouth 2 (two) times daily before a meal.   Vitamin D (Ergocalciferol) 1.25 MG (50000 UNIT) Caps capsule Commonly known as: DRISDOL Take 1 capsule (50,000 Units total) by mouth every 7 (seven) days.        Follow-up Information     Myna Hidalgo, DO. Schedule an appointment as soon as possible for a visit in 1 week(s).   Specialty: Obstetrics and Gynecology Why: Please call to confirm an appointment for next week Contact information: [address] [place] [address] [phone]                Allergies  Allergen Reactions   Flagyl [Metronidazole] Swelling    Consultations: GYN   Procedures/Studies: DG Chest Port 1 View  Result Date: 10/25/2020 CLINICAL DATA:  Shortness of breath. EXAM: PORTABLE CHEST 1 VIEW COMPARISON:  Chest radiograph dated 11/13/2016. FINDINGS: No focal consolidation, pleural effusion, or pneumothorax. The cardiac silhouette is within limits. No acute osseous pathology. IMPRESSION: No active disease. Electronically Signed   By: Elgie Collard M.D.   On: 10/25/2020 20:42   ECHOCARDIOGRAM COMPLETE  Result Date: 10/26/2020    ECHOCARDIOGRAM REPORT    Patient Name:   Paula Mullins Date of Exam: 10/26/2020 Medical Rec #:  [id number]       Height:       64.0 in Accession #:    [id number]      Weight:       134.0 lb Date of Birth:  April 18, 1970        BSA:          1.650 m Patient Age:    50 years        BP:           176/88 mmHg Patient Gender: F               HR:           81 bpm. [facility] Procedure: 2D Echo, Cardiac Doppler and Color Doppler Indications:    Dyspnea  History:        Patient has no prior history of Echocardiogram examinations.                 Risk Factors:Hypertension.  Sonographer:    Mikki Harbor Referring Phys: [phone] Javaria Knapke  Sonographer Comments: Image acquisition challenging due to uncooperative patient. Patient requested the  study be terminated due to pain. No A2, ALAX, Subcostal or SSN images. IMPRESSIONS  1. Left ventricular ejection fraction, by estimation, is 65 to 70%. The left ventricle has normal function. The left ventricle has no regional wall motion abnormalities. Left ventricular diastolic parameters are consistent with Grade II diastolic dysfunction (pseudonormalization).  2. Right ventricular systolic function is normal. The right ventricular size is normal. There is moderately elevated pulmonary artery systolic pressure.  3. Left atrial size was mildly dilated.  4. The mitral valve is normal in structure. Mild to moderate mitral valve regurgitation.  5. The aortic valve is tricuspid. Aortic valve regurgitation is not visualized. Mild to moderate aortic valve sclerosis/calcification is present, without any evidence of aortic stenosis. FINDINGS  Left Ventricle: Left ventricular ejection fraction, by estimation, is 65 to 70%. The left ventricle has normal function. The left ventricle has no regional wall motion abnormalities. The left ventricular internal cavity size was normal in size. There is  no left ventricular hypertrophy. Left ventricular diastolic parameters are consistent with Grade II diastolic  dysfunction (pseudonormalization). Right Ventricle: The right ventricular size is normal. No increase in right ventricular wall thickness. Right ventricular systolic function is normal. There is moderately elevated pulmonary artery systolic pressure. The tricuspid regurgitant velocity is 3.12 m/s, and with an assumed right atrial pressure of 8 mmHg, the estimated right ventricular systolic pressure is 46.9 mmHg. Left Atrium: Left atrial size was mildly dilated. Right Atrium: Right atrial size was normal in size. Pericardium: There is no evidence of pericardial effusion. Mitral Valve: The mitral valve is normal in structure. Mild to moderate mitral valve regurgitation. MV peak gradient, 7.4 mmHg. The mean mitral valve gradient is 2.0 mmHg. Tricuspid Valve: The tricuspid valve is normal in structure. Tricuspid valve regurgitation is mild. Aortic Valve: The aortic valve is tricuspid. Aortic valve regurgitation is not visualized. Mild to moderate aortic valve sclerosis/calcification is present, without any evidence of aortic stenosis. Aortic valve mean gradient measures 2.0 mmHg. Aortic valve peak gradient measures 5.6 mmHg. Aortic valve area, by VTI measures 2.39 cm. Pulmonic Valve: The pulmonic valve was normal in structure. Pulmonic valve regurgitation is not visualized. Aorta: The aortic root is normal in size and structure. IAS/Shunts: No atrial level shunt detected by color flow Doppler.  LEFT VENTRICLE PLAX 2D LVIDd:         4.90 cm   Diastology LVIDs:         3.10 cm   LV e' medial:    8.70 cm/s LV PW:         1.20 cm   LV E/e' medial:  12.4 LV IVS:        1.00 cm   LV e' lateral:   10.30 cm/s LVOT diam:     2.00 cm   LV E/e' lateral: 10.5 LV SV:         55 LV SV Index:   33 LVOT Area:     3.14 cm  RIGHT VENTRICLE RV Basal diam:  2.95 cm RV Mid diam:    2.60 cm RV S prime:     13.50 cm/s TAPSE (M-mode): 3.3 cm LEFT ATRIUM           Index        RIGHT ATRIUM           Index LA diam:      4.40 cm 2.67 cm/m   RA  Area:     17.00 cm LA Vol (A4C): 66.9 ml 40.54 ml/m  RA Volume:   43.90 ml  26.60 ml/m  AORTIC VALVE                    PULMONIC VALVE AV Area (Vmax):    2.28 cm     PV Vmax:       0.96 m/s AV Area (Vmean):   2.43 cm     PV Peak grad:  3.7 mmHg AV Area (VTI):     2.39 cm AV Vmax:           118.00 cm/s AV Vmean:          71.500 cm/s AV VTI:            0.229 m AV Peak Grad:      5.6 mmHg AV Mean Grad:      2.0 mmHg LVOT Vmax:         85.50 cm/s LVOT Vmean:        55.300 cm/s LVOT VTI:          0.174 m LVOT/AV VTI ratio: 0.76  AORTA Ao Root diam: 3.40 cm MITRAL VALVE                TRICUSPID VALVE MV Area (PHT): 5.13 cm     TR Peak grad:   38.9 mmHg MV Area VTI:   1.76 cm     TR Vmax:        312.00 cm/s MV Peak grad:  7.4 mmHg MV Mean grad:  2.0 mmHg     SHUNTS MV Vmax:       1.36 m/s     Systemic VTI:  0.17 m MV Vmean:      65.8 cm/s    Systemic Diam: 2.00 cm MV Decel Time: 148 msec MV E velocity: 108.00 cm/s MV A velocity: 53.60 cm/s MV E/A ratio:  2.01 Dietrich Pates MD Electronically signed by Dietrich Pates MD Signature Date/Time: 10/26/2020/8:09:21 PM    Final    US PELVIC COMPLETE WITH TRANSVAGINAL  Result Date: 10/26/2020 CLINICAL DATA:  Abnormal uterine bleeding.  Postmenopausal bleeding EXAM: TRANSABDOMINAL AND TRANSVAGINAL ULTRASOUND OF PELVIS TECHNIQUE: Both transabdominal and transvaginal ultrasound examinations of the pelvis were performed. Transabdominal technique was performed for global imaging of the pelvis including uterus, ovaries, adnexal regions, and pelvic cul-de-sac. It was necessary to proceed with endovaginal exam following the transabdominal exam to visualize the ovaries and endometrium. COMPARISON:  None FINDINGS: Uterus Measurements: 10.6 x 6.4 x 9.1 cm = volume: 323 mL. No fibroids or other mass visualized. Multiple nabothian cysts noted at the level of the cervix. Endometrium Thickness: 20 mm. Heterogeneous appearance of the endometrium. No well-defined mass. Right ovary  Measurements: 2.7 x 1.8 x 2.5 cm = volume: 6 mL. Normal appearance/no adnexal mass. Left ovary Measurements: 3.4 x 2.0 x 3.7 cm = volume: 13 mL. Elongated cystic area appears partially within the left ovary measuring 3.8 x 1.0 x 2.6 cm. No solid or nodular component. No internal vascularity. Other findings Trace free fluid within the cul-de-sac. IMPRESSION: 1. Abnormally thickened endometrium measuring 20 mm. In the setting of post-menopausal bleeding, endometrial sampling is indicated to exclude carcinoma. If results are benign, sonohysterogram should be considered for focal lesion work-up. (Ref: Radiological Reasoning: Algorithmic Workup of Abnormal Vaginal Bleeding with Endovaginal Sonography and Sonohysterography. AJR 2008; 500:X38-18) 2. Elongated cystic appearing structure within or adjacent to the left ovary measuring up to 3.8 cm. This could potentially represent a dilated fallopian tube versus a involuting cyst. Recommend followup  US in 3-6 months. Note: This recommendation does not apply to premenarchal patients or to those with increased risk (genetic, family history, elevated tumor markers or other high-risk factors) of ovarian cancer. Reference: Radiology 2019 Nov; 293(2):359-371. Electronically Signed   By: Duanne Guess D.O.   On: 10/26/2020 11:24        Discharge Exam: Vitals:   10/27/20 0324 10/27/20 0500  BP: 137/89 (!) 141/82  Pulse: 75 73  Resp: 20 20  Temp: 98 F (36.7 C) 98.2 F (36.8 C)  SpO2: 98% 96%   Vitals:   10/26/20 1939 10/26/20 2226 10/27/20 0324 10/27/20 0500  BP: (!) 183/96 (!) 157/87 137/89 (!) 141/82  Pulse: 67 86 75 73  Resp: Temp: 98.1 F (36.7 C) 97.6 F (36.4 C) 98 F (36.7 C) 98.2 F (36.8 C)  TempSrc: Oral Oral Oral Oral  SpO2: 100% 100% 98% 96%  Weight:      Height:        General: Pt is alert, awake, not in acute distress Cardiovascular: RRR, S1/S2 +, no rubs, no gallops Respiratory: CTA bilaterally, no wheezing, no  rhonchi Abdominal: Soft, NT, ND, bowel sounds + Extremities: no edema, no cyanosis   The results of significant diagnostics from this hospitalization (including imaging, microbiology, ancillary and laboratory) are listed below for reference.    Significant Diagnostic Studies: DG Chest Port 1 View  Result Date: 10/25/2020 CLINICAL DATA:  Shortness of breath. EXAM: PORTABLE CHEST 1 VIEW COMPARISON:  Chest radiograph dated 11/13/2016. FINDINGS: No focal consolidation, pleural effusion, or pneumothorax. The cardiac silhouette is within limits. No acute osseous pathology. IMPRESSION: No active disease. Electronically Signed   By: Elgie Collard M.D.   On: 10/25/2020 20:42   ECHOCARDIOGRAM COMPLETE  Result Date: 10/26/2020    ECHOCARDIOGRAM REPORT   Patient Name:   ADLYN FIFE Date of Exam: 10/26/2020 Medical Rec #:  [id number]       Height:       64.0 in Accession #:    [id number]      Weight:       134.0 lb Date of Birth:  09/23/1970        BSA:          1.650 m Patient Age:    50 years        BP:           176/88 mmHg Patient Gender: F               HR:           81 bpm. [facility] Procedure: 2D Echo, Cardiac Doppler and Color Doppler Indications:    Dyspnea  History:        Patient has no prior history of Echocardiogram examinations.                 Risk Factors:Hypertension.  Sonographer:    Mikki Harbor Referring Phys: [phone] Domini Vandehei  Sonographer Comments: Image acquisition challenging due to uncooperative patient. Patient requested the study be terminated due to pain. No A2, ALAX, Subcostal or SSN images. IMPRESSIONS  1. Left ventricular ejection fraction, by estimation, is 65 to 70%. The left ventricle has normal function. The left ventricle has no regional wall motion abnormalities. Left ventricular diastolic parameters are consistent with Grade II diastolic dysfunction (pseudonormalization).  2. Right ventricular systolic function is normal. The right ventricular size is  normal. There is moderately elevated pulmonary artery systolic pressure.  3. Left atrial size was mildly dilated.  4. The mitral valve is normal in structure. Mild to moderate mitral valve regurgitation.  5. The aortic valve is tricuspid. Aortic valve regurgitation is not visualized. Mild to moderate aortic valve sclerosis/calcification is present, without any evidence of aortic stenosis. FINDINGS  Left Ventricle: Left ventricular ejection fraction, by estimation, is 65 to 70%. The left ventricle has normal function. The left ventricle has no regional wall motion abnormalities. The left ventricular internal cavity size was normal in size. There is  no left ventricular hypertrophy. Left ventricular diastolic parameters are consistent with Grade II diastolic dysfunction (pseudonormalization). Right Ventricle: The right ventricular size is normal. No increase in right ventricular wall thickness. Right ventricular systolic function is normal. There is moderately elevated pulmonary artery systolic pressure. The tricuspid regurgitant velocity is 3.12 m/s, and with an assumed right atrial pressure of 8 mmHg, the estimated right ventricular systolic pressure is 46.9 mmHg. Left Atrium: Left atrial size was mildly dilated. Right Atrium: Right atrial size was normal in size. Pericardium: There is no evidence of pericardial effusion. Mitral Valve: The mitral valve is normal in structure. Mild to moderate mitral valve regurgitation. MV peak gradient, 7.4 mmHg. The mean mitral valve gradient is 2.0 mmHg. Tricuspid Valve: The tricuspid valve is normal in structure. Tricuspid valve regurgitation is mild. Aortic Valve: The aortic valve is tricuspid. Aortic valve regurgitation is not visualized. Mild to moderate aortic valve sclerosis/calcification is present, without any evidence of aortic stenosis. Aortic valve mean gradient measures 2.0 mmHg. Aortic valve peak gradient measures 5.6 mmHg. Aortic valve area, by VTI measures 2.39  cm. Pulmonic Valve: The pulmonic valve was normal in structure. Pulmonic valve regurgitation is not visualized. Aorta: The aortic root is normal in size and structure. IAS/Shunts: No atrial level shunt detected by color flow Doppler.  LEFT VENTRICLE PLAX 2D LVIDd:         4.90 cm   Diastology LVIDs:         3.10 cm   LV e' medial:    8.70 cm/s LV PW:         1.20 cm   LV E/e' medial:  12.4 LV IVS:        1.00 cm   LV e' lateral:   10.30 cm/s LVOT diam:     2.00 cm   LV E/e' lateral: 10.5 LV SV:         55 LV SV Index:   33 LVOT Area:     3.14 cm  RIGHT VENTRICLE RV Basal diam:  2.95 cm RV Mid diam:    2.60 cm RV S prime:     13.50 cm/s TAPSE (M-mode): 3.3 cm LEFT ATRIUM           Index        RIGHT ATRIUM           Index LA diam:      4.40 cm 2.67 cm/m   RA Area:     17.00 cm LA Vol (A4C): 66.9 ml 40.54 ml/m  RA Volume:   43.90 ml  26.60 ml/m  AORTIC VALVE                    PULMONIC VALVE AV Area (Vmax):    2.28 cm     PV Vmax:       0.96 m/s AV Area (Vmean):   2.43 cm     PV Peak grad:  3.7 mmHg AV Area (VTI):  2.39 cm AV Vmax:           118.00 cm/s AV Vmean:          71.500 cm/s AV VTI:            0.229 m AV Peak Grad:      5.6 mmHg AV Mean Grad:      2.0 mmHg LVOT Vmax:         85.50 cm/s LVOT Vmean:        55.300 cm/s LVOT VTI:          0.174 m LVOT/AV VTI ratio: 0.76  AORTA Ao Root diam: 3.40 cm MITRAL VALVE                TRICUSPID VALVE MV Area (PHT): 5.13 cm     TR Peak grad:   38.9 mmHg MV Area VTI:   1.76 cm     TR Vmax:        312.00 cm/s MV Peak grad:  7.4 mmHg MV Mean grad:  2.0 mmHg     SHUNTS MV Vmax:       1.36 m/s     Systemic VTI:  0.17 m MV Vmean:      65.8 cm/s    Systemic Diam: 2.00 cm MV Decel Time: 148 msec MV E velocity: 108.00 cm/s MV A velocity: 53.60 cm/s MV E/A ratio:  2.01 Dietrich Pates MD Electronically signed by Dietrich Pates MD Signature Date/Time: 10/26/2020/8:09:21 PM    Final    US PELVIC COMPLETE WITH TRANSVAGINAL  Result Date: 10/26/2020 CLINICAL DATA:  Abnormal  uterine bleeding.  Postmenopausal bleeding EXAM: TRANSABDOMINAL AND TRANSVAGINAL ULTRASOUND OF PELVIS TECHNIQUE: Both transabdominal and transvaginal ultrasound examinations of the pelvis were performed. Transabdominal technique was performed for global imaging of the pelvis including uterus, ovaries, adnexal regions, and pelvic cul-de-sac. It was necessary to proceed with endovaginal exam following the transabdominal exam to visualize the ovaries and endometrium. COMPARISON:  None FINDINGS: Uterus Measurements: 10.6 x 6.4 x 9.1 cm = volume: 323 mL. No fibroids or other mass visualized. Multiple nabothian cysts noted at the level of the cervix. Endometrium Thickness: 20 mm. Heterogeneous appearance of the endometrium. No well-defined mass. Right ovary Measurements: 2.7 x 1.8 x 2.5 cm = volume: 6 mL. Normal appearance/no adnexal mass. Left ovary Measurements: 3.4 x 2.0 x 3.7 cm = volume: 13 mL. Elongated cystic area appears partially within the left ovary measuring 3.8 x 1.0 x 2.6 cm. No solid or nodular component. No internal vascularity. Other findings Trace free fluid within the cul-de-sac. IMPRESSION: 1. Abnormally thickened endometrium measuring 20 mm. In the setting of post-menopausal bleeding, endometrial sampling is indicated to exclude carcinoma. If results are benign, sonohysterogram should be considered for focal lesion work-up. (Ref: Radiological Reasoning: Algorithmic Workup of Abnormal Vaginal Bleeding with Endovaginal Sonography and Sonohysterography. AJR 2008; 063:K16-01) 2. Elongated cystic appearing structure within or adjacent to the left ovary measuring up to 3.8 cm. This could potentially represent a dilated fallopian tube versus a involuting cyst. Recommend followup US in 3-6 months. Note: This recommendation does not apply to premenarchal patients or to those with increased risk (genetic, family history, elevated tumor markers or other high-risk factors) of ovarian cancer. Reference: Radiology  2019 Nov; 293(2):359-371. Electronically Signed   By: Duanne Guess D.O.   On: 10/26/2020 11:24    Microbiology: Recent Results (from the past 240 hour(s))  Resp Panel by RT-PCR (Flu A&B, Covid) Nasopharyngeal Swab     Status: None  Collection Time: 10/25/20  8:46 PM   Specimen: Nasopharyngeal Swab; Nasopharyngeal(NP) swabs in vial transport medium  Result Value Ref Range Status   SARS Coronavirus 2 by RT PCR NEGATIVE NEGATIVE Final    Comment: (NOTE) SARS-CoV-2 target nucleic acids are NOT DETECTED.  The SARS-CoV-2 RNA is generally detectable in upper respiratory specimens during the acute phase of infection. The lowest concentration of SARS-CoV-2 viral copies this assay can detect is 138 copies/mL. A negative result does not preclude SARS-Cov-2 infection and should not be used as the sole basis for treatment or other patient management decisions. A negative result may occur with  improper specimen collection/handling, submission of specimen other than nasopharyngeal swab, presence of viral mutation(s) within the areas targeted by this assay, and inadequate number of viral copies(<138 copies/mL). A negative result must be combined with clinical observations, patient history, and epidemiological information. The expected result is Negative.  Fact Sheet for Patients:  BloggerCourse.com  Fact Sheet for Healthcare Providers:  SeriousBroker.it  This test is no t yet approved or cleared by the [place] FDA and  has been authorized for detection and/or diagnosis of SARS-CoV-2 by FDA under an Emergency Use Authorization (EUA). This EUA will remain  in effect (meaning this test can be used) for the duration of the COVID-19 declaration under Section 564(b)(1) of the Act, 21 U.S.C.section 360bbb-3(b)(1), unless the authorization is terminated  or revoked sooner.       Influenza A by PCR NEGATIVE NEGATIVE Final   Influenza B by  PCR NEGATIVE NEGATIVE Final    Comment: (NOTE) The Xpert Xpress SARS-CoV-2/FLU/RSV plus assay is intended as an aid in the diagnosis of influenza from Nasopharyngeal swab specimens and should not be used as a sole basis for treatment. Nasal washings and aspirates are unacceptable for Xpert Xpress SARS-CoV-2/FLU/RSV testing.  Fact Sheet for Patients: BloggerCourse.com  Fact Sheet for Healthcare Providers: SeriousBroker.it  This test is not yet approved or cleared by the [place] FDA and has been authorized for detection and/or diagnosis of SARS-CoV-2 by FDA under an Emergency Use Authorization (EUA). This EUA will remain in effect (meaning this test can be used) for the duration of the COVID-19 declaration under Section 564(b)(1) of the Act, 21 U.S.C. section 360bbb-3(b)(1), unless the authorization is terminated or revoked.  Performed at [facility], [address] [place], [place] [address]   MRSA Next Gen by PCR, Nasal     Status: None   Collection Time: 10/26/20  2:00 AM   Specimen: Nasal Mucosa; Nasal Swab  Result Value Ref Range Status   MRSA by PCR Next Gen NOT DETECTED NOT DETECTED Final    Comment: (NOTE) The GeneXpert MRSA Assay (FDA approved for NASAL specimens only), is one component of a comprehensive MRSA colonization surveillance program. It is not intended to diagnose MRSA infection nor to guide or monitor treatment for MRSA infections. Test performance is not FDA approved in patients less than 13 years old. Performed at [facility], [address] [place], [place] [address]      Labs: Basic Metabolic Panel: Recent Labs  Lab 10/25/20 1930 10/27/20 0547  NA 136 137  K 4.1 3.9  CL 107 108  CO2 21* 24  GLUCOSE 138* 115*  BUN 22* 17  CREATININE 1.38* 1.29*  CALCIUM 8.6* 8.3*  MG  --  2.1   Liver Function Tests: No results for input(s): AST, ALT, ALKPHOS, BILITOT, PROT, ALBUMIN in the last 168  hours. No results for input(s): LIPASE, AMYLASE  in the last 168 hours. No results for input(s): AMMONIA in the last 168 hours. CBC: Recent Labs  Lab 10/25/20 1930 10/26/20 0025 10/26/20 0426 10/26/20 1747 10/27/20 0547  WBC 6.6  --  4.6  --  7.6  NEUTROABS 5.1  --   --   --   --   HGB 3.8* 4.8* 7.3* 9.4* 9.5*  HCT 14.8* 17.2* 24.7* 31.3* 31.0*  MCV 73.3*  --  76.9*  --  77.3*  PLT 265  --  214  --  232   Cardiac Enzymes: No results for input(s): CKTOTAL, CKMB, CKMBINDEX, TROPONINI in the last 168 hours. BNP: Invalid input(s): POCBNP CBG: No results for input(s): GLUCAP in the last 168 hours.  Time coordinating discharge:  36 minutes  Signed:  Catarina Hartshorn, DO [facility] Pager: [phone] 10/27/2020, 9:38 AM

## 2020-10-27 NOTE — Plan of Care (Addendum)
Pt alert and oriented x 4. Vitals stable after pt medicated with hydrazaline for elevated BP. Pt has received 1 dose of hydrocodone this shift. Small amount of blood noted when pt went to restroom at shift change. Unable to give 0200 premarin due to not available. AC verified Med not available. Problem: Education: Goal: Knowledge of General Education information will improve Description: Including pain rating scale, medication(s)/side effects and non-pharmacologic comfort measures Outcome: Progressing   Problem: Health Behavior/Discharge Planning: Goal: Ability to manage health-related needs will improve Outcome: Progressing   Problem: Clinical Measurements: Goal: Ability to maintain clinical measurements within normal limits will improve Outcome: Progressing Goal: Will remain free from infection Outcome: Progressing Goal: Diagnostic test results will improve Outcome: Progressing Goal: Respiratory complications will improve Outcome: Progressing Goal: Cardiovascular complication will be avoided Outcome: Progressing   Problem: Activity: Goal: Risk for activity intolerance will decrease Outcome: Progressing   Problem: Nutrition: Goal: Adequate nutrition will be maintained Outcome: Progressing   Problem: Coping: Goal: Level of anxiety will decrease Outcome: Progressing   Problem: Elimination: Goal: Will not experience complications related to bowel motility Outcome: Progressing Goal: Will not experience complications related to urinary retention Outcome: Progressing   Problem: Pain Managment: Goal: General experience of comfort will improve Outcome: Progressing   Problem: Safety: Goal: Ability to remain free from injury will improve Outcome: Progressing   Problem: Skin Integrity: Goal: Risk for impaired skin integrity will decrease Outcome: Progressing

## 2020-10-27 NOTE — Progress Notes (Signed)
   10/26/20 1939 10/26/20 2226  Vitals  Temp 98.1 F (36.7 C) 97.6 F (36.4 C)  Temp Source Oral Oral  BP (!) 183/96 (!) 157/87  MAP (mmHg) 122 108  BP Location Right Arm Left Arm  BP Method Automatic Automatic  Patient Position (if appropriate) Sitting Lying  Pulse Rate 67 86  Pulse Rate Source Monitor Monitor  Resp 20 18  MD O. Adefeso contacted and hydrazaline x 1 dose ordered and given. Med effective at decreasing BP.

## 2020-10-27 NOTE — Consult Note (Signed)
[facility] Practice OB/GYN Attending Progress Note  ADMISSION DIAGNOSIS:   Active Problems:   IFG (impaired fasting glucose)   Iron deficiency anemia   Essential hypertension   Acute on chronic blood loss anemia   Abnormal uterine bleeding   CKD (chronic kidney disease) stage 3, GFR 30-59 ml/min (HCC)   Acute blood loss anemia   Subjective  Pt sitting up in bed.  Reports feeling much better today. Bleeding has significantly improved- denies heavy bleeding or passage of clots.  Seems like it is only light if not all together resolved.  Denies pelvic or abdominal pain.  She does note a headache this am, which she thinks is due to her "bad tooth."  Denies dizziness, CP or SOB.  No other acute complaints   Objective  VITALS:  height is 5\' 4"  (1.626 m) and weight is 60.8 kg. Her oral temperature is 98.2 F (36.8 C). Her blood pressure is 141/82 (abnormal) and her pulse is 73. Her respiration is 20 and oxygen saturation is 96%.   EXAMINATION: CONSTITUTIONAL: Well-developed, well-nourished female in no acute distress.  EYES: Conjunctivae and EOM are normal. Pupils are equal, round, and reactive to light. No scleral icterus.  SKIN: Skin is warm and dry. No rash noted. Not diaphoretic. No erythema. Pallor resolved. NEUROLOGIC: Alert and oriented to person, place, and time. PSYCHIATRIC: Normal mood and affect. Normal behavior. Normal judgment and thought content. CARDIOVASCULAR: RRR RESPIRATORY: CTAB MUSCULOSKELETAL: Normal range of motion. No edema and no pain with movement ABDOMEN: Soft, nontender, nondistended  Laboratory Reports: Results for orders placed or performed during the hospital encounter of 10/25/20 (from the past 72 hour(s))  Basic metabolic panel     Status: Abnormal   Collection Time: 10/25/20  7:30 PM  Result Value Ref Range   Sodium 136 135 - 145 mmol/L   Potassium 4.1 3.5 - 5.1 mmol/L   Chloride 107 98 - 111 mmol/L   CO2 21 (L) 22 - 32 mmol/L    Glucose, Bld 138 (H) 70 - 99 mg/dL    Comment: Glucose reference range applies only to samples taken after fasting for at least 8 hours.   BUN 22 (H) 6 - 20 mg/dL   Creatinine, Ser 10/27/20 (H) 0.44 - 1.00 mg/dL   Calcium 8.6 (L) 8.9 - 10.3 mg/dL   GFR, Estimated 46 (L) >60 mL/min    Comment: (NOTE) Calculated using the CKD-EPI Creatinine Equation (2021)    Anion gap 8 5 - 15    Comment: Performed at [facility], [address], [place], [place]  CBC with Differential     Status: Abnormal   Collection Time: 10/25/20  7:30 PM  Result Value Ref Range   WBC 6.6 4.0 - 10.5 K/uL   RBC 2.02 (L) 3.87 - 5.11 MIL/uL   Hemoglobin 3.8 (LL) 12.0 - 15.0 g/dL    Comment: Reticulocyte Hemoglobin testing may be clinically indicated, consider ordering this additional test 10/27/20 THIS CRITICAL RESULT HAS VERIFIED AND BEEN CALLED TO [facility],K BY BOBBIE MATTHEWS ON 10 23 2022 AT 2013, AND HAS BEEN READ BACK.     HCT 14.8 (L) 36.0 - 46.0 %   MCV 73.3 (L) 80.0 - 100.0 fL   MCH 18.8 (L) 26.0 - 34.0 pg   MCHC 25.7 (L) 30.0 - 36.0 g/dL   RDW 2014 (H) 31.5 - 17.6 %   Platelets 265 150 - 400 K/uL   nRBC 0.5 (  H) 0.0 - 0.2 %   Neutrophils Relative % 76 %   Neutro Abs 5.1 1.7 - 7.7 K/uL   Lymphocytes Relative 11 %   Lymphs Abs 0.7 0.7 - 4.0 K/uL   Monocytes Relative 10 %   Monocytes Absolute 0.7 0.1 - 1.0 K/uL   Eosinophils Relative 1 %   Eosinophils Absolute 0.1 0.0 - 0.5 K/uL   Basophils Relative 1 %   Basophils Absolute 0.0 0.0 - 0.1 K/uL   Immature Granulocytes 1 %   Abs Immature Granulocytes 0.07 0.00 - 0.07 K/uL    Comment: Performed at [facility], [address], [place], [place] [address]  hCG, serum, qualitative     Status: None   Collection Time: 10/25/20  7:30 PM  Result Value Ref Range   Preg, Serum NEGATIVE NEGATIVE    Comment:        THE SENSITIVITY OF THIS METHODOLOGY IS >10 mIU/mL. Performed at [facility], [address] [place], [place] [address]   Vitamin B12      Status: None   Collection Time: 10/25/20  7:30 PM  Result Value Ref Range   Vitamin B-12 238 180 - 914 pg/mL    Comment: (NOTE) This assay is not validated for testing neonatal or myeloproliferative syndrome specimens for Vitamin B12 levels. Performed at [facility], [address] [place], [place] [address]   Iron and TIBC     Status: Abnormal   Collection Time: 10/25/20  7:30 PM  Result Value Ref Range   Iron 12 (L) 28 - 170 ug/dL   TIBC 213 (H) 086 - 578 ug/dL   Saturation Ratios 2 (L) 10.4 - 31.8 %   UIBC 488 ug/dL    Comment: Performed at [facility], [address], [place], [place] [address]  Ferritin     Status: Abnormal   Collection Time: 10/25/20  7:30 PM  Result Value Ref Range   Ferritin 3 (L) 11 - 307 ng/mL    Comment: Performed at [facility], [address], [place], [place] [address]  Reticulocytes     Status: Abnormal   Collection Time: 10/25/20  7:30 PM  Result Value Ref Range   Retic Ct Pct 2.9 0.4 - 3.1 %   RBC. 2.07 (L) 3.87 - 5.11 MIL/uL   Retic Count, Absolute 59.2 19.0 - 186.0 K/uL   Immature Retic Fract 30.2 (H) 2.3 - 15.9 %    Comment: Performed at [facility], [address] [place], [place] [address]  Type and screen [facility]     Status: None   Collection Time: 10/25/20  8:28 PM  Result Value Ref Range   ABO/RH(D) O POS    Antibody Screen NEG    Sample Expiration 10/28/2020,2359    Unit Number [id number]    Blood Component Type RED CELLS,LR    Unit division 00    Status of Unit ISSUED,FINAL    Transfusion Status OK TO TRANSFUSE    Crossmatch Result Compatible    Unit Number [id number]    Blood Component Type RED CELLS,LR    Unit division 00    Status of Unit ISSUED,FINAL    Transfusion Status OK TO TRANSFUSE    Crossmatch Result Compatible    Unit Number [id number]    Blood Component Type RED CELLS,LR    Unit division 00    Status of Unit ISSUED,FINAL    Transfusion Status OK TO TRANSFUSE    Crossmatch Result  Compatible    Unit  Number [id number]    Blood Component Type RED CELLS,LR    Unit division 00    Status of Unit ISSUED,FINAL    Transfusion Status OK TO TRANSFUSE    Crossmatch Result      Compatible Performed at [facility], [address], [place], [place] [address]   Protime-INR     Status: None   Collection Time: 10/25/20  8:28 PM  Result Value Ref Range   Prothrombin Time 14.5 11.4 - 15.2 seconds   INR 1.1 0.8 - 1.2    Comment: (NOTE) INR goal varies based on device and disease states. Performed at [facility], [address], [place], [place] [address]   Prepare RBC (crossmatch)     Status: None   Collection Time: 10/25/20  8:30 PM  Result Value Ref Range   Order Confirmation      ORDER PROCESSED BY BLOOD BANK Performed at [facility], [address], [place], [place] [address]   Resp Panel by RT-PCR (Flu A&B, Covid) Nasopharyngeal Swab     Status: None   Collection Time: 10/25/20  8:46 PM   Specimen: Nasopharyngeal Swab; Nasopharyngeal(NP) swabs in vial transport medium  Result Value Ref Range   SARS Coronavirus 2 by RT PCR NEGATIVE NEGATIVE    Comment: (NOTE) SARS-CoV-2 target nucleic acids are NOT DETECTED.  The SARS-CoV-2 RNA is generally detectable in upper respiratory specimens during the acute phase of infection. The lowest concentration of SARS-CoV-2 viral copies this assay can detect is 138 copies/mL. A negative result does not preclude SARS-Cov-2 infection and should not be used as the sole basis for treatment or other patient management decisions. A negative result may occur with  improper specimen collection/handling, submission of specimen other than nasopharyngeal swab, presence of viral mutation(s) within the areas targeted by this assay, and inadequate number of viral copies(<138 copies/mL). A negative result must be combined with clinical observations, patient history, and epidemiological information. The expected result is Negative.  Fact  Sheet for Patients:  BloggerCourse.com  Fact Sheet for Healthcare Providers:  SeriousBroker.it  This test is no t yet approved or cleared by the [place] FDA and  has been authorized for detection and/or diagnosis of SARS-CoV-2 by FDA under an Emergency Use Authorization (EUA). This EUA will remain  in effect (meaning this test can be used) for the duration of the COVID-19 declaration under Section 564(b)(1) of the Act, 21 U.S.C.section 360bbb-3(b)(1), unless the authorization is terminated  or revoked sooner.       Influenza A by PCR NEGATIVE NEGATIVE   Influenza B by PCR NEGATIVE NEGATIVE    Comment: (NOTE) The Xpert Xpress SARS-CoV-2/FLU/RSV plus assay is intended as an aid in the diagnosis of influenza from Nasopharyngeal swab specimens and should not be used as a sole basis for treatment. Nasal washings and aspirates are unacceptable for Xpert Xpress SARS-CoV-2/FLU/RSV testing.  Fact Sheet for Patients: BloggerCourse.com  Fact Sheet for Healthcare Providers: SeriousBroker.it  This test is not yet approved or cleared by the [place] FDA and has been authorized for detection and/or diagnosis of SARS-CoV-2 by FDA under an Emergency Use Authorization (EUA). This EUA will remain in effect (meaning this test can be used) for the duration of the COVID-19 declaration under Section 564(b)(1) of the Act, 21 U.S.C. section 360bbb-3(b)(1), unless the authorization is terminated or revoked.  Performed at [facility], [address], [place], [place] [address]   Prepare RBC (crossmatch)     Status: None   Collection Time:  10/25/20  8:51 PM  Result Value Ref Range   Order Confirmation      ORDER PROCESSED BY BLOOD BANK Performed at [facility], [address], [place], [place] [address]   Prepare fresh frozen plasma     Status: None   Collection Time: 10/25/20  9:03 PM   Result Value Ref Range   Unit Number [id number]    Blood Component Type FP24 PHR THW    Unit division 00    Status of Unit ISSUED,FINAL    Transfusion Status OK TO TRANSFUSE    Unit Number [id number]    Blood Component Type FP24 PHR THW    Unit division B0    Status of Unit ISSUED,FINAL    Transfusion Status      OK TO TRANSFUSE Performed at [facility], [address] [place], [place] [address]   Urinalysis, Routine w reflex microscopic Urine, Clean Catch     Status: Abnormal   Collection Time: 10/25/20  9:29 PM  Result Value Ref Range   Color, Urine STRAW (A) YELLOW   APPearance CLEAR CLEAR   Specific Gravity, Urine 1.004 (L) 1.005 - 1.030   pH 6.0 5.0 - 8.0   Glucose, UA NEGATIVE NEGATIVE mg/dL   Hgb urine dipstick LARGE (A) NEGATIVE   Bilirubin Urine NEGATIVE NEGATIVE   Ketones, ur NEGATIVE NEGATIVE mg/dL   Protein, ur NEGATIVE NEGATIVE mg/dL   Nitrite NEGATIVE NEGATIVE   Leukocytes,Ua NEGATIVE NEGATIVE   RBC / HPF 21-50 0 - 5 RBC/hpf   WBC, UA 6-10 0 - 5 WBC/hpf   Bacteria, UA RARE (A) NONE SEEN   Squamous Epithelial / LPF 0-5 0 - 5   Mucus PRESENT    Uric Acid Crys, UA PRESENT     Comment: Performed at [facility], [address], [place], [place] [address]  Hemoglobin and hematocrit, blood     Status: Abnormal   Collection Time: 10/26/20 12:25 AM  Result Value Ref Range   Hemoglobin 4.8 (LL) 12.0 - 15.0 g/dL    Comment: CRITICAL RESULT CALLED TO, READ BACK BY AND VERIFIED WITH: SPENCE,H@0032  BY MATTHEWS,B 10.24.22 POST TRANSFUSION SPECIMEN    HCT 17.2 (L) 36.0 - 46.0 %    Comment: Performed at [facility], [address], [place], [place] [address]  MRSA Next Gen by PCR, Nasal     Status: None   Collection Time: 10/26/20  2:00 AM   Specimen: Nasal Mucosa; Nasal Swab  Result Value Ref Range   MRSA by PCR Next Gen NOT DETECTED NOT DETECTED    Comment: (NOTE) The GeneXpert MRSA Assay (FDA approved for NASAL specimens only), is one component of a  comprehensive MRSA colonization surveillance program. It is not intended to diagnose MRSA infection nor to guide or monitor treatment for MRSA infections. Test performance is not FDA approved in patients less than 55 years old. Performed at [facility], [address], [place], [place] [address]   HIV Antibody (routine testing w rflx)     Status: None   Collection Time: 10/26/20  4:26 AM  Result Value Ref Range   HIV Screen 4th Generation wRfx Non Reactive Non Reactive    Comment: Performed at [facility], [address] [place], [place] [address]  TSH     Status: None   Collection Time: 10/26/20  4:26 AM  Result Value Ref Range   TSH 3.402 0.350 - 4.500 uIU/mL    Comment: Performed by a 3rd Generation assay with a  functional sensitivity of <=0.01 uIU/mL. Performed at [facility], [address], [place], [place] [address]   Hemoglobin A1c     Status: None   Collection Time: 10/26/20  4:26 AM  Result Value Ref Range   Hgb A1c MFr Bld 5.4 4.8 - 5.6 %    Comment: (NOTE) Pre diabetes:          5.7%-6.4%  Diabetes:              >6.4%  Glycemic control for   <7.0% adults with diabetes    Mean Plasma Glucose 108.28 mg/dL    Comment: Performed at [facility], [address], [place], [place] [address]  CBC     Status: Abnormal   Collection Time: 10/26/20  4:26 AM  Result Value Ref Range   WBC 4.6 4.0 - 10.5 K/uL   RBC 3.21 (L) 3.87 - 5.11 MIL/uL   Hemoglobin 7.3 (L) 12.0 - 15.0 g/dL    Comment: POST TRANSFUSION SPECIMEN Reticulocyte Hemoglobin testing may be clinically indicated, consider ordering this additional test [id number]    HCT 24.7 (L) 36.0 - 46.0 %   MCV 76.9 (L) 80.0 - 100.0 fL   MCH 22.7 (L) 26.0 - 34.0 pg   MCHC 29.6 (L) 30.0 - 36.0 g/dL   RDW 28.4 (H) 13.2 - 44.0 %   Platelets 214 150 - 400 K/uL   nRBC 0.4 (H) 0.0 - 0.2 %    Comment: Performed at [facility], [address], [place], [place] [address]  Folate     Status: None   Collection Time:  10/26/20  4:26 AM  Result Value Ref Range   Folate 10.9 >5.9 ng/mL    Comment: Performed at [facility], [address], [place], [place] [address]  VITAMIN D 25 Hydroxy (Vit-D Deficiency, Fractures)     Status: Abnormal   Collection Time: 10/26/20  4:26 AM  Result Value Ref Range   Vit D, 25-Hydroxy 13.59 (L) 30 - 100 ng/mL    Comment: (NOTE) Vitamin D deficiency has been defined by the Institute of Medicine  and an Endocrine Society practice guideline as a level of serum 25-OH  vitamin D less than 20 ng/mL (1,2). The Endocrine Society went on to  further define vitamin D insufficiency as a level between 21 and 29  ng/mL (2).  1. IOM (Institute of Medicine). 2010. Dietary reference intakes for  calcium and D. [place]: [employer]. 2. Holick MF, Binkley [place], Bischoff-Ferrari HA, et al. Evaluation,  treatment, and prevention of vitamin D deficiency: an Endocrine  Society clinical practice guideline, JCEM. 2011 Jul; 96(7): 1911-30.  Performed at [facility], [address], [place], [place] [address]   Troponin I (High Sensitivity)     Status: Abnormal   Collection Time: 10/26/20  4:26 AM  Result Value Ref Range   Troponin I (High Sensitivity) 19 (H) <18 ng/L    Comment: (NOTE) Elevated high sensitivity troponin I (hsTnI) values and significant  changes across serial measurements may suggest ACS but many other  chronic and acute conditions are known to elevate hsTnI results.  Refer to the "Links" section for chest pain algorithms and additional  guidance. Performed at [facility], [address] [place], [place] [address]   Urine rapid drug screen (hosp performed)     Status: Abnormal   Collection Time: 10/26/20  8:12 AM  Result Value Ref Range   Opiates NONE DETECTED NONE DETECTED  Cocaine POSITIVE (A) NONE DETECTED   Benzodiazepines NONE DETECTED NONE DETECTED   Amphetamines NONE DETECTED NONE DETECTED   Tetrahydrocannabinol NONE DETECTED NONE  DETECTED   Barbiturates NONE DETECTED NONE DETECTED    Comment: (NOTE) DRUG SCREEN FOR MEDICAL PURPOSES ONLY.  IF CONFIRMATION IS NEEDED FOR ANY PURPOSE, NOTIFY LAB WITHIN 5 DAYS.  LOWEST DETECTABLE LIMITS FOR URINE DRUG SCREEN Drug Class                     Cutoff (ng/mL) Amphetamine and metabolites    1000 Barbiturate and metabolites    200 Benzodiazepine                 200 Tricyclics and metabolites     300 Opiates and metabolites        300 Cocaine and metabolites        300 THC                            50 Performed at [facility], [address], [place], [place] [address]   Troponin I (High Sensitivity)     Status: Abnormal   Collection Time: 10/26/20  9:37 AM  Result Value Ref Range   Troponin I (High Sensitivity) 22 (H) <18 ng/L    Comment: (NOTE) Elevated high sensitivity troponin I (hsTnI) values and significant  changes across serial measurements may suggest ACS but many other  chronic and acute conditions are known to elevate hsTnI results.  Refer to the "Links" section for chest pain algorithms and additional  guidance. Performed at [facility], [address] [place], [place] [address]   Hemoglobin and hematocrit, blood     Status: Abnormal   Collection Time: 10/26/20  5:47 PM  Result Value Ref Range   Hemoglobin 9.4 (L) 12.0 - 15.0 g/dL    Comment: POST TRANSFUSION SPECIMEN   HCT 31.3 (L) 36.0 - 46.0 %    Comment: Performed at [facility], [address], [place], [place] [address]  CBC     Status: Abnormal   Collection Time: 10/27/20  5:47 AM  Result Value Ref Range   WBC 7.6 4.0 - 10.5 K/uL   RBC 4.01 3.87 - 5.11 MIL/uL   Hemoglobin 9.5 (L) 12.0 - 15.0 g/dL   HCT 21.1 (L) 15.5 - 20.8 %   MCV 77.3 (L) 80.0 - 100.0 fL   MCH 23.7 (L) 26.0 - 34.0 pg   MCHC 30.6 30.0 - 36.0 g/dL   RDW 02.2 (H) 33.6 - 12.2 %   Platelets 232 150 - 400 K/uL   nRBC 0.8 (H) 0.0 - 0.2 %    Comment: Performed at [facility], [address], [place], [place] [address]   Basic metabolic panel     Status: Abnormal   Collection Time: 10/27/20  5:47 AM  Result Value Ref Range   Sodium 137 135 - 145 mmol/L   Potassium 3.9 3.5 - 5.1 mmol/L   Chloride 108 98 - 111 mmol/L   CO2 24 22 - 32 mmol/L   Glucose, Bld 115 (H) 70 - 99 mg/dL    Comment: Glucose reference range applies only to samples taken after fasting for at least 8 hours.   BUN 17 6 - 20 mg/dL   Creatinine, Ser 3.00 (H) 0.44 - 1.00 mg/dL   Calcium 8.3 (L) 8.9 - 10.3 mg/dL   GFR, Estimated 50 (L) >60 mL/min    Comment: (NOTE)  Calculated using the CKD-EPI Creatinine Equation (2021)    Anion gap 5 5 - 15    Comment: Performed at [facility], [address] [place], [place] [address]  Magnesium     Status: None   Collection Time: 10/27/20  5:47 AM  Result Value Ref Range   Magnesium 2.1 1.7 - 2.4 mg/dL    Comment: Performed at [facility], [address], [place], [place] [address]      ASSESSMENT Abnormal uterine bleeding Symptomatic anemia Chronic kidney d  PLAN -continue Megace -clinical improvement of bleeding -Hgb improved and stabilized -plan for outpatient follow up for further evaluation and management of AUB.  Discussed importance of continued care as an outpatient  Time spent: .   LOS: 1 day   Myna Hidalgo, DO Attending Obstetrician & Gynecologist, [facility] for [employer], [facility]

## 2020-10-29 ENCOUNTER — Telehealth: Payer: Self-pay

## 2020-10-29 NOTE — Telephone Encounter (Signed)
Transition Care Management Follow-up Telephone Call Date of discharge and from where: 10/27/2020 Paula Mullins How have you been since you were released from the hospital? Doing better, but still has tooth ache Any questions or concerns? No  Items Reviewed: Did the pt receive and understand the discharge instructions provided? Yes  Medications obtained and verified? No , due to insurance. Working with sister Other? No  Any new allergies since your discharge? No  Dietary orders reviewed? Yes Do you have support at home? Yes   Home Care and Equipment/Supplies: Were home health services ordered? not applicable If so, what is the name of the agency? N/a  Has the agency set up a time to come to the patient's home? not applicable Were any new equipment or medical supplies ordered?  No What is the name of the medical supply agency? N/a Were you able to get the supplies/equipment? not applicable Do you have any questions related to the use of the equipment or supplies? No  Functional Questionnaire: (I = Independent and D = Dependent) ADLs: I  Bathing/Dressing- I  Meal Prep- I  Eating- I  Maintaining continence- I  Transferring/Ambulation- I  Managing Meds- I  Follow up appointments reviewed:  PCP Hospital f/u appt confirmed? Yes  Scheduled to see Rodman Pickle NP on 11/06/2020 @ 3:20. Are transportation arrangements needed? No  If their condition worsens, is the pt aware to call PCP or go to the Emergency Dept.? Yes Was the patient provided with contact information for the PCP's office or ED? Yes Was to pt encouraged to call back with questions or concerns? Yes

## 2020-10-30 ENCOUNTER — Other Ambulatory Visit: Payer: Self-pay

## 2020-11-06 ENCOUNTER — Inpatient Hospital Stay: Payer: Medicaid Other | Admitting: Nurse Practitioner

## 2020-11-06 ENCOUNTER — Encounter: Payer: Medicaid Other | Admitting: Obstetrics & Gynecology

## 2020-11-10 ENCOUNTER — Other Ambulatory Visit: Payer: Self-pay

## 2020-11-10 ENCOUNTER — Ambulatory Visit (INDEPENDENT_AMBULATORY_CARE_PROVIDER_SITE_OTHER): Payer: Self-pay | Admitting: Nurse Practitioner

## 2020-11-10 ENCOUNTER — Encounter: Payer: Self-pay | Admitting: Nurse Practitioner

## 2020-11-10 VITALS — BP 131/78 | HR 97 | Temp 97.7°F | Wt 127.2 lb

## 2020-11-10 DIAGNOSIS — N939 Abnormal uterine and vaginal bleeding, unspecified: Secondary | ICD-10-CM

## 2020-11-10 DIAGNOSIS — N1831 Chronic kidney disease, stage 3a: Secondary | ICD-10-CM

## 2020-11-10 DIAGNOSIS — Z09 Encounter for follow-up examination after completed treatment for conditions other than malignant neoplasm: Secondary | ICD-10-CM

## 2020-11-10 DIAGNOSIS — R202 Paresthesia of skin: Secondary | ICD-10-CM

## 2020-11-10 DIAGNOSIS — I10 Essential (primary) hypertension: Secondary | ICD-10-CM

## 2020-11-10 DIAGNOSIS — R7301 Impaired fasting glucose: Secondary | ICD-10-CM

## 2020-11-10 DIAGNOSIS — D509 Iron deficiency anemia, unspecified: Secondary | ICD-10-CM

## 2020-11-10 DIAGNOSIS — K0889 Other specified disorders of teeth and supporting structures: Secondary | ICD-10-CM

## 2020-11-10 LAB — CBC WITH DIFFERENTIAL/PLATELET
Hematocrit: 36.2 % (ref 34.0–46.6)
Hemoglobin: 11.5 g/dL (ref 11.1–15.9)
Lymphocytes Absolute: 1.3 10*3/uL (ref 0.7–3.1)
Lymphs: 29 %
MCH: 24.5 pg — ABNORMAL LOW (ref 26.6–33.0)
MCHC: 31.8 g/dL (ref 31.5–35.7)
MCV: 77 fL — ABNORMAL LOW (ref 79–97)
MID (Absolute): 0.3 10*3/uL (ref 0.1–1.6)
MID: 8 %
Neutrophils Absolute: 2.7 10*3/uL (ref 1.4–7.0)
Neutrophils: 63 %
Platelets: 393 10*3/uL (ref 150–450)
RBC: 4.7 x10E6/uL (ref 3.77–5.28)
RDW: 22.9 % — ABNORMAL HIGH (ref 11.7–15.4)
WBC: 4.3 10*3/uL (ref 3.4–10.8)

## 2020-11-10 MED ORDER — FERROUS SULFATE 325 (65 FE) MG PO TABS: 325.0000 mg | ORAL_TABLET | Freq: Every day | ORAL | 3 refills | Status: DC

## 2020-11-10 NOTE — Progress Notes (Signed)
Established Patient Office Visit  Subjective:  Patient ID: Paula Mullins, female    DOB: 1970/03/13  Age: 50 y.o. MRN: [id number]  CC:  Chief Complaint  Patient presents with   Hospitalization Follow-up    HPI Paula Mullins presents for hospital follow up after symptomatic anemia, iron deficiency anemia, and tooth pain.   Transition of Care Hospital Follow up.   "Admit date: 10/25/2020 Discharge date: 10/27/2020   Admitted From: Home Disposition:  Home    Recommendations for Outpatient Follow-up:  Follow up with PCP in 1-2 weeks Please obtain BMP/CBC in one week    Discharge Condition: Stable CODE STATUS: FULL Diet recommendation: Heart Healthy      Brief/Interim Summary: 50 year old female with a history of hypertension and anxiety presenting with multiple complaints.  Patient has been having left lower dental pain for approximately 3 to 4 months.  She has been taking NSAIDs on a daily basis for her pain and discomfort.  She denies any purulent drainage but states that it hurts to chew on the left side.  She denies any fevers, chills, headache, neck pain.  The patient has been having some dyspnea on exertion and epigastric and substernal chest discomfort for the past 3 to 4 months.  She states that she has been having large-volume vaginal bleeding on a daily basis for the past 6 months.  She has only gone 1 week without any vaginal bleeding.  She denies any over-the-counter medications or illicit drug use.  She denies any coughing, hemoptysis, hematemesis, hematochezia, melena.  There is no dysuria or hematuria.  She denies any frank abdominal pain.  She states that she has not been taking any prescription medications or over-the-counter medications other than the NSAIDs. In the emergency department, the patient had low-grade temperature 9 9.2 F.  She was hemodynamically stable with oxygen saturation 99% on room air.  BMP showed a sodium 136, potassium 4.1, serum creatinine  1.3.  WBC 6.6, hemoglobin 3.8, platelets 265,000.  UA showed 6-10 WBC.  TSH 3.402.  EKG shows sinus rhythm with no ST/T wave changes.     Discharge Diagnoses:    Acute on chronic blood loss anemia/abnormal uterine bleeding -Continue Megace 80 mg 3 times daily -Pelvic ultrasound -Transfused 4 units PRBC, 2 units FFP -Hgb remained stable at 9.5 after transfustion -appreciate GYN consult -pelvic US---57mm endometrial thickening; longated cystic structure adjacent to Left Ovary -given IV premarin 25 mg during hospitalization x 2 doses -started on megace 120 mg daily>>Rx one month provided at d/c -outpatient appointment arranged for patient at [facility] on 11/06/20 -bleed subsided with above interventions -will need outpt endometrial bx   Dental pain/dental care -Start amox/clav>>plan 8 more days after discharge -will need outpatient dental followup   Iron Deficiency anemia due to chronic blood loss -iron saturation 2 -ferritin 3 -folate 10.9 -B12--238 -ferric gluconate x 1 -d/c home with ferrous sulfate 325 mg daily   Chest Pain/Elevated Troponin -check trops 19>>22 -due to demand ischemia -Echo EF--65-70%, no WMA, G2DD, mild-mod MR -personally reviewed CXR--no infiltrates or edema -personally reviewed EKG--sinus, no STT changes   CKD stage 3a -baseline creatinine 1.2-1.3 -serum creatinine 1.29 on day of d/c   Essential Hypertension -started amlodipine   Impaired Glucose Tolerance -10/26/20 check A1C--5.4 -02/28/20 A1C--6.0   Vitamin D deficiency--start ergocalciferol -25 vitamin D level 13.59   Cocaine Use -UDS positive cocaine   SOCIAL -resources given for PCP establishment and dentists -[facility] voucher given at time of d/c -  appointment with GYN, Dr. Elonda Husky on 11/06/20 "    Hospital/Facility: [facility] Physician:  Dr. Shanon Brow Tat D/C Date: 10/27/20  Records Requested: 11/10/20 Records Received:  11/10/20 Records Reviewed:  11/10/20  Diagnoses on Discharge:  abnormal uterine bleeding, dental pain, iron deficiency anemia, CKD 3a, HTN, cocaine use  Date of interactive Contact within 48 hours of discharge:  Contact was through: phone  Date of 7 day or 14 day face-to-face visit:    within 14 days  Outpatient Encounter Medications as of 11/10/2020  Medication Sig   amLODipine (NORVASC) 10 MG tablet Take 1 tablet (10 mg total) by mouth daily.   amoxicillin-clavulanate (AUGMENTIN) 875-125 MG tablet Take 1 tablet by mouth every 12 (twelve) hours.   megestrol (MEGACE) 40 MG tablet Take 3 tablets (120 mg total) by mouth daily.   pantoprazole (PROTONIX) 40 MG tablet Take 1 tablet (40 mg total) by mouth 2 (two) times daily before a meal.   Vitamin D, Ergocalciferol, (DRISDOL) 1.25 MG (50000 UNIT) CAPS capsule Take 1 capsule (50,000 Units total) by mouth every 7 (seven) days.   ferrous sulfate 325 (65 FE) MG tablet Take 1 tablet (325 mg total) by mouth daily with breakfast.   [DISCONTINUED] ferrous sulfate 325 (65 FE) MG tablet Take 1 tablet (325 mg total) by mouth daily with breakfast. (Patient not taking: Reported on 11/10/2020)   [DISCONTINUED] HYDROcodone-acetaminophen (NORCO/VICODIN) 5-325 MG tablet Take 1 tablet by mouth every 4 (four) hours as needed for moderate pain.   No facility-administered encounter medications on file as of 11/10/2020.    Diagnostic Tests Reviewed/Disposition: Reviewed on chart  Consults: None  Discharge Instructions Per above  Disease/illness Education: Completed   Home Health/Community Services Discussions/Referrals: List of dentists provided   Establishment or re-establishment of referral orders for community resources: GYN  Discussion with other health care providers: Reviewed notes  Assessment and Support of treatment regimen adherence: Reviewed with patient. She has not started taking iron supplement as discussed on discharge   Appointments Coordinated with:  GYN  Education for self-management, independent  living, and ADLs:  Reviewed with patient today   Past Medical History:  Diagnosis Date   Anxiety    Pt reports anxiety   Hypertension    Migraine    Sleep apnea     Past Surgical History:  Procedure Laterality Date   CESAREAN SECTION  2011   HERNIA REPAIR  2012   TUBAL LIGATION      Family History  Problem Relation Age of Onset   Cancer Sister        Ovarian   Cancer Maternal Grandmother        Leukemia   Asthma Mother    Allergies Mother     Social History   Socioeconomic History   Marital status: Legally Separated    Spouse name: Not on file   Number of children: 8   Years of education: Not on file   Highest education level: Not on file  Occupational History   Occupation: Unemployed  Tobacco Use   Smoking status: Never   Smokeless tobacco: Never  Vaping Use   Vaping Use: Never used  Substance and Sexual Activity   Alcohol use: Not Currently   Drug use: Yes    Frequency: 1.0 times per week    Types: Cocaine, Marijuana    Comment: patient has used within the last week   Sexual activity: Yes    Birth control/protection: Surgical  Other Topics Concern   Not on  file  Social History Narrative   Live in [place], [place]   Has had 8 children   Single/separated.   3 kids at home.    Social Determinants of Health   Financial Resource Strain: Not on file  Food Insecurity: Not on file  Transportation Needs: Not on file  Physical Activity: Not on file  Stress: Not on file  Social Connections: Not on file  Intimate Partner Violence: Not on file    Outpatient Medications Prior to Visit  Medication Sig Dispense Refill   amLODipine (NORVASC) 10 MG tablet Take 1 tablet (10 mg total) by mouth daily. 30 tablet 1   amoxicillin-clavulanate (AUGMENTIN) 875-125 MG tablet Take 1 tablet by mouth every 12 (twelve) hours. 16 tablet 0   megestrol (MEGACE) 40 MG tablet Take 3 tablets (120 mg total) by mouth daily. 90 tablet 0   pantoprazole (PROTONIX) 40 MG tablet Take 1  tablet (40 mg total) by mouth 2 (two) times daily before a meal. 60 tablet 1   Vitamin D, Ergocalciferol, (DRISDOL) 1.25 MG (50000 UNIT) CAPS capsule Take 1 capsule (50,000 Units total) by mouth every 7 (seven) days. 5 capsule 1   ferrous sulfate 325 (65 FE) MG tablet Take 1 tablet (325 mg total) by mouth daily with breakfast. (Patient not taking: Reported on 11/10/2020)  3   HYDROcodone-acetaminophen (NORCO/VICODIN) 5-325 MG tablet Take 1 tablet by mouth every 4 (four) hours as needed for moderate pain. 20 tablet 0   No facility-administered medications prior to visit.    Allergies  Allergen Reactions   Flagyl [Metronidazole] Swelling    ROS Review of Systems  Constitutional: Negative.   HENT:  Positive for dental problem.   Respiratory:  Positive for shortness of breath (mild today).   Cardiovascular: Negative.   Gastrointestinal: Negative.   Genitourinary: Negative.        Denies any vaginal bleeding since leaving the hospital   Musculoskeletal: Negative.   Skin: Negative.   Neurological:  Positive for numbness (in fingertips this morning) and headaches. Negative for dizziness.     Objective:    Physical Exam Vitals and nursing note reviewed.  Constitutional:      General: She is not in acute distress.    Appearance: Normal appearance.  HENT:     Head: Normocephalic and atraumatic.  Eyes:     Conjunctiva/sclera: Conjunctivae normal.  Neck:     Vascular: No carotid bruit.  Cardiovascular:     Rate and Rhythm: Normal rate and regular rhythm.     Pulses: Normal pulses.     Heart sounds: Normal heart sounds.  Pulmonary:     Effort: Pulmonary effort is normal.     Breath sounds: Normal breath sounds.  Abdominal:     Palpations: Abdomen is soft.     Tenderness: There is no abdominal tenderness.  Musculoskeletal:     Cervical back: Normal range of motion.  Skin:    General: Skin is warm and dry.     Coloration: Skin is pale.  Neurological:     General: No focal  deficit present.     Mental Status: She is alert and oriented to person, place, and time.  Psychiatric:        Mood and Affect: Mood normal.        Behavior: Behavior normal.        Thought Content: Thought content normal.        Judgment: Judgment normal.    BP 131/78 (BP Location: Left Arm,  Cuff Size: Normal)   Pulse 97   Temp 97.7 F (36.5 C) (Oral)   Wt 127 lb 3.2 oz (57.7 kg)   SpO2 98%   BMI 21.83 kg/m  Wt Readings from Last 3 Encounters:  11/10/20 127 lb 3.2 oz (57.7 kg)  10/26/20 134 lb 0.6 oz (60.8 kg)  02/28/20 135 lb 3.2 oz (61.3 kg)     Health Maintenance Due  Topic Date Due   COLONOSCOPY (Pts 45-14yrs Insurance coverage will need to be confirmed)  Never done   PAP SMEAR-Modifier  10/27/2017   MAMMOGRAM  02/03/2018   Zoster Vaccines- Shingrix (1 of 2) Never done    There are no preventive care reminders to display for this patient.  Lab Results  Component Value Date   TSH 3.402 10/26/2020   Lab Results  Component Value Date   WBC 4.3 11/10/2020   HGB 11.5 11/10/2020   HCT 36.2 11/10/2020   MCV 77 (L) 11/10/2020   PLT 393 11/10/2020   Lab Results  Component Value Date   NA 137 10/27/2020   K 3.9 10/27/2020   CO2 24 10/27/2020   GLUCOSE 115 (H) 10/27/2020   BUN 17 10/27/2020   CREATININE 1.29 (H) 10/27/2020   BILITOT 0.2 02/28/2020   ALKPHOS 76 02/28/2020   AST 15 02/28/2020   ALT 13 02/28/2020   PROT 6.7 02/28/2020   ALBUMIN 4.0 02/28/2020   CALCIUM 8.3 (L) 10/27/2020   ANIONGAP 5 10/27/2020   Lab Results  Component Value Date   CHOL 138 02/28/2020   Lab Results  Component Value Date   HDL 70 02/28/2020   Lab Results  Component Value Date   LDLCALC 46 02/28/2020   Lab Results  Component Value Date   TRIG 130 02/28/2020   No results found for: CHOLHDL Lab Results  Component Value Date   HGBA1C 5.4 10/26/2020      Assessment & Plan:   Problem List Items Addressed This Visit       Cardiovascular and Mediastinum    Essential hypertension    Chronic, well controlled on amlodipine. Continue current regimen. Check CMP today. Follow up in 3 months.         Endocrine   IFG (impaired fasting glucose) - Primary    A1C 5.4% during hospitalization on 10/2020. Continue watching carbs and sugars. Follow up in 6 months.         Genitourinary   Abnormal uterine bleeding    Had vaginal bleeding for 6 months prior to hospitalization. Bleeding stopped since starting megace. Transvaginal ultrasound showed uterine stripe 63mm and elongated cyst near left ovary. She did not feel comfortable with prior GYN. She would like to go back to [facility] in [place]. Referral placed. Highly encouraged her prompt follow up.       Relevant Orders   CBC With Differential/Platelet (Completed)   Ambulatory referral to Gynecology   CKD (chronic kidney disease) stage 3, GFR 30-59 ml/min (HCC)    Most recent GFR 50. Will check CMP today. Medication list reviewed and medications are renally dosed.       Relevant Orders   Comprehensive metabolic panel     Other   Iron deficiency anemia    Hemoglobin improved today to 11.5. Still recommend starting iron supplement to increase iron stores. Follow up in 2 months to repeat CBC and ferritin.       Relevant Medications   ferrous sulfate 325 (65 FE) MG tablet   Other Relevant Orders  CBC With Differential/Platelet (Completed)   Ambulatory referral to Gynecology   Other Visit Diagnoses     Tingling       Tingling in finger tips. Hgb 11.5 in office today. Will check vitamin B12.    Relevant Orders   Vitamin B12   Hospital discharge follow-up       Tooth pain       Continue augmentin. Encourage her to set up an appointment with dentist. She doesn't have dental insurance. Will place referral to see if can help get establish   Relevant Orders   Ambulatory referral to Dentistry       Meds ordered this encounter  Medications   ferrous sulfate 325 (65 FE) MG tablet     Sig: Take 1 tablet (325 mg total) by mouth daily with breakfast.    Refill:  3     Follow-up: Return in about 2 months (around 01/10/2021) for abnormal uterine bleeding, anemia .    Charyl Dancer, NP

## 2020-11-10 NOTE — Assessment & Plan Note (Signed)
Had vaginal bleeding for 6 months prior to hospitalization. Bleeding stopped since starting megace. Transvaginal ultrasound showed uterine stripe 62mm and elongated cyst near left ovary. She did not feel comfortable with prior GYN. She would like to go back to [facility] in [place]. Referral placed. Highly encouraged her prompt follow up.

## 2020-11-10 NOTE — Assessment & Plan Note (Signed)
A1C 5.4% during hospitalization on 10/2020. Continue watching carbs and sugars. Follow up in 6 months.

## 2020-11-10 NOTE — Assessment & Plan Note (Addendum)
Most recent GFR 50. Will check CMP today. Medication list reviewed and medications are renally dosed.

## 2020-11-10 NOTE — Patient Instructions (Addendum)
Water- based lubricant  Start slo FE 65mg  (iron supplement daily) take it with food

## 2020-11-10 NOTE — Assessment & Plan Note (Signed)
Hemoglobin improved today to 11.5. Still recommend starting iron supplement to increase iron stores. Follow up in 2 months to repeat CBC and ferritin.

## 2020-11-10 NOTE — Assessment & Plan Note (Signed)
Chronic, well controlled on amlodipine. Continue current regimen. Check CMP today. Follow up in 3 months.

## 2020-11-11 ENCOUNTER — Telehealth: Payer: Self-pay | Admitting: Nurse Practitioner

## 2020-11-11 LAB — COMPREHENSIVE METABOLIC PANEL
ALT: 9 IU/L (ref 0–32)
AST: 11 IU/L (ref 0–40)
Albumin/Globulin Ratio: 1.7 (ref 1.2–2.2)
Albumin: 4.3 g/dL (ref 3.8–4.9)
Alkaline Phosphatase: 58 IU/L (ref 44–121)
BUN/Creatinine Ratio: 16 (ref 9–23)
BUN: 25 mg/dL — ABNORMAL HIGH (ref 6–24)
Bilirubin Total: 0.5 mg/dL (ref 0.0–1.2)
CO2: 21 mmol/L (ref 20–29)
Calcium: 9.8 mg/dL (ref 8.7–10.2)
Chloride: 109 mmol/L — ABNORMAL HIGH (ref 96–106)
Creatinine, Ser: 1.53 mg/dL — ABNORMAL HIGH (ref 0.57–1.00)
Globulin, Total: 2.6 g/dL (ref 1.5–4.5)
Glucose: 129 mg/dL — ABNORMAL HIGH (ref 70–99)
Potassium: 4.7 mmol/L (ref 3.5–5.2)
Sodium: 143 mmol/L (ref 134–144)
Total Protein: 6.9 g/dL (ref 6.0–8.5)
eGFR: 41 mL/min/{1.73_m2} — ABNORMAL LOW (ref >59)

## 2020-11-11 LAB — VITAMIN B12: Vitamin B-12: 367 pg/mL (ref 232–1245)

## 2020-11-11 MED ORDER — FERROUS SULFATE 325 (65 FE) MG PO TABS: 325.0000 mg | ORAL_TABLET | Freq: Every day | ORAL | 4 refills | Status: DC

## 2020-11-11 NOTE — Telephone Encounter (Signed)
Routing to provider. Can these be sent in as a prescription for the patient?

## 2020-11-11 NOTE — Telephone Encounter (Signed)
Copied from CRM [id number]. Topic: General - Other >> Nov 11, 2020 11:42 AM Gwenlyn Fudge wrote: Reason for CRM: Pt called stating that she was supposed to receive a prescription for iron pills. Attempted to advise pt that those were otc per prescription yesterday and she states that she wants to speak with a nurse instead. Please advise.

## 2020-11-13 NOTE — Telephone Encounter (Signed)
Spoke with patient and notified her that she can purchase the iron pills over the counter due to her insurance not covering prescription that patients can purchase over the counter. Patient was informed that both providers have tried to send in the prescription and was not approved by insurance. Patient verbalized understanding and has no further questions at this time.

## 2020-11-17 ENCOUNTER — Telehealth: Payer: Self-pay | Admitting: Nurse Practitioner

## 2020-11-17 NOTE — Telephone Encounter (Signed)
Copied from CRM [id number]. Topic: General - Other >> Nov 17, 2020  9:28 AM Gwenlyn Fudge wrote: Reason for CRM: Pt called and is requesting to have a nurse give her a call back to go over her lab results. Please advise.

## 2020-11-17 NOTE — Telephone Encounter (Signed)
Called and spoke to patient. She states she was able to get on her mychart and view results.

## 2020-11-17 NOTE — Telephone Encounter (Signed)
Pt requesting another call to go over results.

## 2020-12-03 ENCOUNTER — Encounter: Payer: Medicaid Other | Admitting: Obstetrics & Gynecology

## 2020-12-21 ENCOUNTER — Encounter: Payer: Medicaid Other | Admitting: Obstetrics & Gynecology

## 2020-12-24 ENCOUNTER — Encounter: Payer: Medicaid Other | Admitting: Adult Health

## 2020-12-31 ENCOUNTER — Encounter: Payer: Medicaid Other | Admitting: Obstetrics and Gynecology

## 2021-01-11 ENCOUNTER — Ambulatory Visit: Payer: Self-pay | Admitting: Nurse Practitioner

## 2021-03-22 ENCOUNTER — Encounter (HOSPITAL_COMMUNITY): Payer: Self-pay

## 2021-03-22 ENCOUNTER — Emergency Department (HOSPITAL_COMMUNITY): Payer: Self-pay

## 2021-03-22 ENCOUNTER — Emergency Department (HOSPITAL_COMMUNITY)
Admission: EM | Admit: 2021-03-22 | Discharge: 2021-03-22 | Disposition: A | Discharge status: Home / Self Care | Payer: Self-pay | Attending: Emergency Medicine | Admitting: Emergency Medicine

## 2021-03-22 ENCOUNTER — Other Ambulatory Visit: Payer: Self-pay

## 2021-03-22 DIAGNOSIS — M795 Residual foreign body in soft tissue: Secondary | ICD-10-CM | POA: Insufficient documentation

## 2021-03-22 NOTE — ED Triage Notes (Signed)
Patient here with complaints of tongue ring being lodged in tongue since last night.  ?

## 2021-03-22 NOTE — ED Provider Notes (Signed)
?[facility] EMERGENC[facility] DEPARTMENT ?Provider Note ? ? ?CSN: [id number] ?Arrival date & time: 03/22/21  0840 ? ?  ? ?History ? ?Chief Complaint  ?Patient presents with  ? Foreign Body  ? ? ?Paula Mullins is a 51 y.o. female presenting for evaluation of a tongue piercing complication.  She had her tongue pierced early February and had no problems until noticing that the ball on the inferior aspect of the piercing was missing and she feels it has migrated into her tongue.  The piercing post itself is still in place, she has used a pencil eraser as this piercing stop to prevent her from losing the piercing itself.  She has tried to get the ball out of her tongue without success. ? ?The history is provided by the patient.  ? ?  ? ?Home Medications ?Prior to Admission medications   ?Medication Sig Start Date End Date Taking? Authorizing Provider  ?amLODipine (NORVASC) 10 MG tablet Take 1 tablet (10 mg total) by mouth daily. ?Patient not taking: Reported on 03/22/2021 10/28/20   Catarina Hartshorn, MD  ?amoxicillin-clavulanate (AUGMENTIN) 875-125 MG tablet Take 1 tablet by mouth every 12 (twelve) hours. ?Patient not taking: Reported on 03/22/2021 10/27/20   Catarina Hartshorn, MD  ?ferrous sulfate 325 (65 FE) MG tablet Take 1 tablet (325 mg total) by mouth daily with breakfast. ?Patient not taking: Reported on 03/22/2021 11/11/20   Aura Dials T, NP  ?pantoprazole (PROTONIX) 40 MG tablet Take 1 tablet (40 mg total) by mouth 2 (two) times daily before a meal. ?Patient not taking: Reported on 03/22/2021 10/27/20   Catarina Hartshorn, MD  ?Vitamin D, Ergocalciferol, (DRISDOL) 1.25 MG (50000 UNIT) CAPS capsule Take 1 capsule (50,000 Units total) by mouth every 7 (seven) days. ?Patient not taking: Reported on 03/22/2021 10/27/20   Catarina Hartshorn, MD  ?   ? ?Allergies    ?Flagyl [metronidazole]   ? ?Review of Systems   ?Review of Systems  ?Constitutional:  Negative for chills and fever.  ?HENT:  Negative for congestion, facial swelling, sore throat and  trouble swallowing.   ?Eyes: Negative.   ?Respiratory:  Negative for chest tightness and shortness of breath.   ?Cardiovascular:  Negative for chest pain.  ?Gastrointestinal:  Negative for abdominal pain and nausea.  ?Genitourinary: Negative.   ?Musculoskeletal:  Negative for arthralgias.  ?Skin: Negative.  Negative for rash and wound.  ?Neurological: Negative.   ?Psychiatric/Behavioral: Negative.    ?All other systems reviewed and are negative. ? ?Physical Exam ?Updated Vital Signs ?BP (!) 173/101 (BP Location: Left Arm)   Pulse 73   Temp 97.9 ?F (36.6 ?C) (Oral)   Resp 17   Ht 5\' 4"  (1.626 m)   Wt 63.5 kg   SpO2 100%   BMI 24.03 kg/m?  ?Physical Exam ?Vitals and nursing note reviewed.  ?Constitutional:   ?   Appearance: She is well-developed.  ?HENT:  ?   Head: Normocephalic and atraumatic.  ?   Mouth/Throat:  ?   Tongue: No lesions.  ?   Pharynx: Oropharynx is clear. Uvula midline.  ?   Comments: Tongue piercing site is then placed with inferior aspect of the stud clearly visualized.  There is no obvious foreign body appreciated with tongue palpation.  There is no significant tongue edema or erythema, no purulent drainage from piercing site. ?Cardiovascular:  ?   Rate and Rhythm: Normal rate.  ?Pulmonary:  ?   Effort: Pulmonary effort is normal.  ?Musculoskeletal:     ?  General: Normal range of motion.  ?   Cervical back: Normal range of motion.  ?Skin: ?   General: Skin is warm and dry.  ?Neurological:  ?   Mental Status: She is alert.  ? ? ?ED Results / Procedures / Treatments   ?Labs ?(all labs ordered are listed, but only abnormal results are displayed) ?Labs Reviewed - No data to display ? ?EKG ?None ? ?Radiology ?DG Mandible 1-3 Views ? ?Result Date: 03/22/2021 ?CLINICAL DATA:  Evaluate location of tongue stud EXAM: MANDIBLE - 1-3 VIEW COMPARISON:  None. FINDINGS: No fracture is seen. There is a metallic foreign body tongue stud in the anterior aspect of the tongue. There is metallic ball in the  superior end of the stud and somewhat square-shaped structure in the inferior aspect of the stud. IMPRESSION: No fracture is seen in the mandible.  Tongue stud is noted in place. Electronically Signed   By: Ernie Avena M.D.   On: 03/22/2021 10:22   ? ?Procedures ?Procedures  ? ? ?Medications Ordered in ED ?Medications - No data to display ? ?ED Course/ Medical Decision Making/ A&P ?  ?                        ?Medical Decision Making ?Patient with concern of embedded piercing ball within her tongue, did pierced stud is still present within the tongue, no swelling, pain or other complications. ? ?Amount and/or Complexity of Data Reviewed ?Radiology: ordered. ?   Details: Mandible x-rays were ordered and viewed.  The piercing is in place, there is not a loose ball foreign body within her tongue. ? ?Risk ?Risk Details: Parent follow-up anticipated.  Discussed with patient that she may have swallowed the ball portion of the piercing, but this should not cause any complications. ? ? ? ? ? ? ? ? ? ? ?Final Clinical Impression(s) / ED Diagnoses ?Final diagnoses:  ?Foreign body (FB) in soft tissue  ? ? ?Rx / DC Orders ?ED Discharge Orders   ? ? None  ? ?  ? ? ?  ?Burgess Amor, PA-C ?03/22/21 1035 ? ?  ?Cheryll Cockayne, MD ?04/02/21 1641 ? ?

## 2021-03-22 NOTE — Discharge Instructions (Addendum)
As discussed, there is no ball in your tongue, just the main piercing stud.  You may have swallowed the ball, which should not cause any complications - it should pass in your stool within 1-2 days. ?

## 2021-03-29 ENCOUNTER — Ambulatory Visit: Payer: Self-pay | Admitting: Nurse Practitioner

## 2021-03-31 ENCOUNTER — Ambulatory Visit: Payer: Self-pay | Admitting: Nurse Practitioner

## 2021-09-28 ENCOUNTER — Ambulatory Visit: Payer: Medicaid Other | Admitting: Nurse Practitioner

## 2021-10-06 ENCOUNTER — Ambulatory Visit: Payer: Commercial Managed Care - HMO | Admitting: Nurse Practitioner

## 2021-12-07 ENCOUNTER — Ambulatory Visit (INDEPENDENT_AMBULATORY_CARE_PROVIDER_SITE_OTHER): Payer: Medicaid Other | Admitting: Unknown Physician Specialty

## 2021-12-07 ENCOUNTER — Encounter: Payer: Self-pay | Admitting: Unknown Physician Specialty

## 2021-12-07 VITALS — BP 182/98 | HR 65 | Temp 98.5°F | Ht 65.0 in | Wt 148.6 lb

## 2021-12-07 DIAGNOSIS — G43009 Migraine without aura, not intractable, without status migrainosus: Secondary | ICD-10-CM

## 2021-12-07 DIAGNOSIS — R5383 Other fatigue: Secondary | ICD-10-CM | POA: Diagnosis not present

## 2021-12-07 DIAGNOSIS — I10 Essential (primary) hypertension: Secondary | ICD-10-CM

## 2021-12-07 DIAGNOSIS — M7050 Other bursitis of knee, unspecified knee: Secondary | ICD-10-CM | POA: Diagnosis not present

## 2021-12-07 DIAGNOSIS — Z1231 Encounter for screening mammogram for malignant neoplasm of breast: Secondary | ICD-10-CM

## 2021-12-07 DIAGNOSIS — N939 Abnormal uterine and vaginal bleeding, unspecified: Secondary | ICD-10-CM

## 2021-12-07 DIAGNOSIS — E559 Vitamin D deficiency, unspecified: Secondary | ICD-10-CM | POA: Diagnosis not present

## 2021-12-07 DIAGNOSIS — Z1211 Encounter for screening for malignant neoplasm of colon: Secondary | ICD-10-CM

## 2021-12-07 MED ORDER — NAPROXEN 500 MG PO TABS: 500.0000 mg | ORAL_TABLET | Freq: Two times a day (BID) | ORAL | 0 refills | Status: DC

## 2021-12-07 MED ORDER — HYDROCHLOROTHIAZIDE 25 MG PO TABS: 25.0000 mg | ORAL_TABLET | Freq: Every day | ORAL | 3 refills | Status: DC

## 2021-12-07 NOTE — Patient Instructions (Addendum)
Low salt diet Dash diet Take Magnesium Glycinate at night or citrate (if constipated)

## 2021-12-07 NOTE — Assessment & Plan Note (Signed)
Not taking any of her medications. Start HCTZ daily.  Check labs today.

## 2021-12-07 NOTE — Assessment & Plan Note (Addendum)
I'm not sure headaches are consistent with migraine.  Pt admits that they get worse is has too much salt.  Will work to treat BP and f/u with headaches.  Only benefits from Naprosyn though contributes to high BP.  Will rx BP lowering therapy and continue Naprosyn for now

## 2021-12-07 NOTE — Assessment & Plan Note (Signed)
No longer bleeding, but hospitalized in the past for this with anemia.  Check again today

## 2021-12-07 NOTE — Assessment & Plan Note (Signed)
History of this.  Check today

## 2021-12-07 NOTE — Progress Notes (Signed)
BP (!) 182/98   Pulse 65   Temp 98.5 F (36.9 C) (Oral)   Ht _0  (1.651 m)   Wt 148 lb 9.6 oz (67.4 kg)   SpO2 96%   BMI 24.73 kg/m    Subjective:    Patient ID: Paula Mullins, female    DOB: Jul 20, 1970, 51 y.o.   MRN: [id number]  HPI: Paula Mullins is a 51 y.o. female  No chief complaint on file.  Migraines Pt states this is an ongoing problem for her and notices them daily.  This got worse with menopause and menopausal symptoms of hot flashes, insomnia. Goes to bed with them and wakes up with them.  Starts in eye and goes back to head.  Painful and can tolerate the light.  No throbbing.    Hypertension She has not taken her BP meds since since in the hospital and severely anemic from uterine bleeding.  States she never did take her BP meds as she "worked with it"  Takes Aleve as needed and sometimes takes 2-3 along with Ibuprofen.    Knee   3 weeks go started hurting and inflamed.  Worse at night.  Does not remember an injury.  Heating compresses makes it better.    Relevant past medical, surgical, family and social history reviewed and updated as indicated. Interim medical history since our last visit reviewed. Allergies and medications reviewed and updated.  Review of Systems  Constitutional:  Positive for fatigue.  Genitourinary: Negative.     Per HPI unless specifically indicated above     Objective:    BP (!) 182/98   Pulse 65   Temp 98.5 F (36.9 C) (Oral)   Ht _1  (1.651 m)   Wt 148 lb 9.6 oz (67.4 kg)   SpO2 96%   BMI 24.73 kg/m   Wt Readings from Last 3 Encounters:  12/07/21 148 lb 9.6 oz (67.4 kg)  03/22/21 140 lb (63.5 kg)  11/10/20 127 lb 3.2 oz (57.7 kg)    Physical Exam Constitutional:      General: She is not in acute distress.    Appearance: Normal appearance. She is well-developed.  HENT:     Head: Normocephalic and atraumatic.  Eyes:     General: Lids are normal. No scleral icterus.       Right eye: No discharge.        Left  eye: No discharge.     Conjunctiva/sclera: Conjunctivae normal.  Neck:     Vascular: No carotid bruit or JVD.  Cardiovascular:     Rate and Rhythm: Normal rate and regular rhythm.     Heart sounds: Normal heart sounds.  Pulmonary:     Effort: Pulmonary effort is normal. No respiratory distress.     Breath sounds: Normal breath sounds.  Abdominal:     Palpations: There is no hepatomegaly or splenomegaly.  Musculoskeletal:        General: Normal range of motion.     Cervical back: Normal range of motion and neck supple.  Skin:    General: Skin is warm and dry.     Coloration: Skin is not pale.     Findings: No rash.  Neurological:     Mental Status: She is alert and oriented to person, place, and time.  Psychiatric:        Behavior: Behavior normal.        Thought Content: Thought content normal.        Judgment:  Judgment normal.     Results for orders placed or performed in visit on 11/10/20  Comprehensive metabolic panel  Result Value Ref Range   Glucose 129 (H) 70 - 99 mg/dL   BUN 25 (H) 6 - 24 mg/dL   Creatinine, Ser 1.53 (H) 0.57 - 1.00 mg/dL   eGFR 41 (L) >59 mL/min/1.73   BUN/Creatinine Ratio 16 9 - 23   Sodium 143 134 - 144 mmol/L   Potassium 4.7 3.5 - 5.2 mmol/L   Chloride 109 (H) 96 - 106 mmol/L   CO2 21 20 - 29 mmol/L   Calcium 9.8 8.7 - 10.2 mg/dL   Total Protein 6.9 6.0 - 8.5 g/dL   Albumin 4.3 3.8 - 4.9 g/dL   Globulin, Total 2.6 1.5 - 4.5 g/dL   Albumin/Globulin Ratio 1.7 1.2 - 2.2   Bilirubin Total 0.5 0.0 - 1.2 mg/dL   Alkaline Phosphatase 58 44 - 121 IU/L   AST 11 0 - 40 IU/L   ALT 9 0 - 32 IU/L  CBC With Differential/Platelet  Result Value Ref Range   WBC 4.3 3.4 - 10.8 x10E3/uL   RBC 4.70 3.77 - 5.28 x10E6/uL   Hemoglobin 11.5 11.1 - 15.9 g/dL   Hematocrit 36.2 34.0 - 46.6 %   MCV 77 (L) 79 - 97 fL   MCH 24.5 (L) 26.6 - 33.0 pg   MCHC 31.8 31.5 - 35.7 g/dL   RDW 22.9 (H) 11.7 - 15.4 %   Platelets 393 150 - 450 x10E3/uL   Neutrophils 63 Not  Estab. %   Lymphs 29 Not Estab. %   MID 8 Not Estab. %   Neutrophils Absolute 2.7 1.4 - 7.0 x10E3/uL   Lymphocytes Absolute 1.3 0.7 - 3.1 x10E3/uL   MID (Absolute) 0.3 0.1 - 1.6 X10E3/uL  Vitamin B12  Result Value Ref Range   Vitamin B-12 367 232 - 1,245 pg/mL      Assessment & Plan:   Problem List Items Addressed This Visit       Unprioritized   Abnormal uterine bleeding    No longer bleeding, but hospitalized in the past for this with anemia.  Check again today      Relevant Orders   CBC with Differential/Platelet   Essential hypertension    Not taking any of her medications. Start HCTZ daily.  Check labs today.        Relevant Medications   hydrochlorothiazide (HYDRODIURIL) 25 MG tablet   Other Relevant Orders   Comprehensive metabolic panel   Lipid Panel w/o Chol/HDL Ratio   Migraine    I'm not sure headaches are consistent with migraine.  Pt admits that they get worse is has too much salt.  Will work to treat BP and f/u with headaches.  Only benefits from Naprosyn though contributes to high BP.  Will rx BP lowering therapy and continue Naprosyn for now      Relevant Medications   hydrochlorothiazide (HYDRODIURIL) 25 MG tablet   naproxen (NAPROSYN) 500 MG tablet   Vitamin D deficiency    History of this.  Check today      Relevant Orders   VITAMIN D 25 Hydroxy (Vit-D Deficiency, Fractures)   Other Visit Diagnoses     Screening for colon cancer    -  Primary   Relevant Orders   Ambulatory referral to Gastroenterology   Encounter for screening mammogram for malignant neoplasm of breast       Relevant Orders   MM 3D SCREEN  BREAST BILATERAL   Pes anserine bursitis       Acutely tender over this area.  Will refer to orthopedics for options   Relevant Orders   Ambulatory referral to Orthopedic Surgery   Other fatigue       Relevant Orders   CBC with Differential/Platelet   TSH   VITAMIN D 25 Hydroxy (Vit-D Deficiency, Fractures)        Follow up  plan: Return in about 4 weeks (around 01/04/2022).

## 2021-12-08 ENCOUNTER — Other Ambulatory Visit: Payer: Self-pay | Admitting: Unknown Physician Specialty

## 2021-12-08 LAB — COMPREHENSIVE METABOLIC PANEL
ALT: 15 IU/L (ref 0–32)
AST: 17 IU/L (ref 0–40)
Albumin/Globulin Ratio: 1.6 (ref 1.2–2.2)
Albumin: 4.4 g/dL (ref 3.8–4.9)
Alkaline Phosphatase: 79 IU/L (ref 44–121)
BUN/Creatinine Ratio: 17 (ref 9–23)
BUN: 21 mg/dL (ref 6–24)
Bilirubin Total: 0.9 mg/dL (ref 0.0–1.2)
CO2: 25 mmol/L (ref 20–29)
Calcium: 9.9 mg/dL (ref 8.7–10.2)
Chloride: 102 mmol/L (ref 96–106)
Creatinine, Ser: 1.23 mg/dL — ABNORMAL HIGH (ref 0.57–1.00)
Globulin, Total: 2.8 g/dL (ref 1.5–4.5)
Glucose: 154 mg/dL — ABNORMAL HIGH (ref 70–99)
Potassium: 3.7 mmol/L (ref 3.5–5.2)
Sodium: 142 mmol/L (ref 134–144)
Total Protein: 7.2 g/dL (ref 6.0–8.5)
eGFR: 53 mL/min/{1.73_m2} — ABNORMAL LOW (ref >59)

## 2021-12-08 LAB — CBC WITH DIFFERENTIAL/PLATELET
Basophils Absolute: 0 10*3/uL (ref 0.0–0.2)
Basos: 1 %
EOS (ABSOLUTE): 0.1 10*3/uL (ref 0.0–0.4)
Eos: 2 %
Hematocrit: 38 % (ref 34.0–46.6)
Hemoglobin: 12.7 g/dL (ref 11.1–15.9)
Immature Grans (Abs): 0 10*3/uL (ref 0.0–0.1)
Immature Granulocytes: 0 %
Lymphocytes Absolute: 1.2 10*3/uL (ref 0.7–3.1)
Lymphs: 25 %
MCH: 29.5 pg (ref 26.6–33.0)
MCHC: 33.4 g/dL (ref 31.5–35.7)
MCV: 88 fL (ref 79–97)
Monocytes Absolute: 0.4 10*3/uL (ref 0.1–0.9)
Monocytes: 8 %
Neutrophils Absolute: 3.1 10*3/uL (ref 1.4–7.0)
Neutrophils: 64 %
Platelets: 240 10*3/uL (ref 150–450)
RBC: 4.3 x10E6/uL (ref 3.77–5.28)
RDW: 12.6 % (ref 11.7–15.4)
WBC: 4.8 10*3/uL (ref 3.4–10.8)

## 2021-12-08 LAB — LIPID PANEL W/O CHOL/HDL RATIO
Cholesterol, Total: 195 mg/dL (ref 100–199)
HDL: 83 mg/dL (ref >39)
LDL Chol Calc (NIH): 88 mg/dL (ref 0–99)
Triglycerides: 142 mg/dL (ref 0–149)
VLDL Cholesterol Cal: 24 mg/dL (ref 5–40)

## 2021-12-08 LAB — TSH: TSH: 6.42 u[IU]/mL — ABNORMAL HIGH (ref 0.450–4.500)

## 2021-12-08 LAB — VITAMIN D 25 HYDROXY (VIT D DEFICIENCY, FRACTURES): Vit D, 25-Hydroxy: 21 ng/mL — ABNORMAL LOW (ref 30.0–100.0)

## 2021-12-08 MED ORDER — VALSARTAN 160 MG PO TABS: 160.0000 mg | ORAL_TABLET | Freq: Every day | ORAL | 3 refills | Status: DC

## 2021-12-13 ENCOUNTER — Telehealth: Payer: Self-pay

## 2021-12-13 ENCOUNTER — Other Ambulatory Visit: Payer: Self-pay

## 2021-12-13 DIAGNOSIS — Z1211 Encounter for screening for malignant neoplasm of colon: Secondary | ICD-10-CM

## 2021-12-13 MED ORDER — NA SULFATE-K SULFATE-MG SULF 17.5-3.13-1.6 GM/177ML PO SOLN: 1.0000 | Freq: Once | ORAL | 0 refills | Status: AC

## 2021-12-13 NOTE — Telephone Encounter (Signed)
Gastroenterology Pre-Procedure Review  Request Date: 12/20/21 Requesting Physician: Dr. aNNA  PATIENT REVIEW QUESTIONS: The patient responded to the following health history questions as indicated:    1. Are you having any GI issues? no 2. Do you have a personal history of Polyps? no 3. Do you have a family history of Colon Cancer or Polyps? no 4. Diabetes Mellitus? no 5. Joint replacements in the past 12 months?no 6. Major health problems in the past 3 months?no 7. Any artificial heart valves, MVP, or defibrillator?no    MEDICATIONS & ALLERGIES:    Patient reports the following regarding taking any anticoagulation/antiplatelet therapy:   Plavix, Coumadin, Eliquis, Xarelto, Lovenox, Pradaxa, Brilinta, or Effient? no Aspirin? no  Patient confirms/reports the following medications:  Current Outpatient Medications  Medication Sig Dispense Refill   Na Sulfate-K Sulfate-Mg Sulf 17.5-3.13-1.6 GM/177ML SOLN Take 1 kit by mouth once for 1 dose. 354 mL 0   valsartan (DIOVAN) 160 MG tablet Take 1 tablet (160 mg total) by mouth daily. 90 tablet 3   amLODipine (NORVASC) 10 MG tablet Take 1 tablet (10 mg total) by mouth daily. (Patient not taking: Reported on 03/22/2021) 30 tablet 1   amoxicillin-clavulanate (AUGMENTIN) 875-125 MG tablet Take 1 tablet by mouth every 12 (twelve) hours. (Patient not taking: Reported on 03/22/2021) 16 tablet 0   ferrous sulfate 325 (65 FE) MG tablet Take 1 tablet (325 mg total) by mouth daily with breakfast. (Patient not taking: Reported on 03/22/2021) 90 tablet 4   hydrochlorothiazide (HYDRODIURIL) 25 MG tablet Take 1 tablet (25 mg total) by mouth daily. 90 tablet 3   naproxen (NAPROSYN) 500 MG tablet Take 1 tablet (500 mg total) by mouth 2 (two) times daily with a meal. 30 tablet 0   pantoprazole (PROTONIX) 40 MG tablet Take 1 tablet (40 mg total) by mouth 2 (two) times daily before a meal. (Patient not taking: Reported on 03/22/2021) 60 tablet 1   Vitamin D,  Ergocalciferol, (DRISDOL) 1.25 MG (50000 UNIT) CAPS capsule Take 1 capsule (50,000 Units total) by mouth every 7 (seven) days. (Patient not taking: Reported on 03/22/2021) 5 capsule 1   No current facility-administered medications for this visit.    Patient confirms/reports the following allergies:  Allergies  Allergen Reactions   Flagyl [Metronidazole] Swelling    No orders of the defined types were placed in this encounter.   AUTHORIZATION INFORMATION Primary Insurance: 1D#: Group #:  Secondary Insurance: 1D#: Group #:  SCHEDULE INFORMATION: Date: 12/20/21 Time: Location: [facility] 

## 2021-12-13 NOTE — Telephone Encounter (Signed)
Gastroenterology Pre-Procedure Review  Request Date: 12/20/21 Requesting Physician: Dr. Vicente Males  PATIENT REVIEW QUESTIONS: The patient responded to the following health history questions as indicated:    1. Are you having any GI issues? no 2. Do you have a personal history of Polyps? no 3. Do you have a family history of Colon Cancer or Polyps? no 4. Diabetes Mellitus? no 5. Joint replacements in the past 12 months?no 6. Major health problems in the past 3 months?no 7. Any artificial heart valves, MVP, or defibrillator?no    MEDICATIONS & ALLERGIES:    Patient reports the following regarding taking any anticoagulation/antiplatelet therapy:   Plavix, Coumadin, Eliquis, Xarelto, Lovenox, Pradaxa, Brilinta, or Effient? no Aspirin? no  Patient confirms/reports the following medications:  Current Outpatient Medications  Medication Sig Dispense Refill   valsartan (DIOVAN) 160 MG tablet Take 1 tablet (160 mg total) by mouth daily. 90 tablet 3   amLODipine (NORVASC) 10 MG tablet Take 1 tablet (10 mg total) by mouth daily. (Patient not taking: Reported on 03/22/2021) 30 tablet 1   amoxicillin-clavulanate (AUGMENTIN) 875-125 MG tablet Take 1 tablet by mouth every 12 (twelve) hours. (Patient not taking: Reported on 03/22/2021) 16 tablet 0   ferrous sulfate 325 (65 FE) MG tablet Take 1 tablet (325 mg total) by mouth daily with breakfast. (Patient not taking: Reported on 03/22/2021) 90 tablet 4   hydrochlorothiazide (HYDRODIURIL) 25 MG tablet Take 1 tablet (25 mg total) by mouth daily. 90 tablet 3   Na Sulfate-K Sulfate-Mg Sulf 17.5-3.13-1.6 GM/177ML SOLN Take 1 kit by mouth once for 1 dose. 354 mL 0   naproxen (NAPROSYN) 500 MG tablet Take 1 tablet (500 mg total) by mouth 2 (two) times daily with a meal. 30 tablet 0   pantoprazole (PROTONIX) 40 MG tablet Take 1 tablet (40 mg total) by mouth 2 (two) times daily before a meal. (Patient not taking: Reported on 03/22/2021) 60 tablet 1   Vitamin D,  Ergocalciferol, (DRISDOL) 1.25 MG (50000 UNIT) CAPS capsule Take 1 capsule (50,000 Units total) by mouth every 7 (seven) days. (Patient not taking: Reported on 03/22/2021) 5 capsule 1   No current facility-administered medications for this visit.    Patient confirms/reports the following allergies:  Allergies  Allergen Reactions   Flagyl [Metronidazole] Swelling    No orders of the defined types were placed in this encounter.   AUTHORIZATION INFORMATION Primary Insurance: 1D#: Group #:  Secondary Insurance: 1D#: Group #:  SCHEDULE INFORMATION: Date: 12/20/21 Time: Location: [facility]

## 2021-12-15 ENCOUNTER — Ambulatory Visit: Payer: Self-pay | Admitting: *Deleted

## 2021-12-15 NOTE — Telephone Encounter (Signed)
In error. Please disregard.

## 2021-12-20 ENCOUNTER — Encounter: Payer: Self-pay | Admitting: Anesthesiology

## 2021-12-20 ENCOUNTER — Ambulatory Visit: Admission: RE | Admit: 2021-12-20 | Payer: Medicaid Other | Source: Home / Self Care | Admitting: Gastroenterology

## 2021-12-20 ENCOUNTER — Encounter: Admission: RE | Payer: Self-pay | Source: Home / Self Care

## 2021-12-20 SURGERY — COLONOSCOPY WITH PROPOFOL
Anesthesia: General

## 2021-12-24 ENCOUNTER — Ambulatory Visit: Payer: Self-pay

## 2021-12-24 NOTE — Telephone Encounter (Signed)
    Chief Complaint: Right knee pain, swelling Symptoms: Above Frequency: 1 month ago Pertinent Negatives: Patient denies  Disposition: [] ED /[x] Urgent Care (no appt availability in office) / [] Appointment(In office/virtual)/ []  [facility]/ [] Home Care/ [] Refused Recommended Disposition /[] [facility]/ []  Follow-up with PCP Additional Notes:   Reason for Disposition  [1] SEVERE pain (e.g., excruciating, unable to walk) AND [2] not improved after 2 hours of pain medicine  Answer Assessment - Initial Assessment Questions 1. LOCATION and RADIATION: "Where is the pain located?"      Right knee 2. QUALITY: "What does the pain feel like?"  (e.g., sharp, dull, aching, burning)     Sharp 3. SEVERITY: "How bad is the pain?" "What does it keep you from doing?"   (Scale 1-10; or mild, moderate, severe)   -  MILD (1-3): doesn't interfere with normal activities    -  MODERATE (4-7): interferes with normal activities (e.g., work or school) or awakens from sleep, limping    -  SEVERE (8-10): excruciating pain, unable to do any normal activities, unable to walk     8 4. ONSET: "When did the pain start?" "Does it come and go, or is it there all the time?"     1 month ago 5. RECURRENT: "Have you had this pain before?" If Yes, ask: "When, and what happened then?"     No 6. SETTING: "Has there been any recent work, exercise or other activity that involved that part of the body?"      No 7. AGGRAVATING FACTORS: "What makes the knee pain worse?" (e.g., walking, climbing stairs, running)     Walking 8. ASSOCIATED SYMPTOMS: "Is there any swelling or redness of the knee?"     Yes 9. OTHER SYMPTOMS: "Do you have any other symptoms?" (e.g., chest pain, difficulty breathing, fever, calf pain)     No 10. PREGNANCY: "Is there any chance you are pregnant?" "When was your last menstrual period?"       No  Protocols used: Knee Pain-A-AH

## 2021-12-26 NOTE — Patient Instructions (Incomplete)

## 2021-12-28 ENCOUNTER — Encounter: Payer: Medicaid Other | Admitting: Nurse Practitioner

## 2022-01-03 NOTE — Patient Instructions (Incomplete)

## 2022-01-05 ENCOUNTER — Ambulatory Visit: Payer: Commercial Managed Care - HMO | Admitting: Nurse Practitioner

## 2022-01-05 DIAGNOSIS — R7989 Other specified abnormal findings of blood chemistry: Secondary | ICD-10-CM

## 2022-01-05 DIAGNOSIS — N1831 Chronic kidney disease, stage 3a: Secondary | ICD-10-CM

## 2022-01-05 DIAGNOSIS — I10 Essential (primary) hypertension: Secondary | ICD-10-CM

## 2022-01-05 DIAGNOSIS — F1410 Cocaine abuse, uncomplicated: Secondary | ICD-10-CM

## 2022-01-12 ENCOUNTER — Ambulatory Visit: Payer: Medicaid Other | Admitting: Nurse Practitioner

## 2022-01-18 ENCOUNTER — Ambulatory Visit: Payer: Medicaid Other

## 2022-04-06 ENCOUNTER — Ambulatory Visit: Payer: Medicaid Other | Admitting: Nurse Practitioner

## 2022-04-15 ENCOUNTER — Encounter: Payer: Self-pay | Admitting: Nurse Practitioner

## 2022-07-06 ENCOUNTER — Telehealth: Payer: Self-pay

## 2022-07-06 NOTE — Telephone Encounter (Signed)
Chart review completed for patient. Patient is due for screening mammogram. Mychart message sent to patient to inquire about scheduling mammogram.  Jerrold Haskell, Population Health Specialist.  

## 2022-09-06 ENCOUNTER — Encounter: Payer: Medicaid Other | Admitting: Nurse Practitioner

## 2022-10-08 NOTE — Patient Instructions (Signed)

## 2022-10-11 ENCOUNTER — Encounter: Payer: Self-pay | Admitting: Nurse Practitioner

## 2022-10-11 ENCOUNTER — Ambulatory Visit (INDEPENDENT_AMBULATORY_CARE_PROVIDER_SITE_OTHER): Payer: Medicaid Other | Admitting: Nurse Practitioner

## 2022-10-11 DIAGNOSIS — R7989 Other specified abnormal findings of blood chemistry: Secondary | ICD-10-CM

## 2022-10-11 DIAGNOSIS — F319 Bipolar disorder, unspecified: Secondary | ICD-10-CM

## 2022-10-11 DIAGNOSIS — E559 Vitamin D deficiency, unspecified: Secondary | ICD-10-CM

## 2022-10-11 DIAGNOSIS — N1831 Chronic kidney disease, stage 3a: Secondary | ICD-10-CM

## 2022-10-11 DIAGNOSIS — D508 Other iron deficiency anemias: Secondary | ICD-10-CM

## 2022-10-11 DIAGNOSIS — F1410 Cocaine abuse, uncomplicated: Secondary | ICD-10-CM

## 2022-10-11 DIAGNOSIS — I10 Essential (primary) hypertension: Secondary | ICD-10-CM

## 2022-10-11 DIAGNOSIS — R7301 Impaired fasting glucose: Secondary | ICD-10-CM

## 2022-10-11 NOTE — Progress Notes (Signed)
Patient left before visit and did not reschedule.

## 2023-01-25 ENCOUNTER — Telehealth: Payer: Self-pay | Admitting: Nurse Practitioner

## 2023-01-25 NOTE — Telephone Encounter (Signed)
Please call and schedule the patient an appointment per Jolene. Will place form in the incomplete bin until appointment.

## 2023-01-25 NOTE — Telephone Encounter (Signed)
Called pt to schedule an appt. unable to leave message.

## 2023-01-25 NOTE — Telephone Encounter (Signed)
Patient dropped off document  Plasma form completion , to be filled out by provider. Patient requested to send it back via Call Patient to pick up within 5-days. Document is located in providers folder. Please advise at Mobile [phone] (mobile)

## 2023-01-27 ENCOUNTER — Ambulatory Visit: Payer: Medicaid Other | Admitting: Pediatrics

## 2023-01-30 ENCOUNTER — Ambulatory Visit (INDEPENDENT_AMBULATORY_CARE_PROVIDER_SITE_OTHER): Payer: Medicaid Other | Admitting: Nurse Practitioner

## 2023-01-30 ENCOUNTER — Encounter: Payer: Self-pay | Admitting: Nurse Practitioner

## 2023-01-30 ENCOUNTER — Telehealth: Payer: Self-pay | Admitting: Nurse Practitioner

## 2023-01-30 VITALS — BP 155/90 | HR 84 | Temp 98.7°F | Ht 64.2 in | Wt 153.4 lb

## 2023-01-30 DIAGNOSIS — N1831 Chronic kidney disease, stage 3a: Secondary | ICD-10-CM | POA: Diagnosis not present

## 2023-01-30 DIAGNOSIS — F1410 Cocaine abuse, uncomplicated: Secondary | ICD-10-CM | POA: Diagnosis not present

## 2023-01-30 DIAGNOSIS — I10 Essential (primary) hypertension: Secondary | ICD-10-CM

## 2023-01-30 DIAGNOSIS — R7989 Other specified abnormal findings of blood chemistry: Secondary | ICD-10-CM | POA: Diagnosis not present

## 2023-01-30 DIAGNOSIS — R7301 Impaired fasting glucose: Secondary | ICD-10-CM | POA: Diagnosis not present

## 2023-01-30 DIAGNOSIS — E559 Vitamin D deficiency, unspecified: Secondary | ICD-10-CM

## 2023-01-30 DIAGNOSIS — E063 Autoimmune thyroiditis: Secondary | ICD-10-CM | POA: Insufficient documentation

## 2023-01-30 DIAGNOSIS — F319 Bipolar disorder, unspecified: Secondary | ICD-10-CM

## 2023-01-30 DIAGNOSIS — D508 Other iron deficiency anemias: Secondary | ICD-10-CM

## 2023-01-30 LAB — MICROALBUMIN, URINE WAIVED
Creatinine, Urine Waived: 100 mg/dL (ref 10–300)
Microalb, Ur Waived: 80 mg/L — ABNORMAL HIGH (ref 0–19)

## 2023-01-30 LAB — BAYER DCA HB A1C WAIVED: HB A1C (BAYER DCA - WAIVED): 6.5 % — ABNORMAL HIGH (ref 4.8–5.6)

## 2023-01-30 MED ORDER — FERROUS SULFATE 325 (65 FE) MG PO TABS: 325.0000 mg | ORAL_TABLET | Freq: Every day | ORAL | 4 refills | Status: DC

## 2023-01-30 MED ORDER — HYDROCHLOROTHIAZIDE 25 MG PO TABS: 25.0000 mg | ORAL_TABLET | Freq: Every day | ORAL | 0 refills | Status: DC

## 2023-01-30 MED ORDER — VALSARTAN 160 MG PO TABS: 160.0000 mg | ORAL_TABLET | Freq: Every day | ORAL | 0 refills | Status: DC

## 2023-01-30 MED ORDER — VITAMIN D (ERGOCALCIFEROL) 1.25 MG (50000 UNIT) PO CAPS: 50000.0000 [IU] | ORAL_CAPSULE | ORAL | 3 refills | Status: DC

## 2023-01-30 NOTE — Assessment & Plan Note (Signed)
Have recommended complete cessation and discussed at length risks with HTN and CKD, to include stroke or MI if continues use of this and does not take her medications as ordered.  Refuses rehab or group therapy.  She reports not using, however suspect that still may be using.

## 2023-01-30 NOTE — Progress Notes (Signed)
BP (!) 155/90 (BP Location: Left Arm, Cuff Size: Normal)   Pulse 84   Temp 98.7 F (37.1 C) (Oral)   Ht 5' 4.2" (1.631 m)   Wt 153 lb 6.4 oz (69.6 kg)   SpO2 98%   BMI 26.17 kg/m    Subjective:    Patient ID: Paula Mullins, female    DOB: 1971/01/02, 53 y.o.   MRN: [id number]  HPI: Paula Mullins is a 53 y.o. female  Chief Complaint  Patient presents with   Hypertension   Has missed all visits since December 2023, no show or cancel.  Asked today if she could come back next week for labs to which provider told her no they need to be done today to get refills, as unsure she would return next week.  She did agree to labs today after discussion.  HYPERTENSION with Chronic Kidney Disease Has not been taking medications: HCTZ, Valsartan.  Has not been taking since December 2023, was trying to control herself.  Denies any recent cocaine or drug use. Does continue to smoke.  Needs form completed for plasma donation -- had elevated BP there. Hypertension status: uncontrolled  Satisfied with current treatment? yes Duration of hypertension: chronic BP monitoring frequency:  not checking BP range:  BP medication side effects:  no Medication compliance: poor compliance Aspirin: no Recurrent headaches:  every morning recently Visual changes: no Palpitations: no Dyspnea: no Chest pain: no Lower extremity edema: no Dizzy/lightheaded: no   CHRONIC KIDNEY DISEASE Noted past labs.  Also noted anemia and Vitamin D deficiency, not taking supplements at present. CKD status: stable Medications renally dose: yes Previous renal evaluation: no Pneumovax:  Not up to Date Influenza Vaccine:  Up to Date   Impaired Fasting Glucose Noted on past labs. HbA1C:  Lab Results  Component Value Date   HGBA1C 5.4 10/26/2020  Duration of elevated blood sugar: years Polydipsia: no Polyuria: no Weight change: no Visual disturbance: no Glucose Monitoring: no    Accucheck frequency: Not  Checking    Fasting glucose:     Post prandial:  Diabetic Education: Not Completed Family history of diabetes: no   ELEVATED TSH Noticed to be elevated. Has thyroid disease in family. Fatigue: no Cold intolerance: no Heat intolerance: no Weight gain: no Weight loss: no Constipation: no Diarrhea/loose stools: no Palpitations: no Lower extremity edema: no Anxiety/depressed mood: no   BIPOLAR DISORDER Previously took medication for this, none present. Mood status: stable Satisfied with current treatment?: yes Symptom severity: mild Psychotherapy/counseling: yes in the past Depressed mood: no Anxious mood: no Anhedonia: no Significant weight loss or gain: no Insomnia: no none Fatigue: no Feelings of worthlessness or guilt: no Impaired concentration/indecisiveness: no Suicidal ideations: no Hopelessness: no Crying spells: no    01/30/2023   11:32 AM 12/07/2021    9:26 AM 02/28/2020   11:12 AM 08/10/2018    2:27 PM 02/03/2017   11:47 AM  Depression screen PHQ 2/9  Decreased Interest 0 0 0 0 2  Down, Depressed, Hopeless 0 0 0 0 3  PHQ - 2 Score 0 0 0 0 5  Altered sleeping 3 2 3 2 3   Tired, decreased energy 0 2 3 2 2   Change in appetite 0 0 0 0 1  Feeling bad or failure about yourself  0 0 0 0 2  Trouble concentrating 0 0 0 0 0  Moving slowly or fidgety/restless 0 0 0 0 0  Suicidal thoughts 0 0 0 0 0  PHQ-9 Score 3 4 6 4 13   Difficult doing work/chores Extremely dIfficult Not difficult at all Somewhat difficult Not difficult at all        01/30/2023   11:32 AM 12/07/2021    9:27 AM 08/10/2018    2:28 PM 02/03/2017   11:48 AM  GAD 7 : Generalized Anxiety Score  Nervous, Anxious, on Edge 0 0 1 1  Control/stop worrying 0 3 1 1   Worry too much - different things 0 3 1 1   Trouble relaxing 0 0 0 2  Restless 0 0 0 1  Easily annoyed or irritable 0 2 1 0  Afraid - awful might happen 0 0 0 1  Total GAD 7 Score 0 8 4 7   Anxiety Difficulty Not difficult at all Not difficult at  all Not difficult at all    Relevant past medical, surgical, family and social history reviewed and updated as indicated. Interim medical history since our last visit reviewed. Allergies and medications reviewed and updated.  Review of Systems  Constitutional:  Negative for activity change, appetite change, diaphoresis, fatigue and fever.  Respiratory:  Negative for cough, chest tightness and shortness of breath.   Cardiovascular:  Negative for chest pain, palpitations and leg swelling.  Gastrointestinal: Negative.   Endocrine: Negative for cold intolerance, heat intolerance, polydipsia, polyphagia and polyuria.  Neurological:  Positive for headaches. Negative for dizziness, syncope, weakness, light-headedness and numbness.  Psychiatric/Behavioral: Negative.      Per HPI unless specifically indicated above     Objective:    BP (!) 155/90 (BP Location: Left Arm, Cuff Size: Normal)   Pulse 84   Temp 98.7 F (37.1 C) (Oral)   Ht 5' 4.2" (1.631 m)   Wt 153 lb 6.4 oz (69.6 kg)   SpO2 98%   BMI 26.17 kg/m   Wt Readings from Last 3 Encounters:  01/30/23 153 lb 6.4 oz (69.6 kg)  12/07/21 148 lb 9.6 oz (67.4 kg)  03/22/21 140 lb (63.5 kg)    Physical Exam Vitals and nursing note reviewed.  Constitutional:      General: She is awake. She is not in acute distress.    Appearance: She is well-developed and well-groomed. She is not ill-appearing or toxic-appearing.  HENT:     Head: Normocephalic.     Right Ear: Hearing and external ear normal.     Left Ear: Hearing and external ear normal.  Eyes:     General: Lids are normal.        Right eye: No discharge.        Left eye: No discharge.     Conjunctiva/sclera: Conjunctivae normal.     Pupils: Pupils are equal, round, and reactive to light.  Neck:     Thyroid: No thyromegaly.     Vascular: No carotid bruit.  Cardiovascular:     Rate and Rhythm: Normal rate and regular rhythm.     Heart sounds: Normal heart sounds. No murmur  heard.    No gallop.  Pulmonary:     Effort: Pulmonary effort is normal. No accessory muscle usage or respiratory distress.     Breath sounds: Normal breath sounds.  Abdominal:     General: Bowel sounds are normal. There is no distension.     Palpations: Abdomen is soft.     Tenderness: There is no abdominal tenderness.  Musculoskeletal:     Cervical back: Normal range of motion and neck supple.     Right lower leg: No edema.  Left lower leg: No edema.  Lymphadenopathy:     Cervical: No cervical adenopathy.  Skin:    General: Skin is warm and dry.  Neurological:     Mental Status: She is alert and oriented to person, place, and time.     Deep Tendon Reflexes: Reflexes are normal and symmetric.     Reflex Scores:      Brachioradialis reflexes are 2+ on the right side and 2+ on the left side.      Patellar reflexes are 2+ on the right side and 2+ on the left side. Psychiatric:        Attention and Perception: Attention normal.        Mood and Affect: Mood normal.        Speech: Speech normal.        Behavior: Behavior normal. Behavior is cooperative.        Thought Content: Thought content normal.     Comments: Fidgeting often in chair.    Results for orders placed or performed in visit on 12/07/21  CBC with Differential/Platelet   Collection Time: 12/07/21  9:19 AM  Result Value Ref Range   WBC 4.8 3.4 - 10.8 x10E3/uL   RBC 4.30 3.77 - 5.28 x10E6/uL   Hemoglobin 12.7 11.1 - 15.9 g/dL   Hematocrit 16.1 09.6 - 46.6 %   MCV 88 79 - 97 fL   MCH 29.5 26.6 - 33.0 pg   MCHC 33.4 31.5 - 35.7 g/dL   RDW 04.5 40.9 - 81.1 %   Platelets 240 150 - 450 x10E3/uL   Neutrophils 64 Not Estab. %   Lymphs 25 Not Estab. %   Monocytes 8 Not Estab. %   Eos 2 Not Estab. %   Basos 1 Not Estab. %   Neutrophils Absolute 3.1 1.4 - 7.0 x10E3/uL   Lymphocytes Absolute 1.2 0.7 - 3.1 x10E3/uL   Monocytes Absolute 0.4 0.1 - 0.9 x10E3/uL   EOS (ABSOLUTE) 0.1 0.0 - 0.4 x10E3/uL   Basophils  Absolute 0.0 0.0 - 0.2 x10E3/uL   Immature Granulocytes 0 Not Estab. %   Immature Grans (Abs) 0.0 0.0 - 0.1 x10E3/uL  Comprehensive metabolic panel   Collection Time: 12/07/21  9:19 AM  Result Value Ref Range   Glucose 154 (H) 70 - 99 mg/dL   BUN 21 6 - 24 mg/dL   Creatinine, Ser 9.14 (H) 0.57 - 1.00 mg/dL   eGFR 53 (L) >78 GN/FAO/1.30   BUN/Creatinine Ratio 17 9 - 23   Sodium 142 134 - 144 mmol/L   Potassium 3.7 3.5 - 5.2 mmol/L   Chloride 102 96 - 106 mmol/L   CO2 25 20 - 29 mmol/L   Calcium 9.9 8.7 - 10.2 mg/dL   Total Protein 7.2 6.0 - 8.5 g/dL   Albumin 4.4 3.8 - 4.9 g/dL   Globulin, Total 2.8 1.5 - 4.5 g/dL   Albumin/Globulin Ratio 1.6 1.2 - 2.2   Bilirubin Total 0.9 0.0 - 1.2 mg/dL   Alkaline Phosphatase 79 44 - 121 IU/L   AST 17 0 - 40 IU/L   ALT 15 0 - 32 IU/L  TSH   Collection Time: 12/07/21  9:19 AM  Result Value Ref Range   TSH 6.420 (H) 0.450 - 4.500 uIU/mL  Lipid Panel w/o Chol/HDL Ratio   Collection Time: 12/07/21  9:19 AM  Result Value Ref Range   Cholesterol, Total 195 100 - 199 mg/dL   Triglycerides 865 0 - 149 mg/dL   HDL  83 >39 mg/dL   VLDL Cholesterol Cal 24 5 - 40 mg/dL   LDL Chol Calc (NIH) 88 0 - 99 mg/dL  VITAMIN D 25 Hydroxy (Vit-D Deficiency, Fractures)   Collection Time: 12/07/21  9:19 AM  Result Value Ref Range   Vit D, 25-Hydroxy 21.0 (L) 30.0 - 100.0 ng/mL      Assessment & Plan:   Problem List Items Addressed This Visit       Cardiovascular and Mediastinum   Essential hypertension   Chronic, uncontrolled. Has not been taking medication since December 2023.  Reports she wanted to control on her own, but concerned she is still using cocaine.  Will send in refills for 90 days + one refill, but will need to return for 5 week follow-up and then a 6 month follow-up for further refills.  Discussed at length with her.  Recommended she completely sustain from cocaine use and educated her on the high risks if continues use: stroke or MI.   Recommend no ibuprofen use at home.  Recommend she monitor BP at least a few mornings a week at home and document.  DASH diet at home, reports eating a lot of salt.  Labs today: CBC, CMP, TSH, urine ALB.  Return in 5 weeks. - Completed plasma donation form, but reported she can donated only if taking medications consistently and BP <140/90       Relevant Medications   valsartan (DIOVAN) 160 MG tablet   hydrochlorothiazide (HYDRODIURIL) 25 MG tablet   Other Relevant Orders   Lipid Panel w/o Chol/HDL Ratio   Microalbumin, Urine Waived     Endocrine   IFG (impaired fasting glucose)   Noted on past labs, check A1c today and urine ALB.  Recommend heavy focus on healthy diet changes at home.  Start medication as needed.      Relevant Orders   Bayer DCA Hb A1c Waived     Genitourinary   CKD (chronic kidney disease) stage 3, GFR 30-59 ml/min (HCC) - Primary   Noted on past labs, recheck today.  Recommend she take BP medications daily as ordered and discussed with her risk to kidneys if she does not.  Also discussed importance of complete cessation of cocaine use, as concerned she continues to use.  Ensure plenty of water intake daily and avoid Ibuprofen products.  Labs today.      Relevant Orders   Comprehensive metabolic panel     Other   Bipolar I disorder (HCC)   Chronic, ongoing.  No current medications, refuses.  Denies SI/HI.  Have discussed with patient at length that if more intense therapy is required will place psychiatry referral, recommended she start back at [facility] and also obtain therapy, refuses.        Cocaine abuse (HCC)   Have recommended complete cessation and discussed at length risks with HTN and CKD, to include stroke or MI if continues use of this and does not take her medications as ordered.  Refuses rehab or group therapy.  She reports not using, however suspect that still may be using.      Elevated TSH   Noted on labs December 2023, but did not return for recheck  as recommended.  Family history of thyroid disease.  Recheck labs today and start medication as needed.  Discussed with patient and educated her on this.      Relevant Orders   TSH   T4, free   Thyroid peroxidase antibody   Iron deficiency anemia  Ongoing in past, not taking supplements at present.  Recheck levels today and send refills on supplement.  Discussed with her.  She does have heavier cycles.      Relevant Medications   ferrous sulfate 325 (65 FE) MG tablet   Other Relevant Orders   CBC with Differential/Platelet   Ferritin   Iron   Vitamin D deficiency   Ongoing on past labs, recommend she take supplement as ordered.  Refills sent.  Labs today.      Relevant Orders   VITAMIN D 25 Hydroxy (Vit-D Deficiency, Fractures)     Follow up plan: Return in about 5 weeks (around 03/06/2023) for HTN.

## 2023-01-30 NOTE — Assessment & Plan Note (Signed)
Chronic, ongoing.  No current medications, refuses.  Denies SI/HI.  Have discussed with patient at length that if more intense therapy is required will place psychiatry referral, recommended she start back at [facility] and also obtain therapy, refuses.

## 2023-01-30 NOTE — Assessment & Plan Note (Signed)
Chronic, uncontrolled. Has not been taking medication since December 2023.  Reports she wanted to control on her own, but concerned she is still using cocaine.  Will send in refills for 90 days + one refill, but will need to return for 5 week follow-up and then a 6 month follow-up for further refills.  Discussed at length with her.  Recommended she completely sustain from cocaine use and educated her on the high risks if continues use: stroke or MI.  Recommend no ibuprofen use at home.  Recommend she monitor BP at least a few mornings a week at home and document.  DASH diet at home, reports eating a lot of salt.  Labs today: CBC, CMP, TSH, urine ALB.  Return in 5 weeks. - Completed plasma donation form, but reported she can donated only if taking medications consistently and BP <140/90

## 2023-01-30 NOTE — Patient Instructions (Signed)

## 2023-01-30 NOTE — Assessment & Plan Note (Signed)
Noted on past labs, check A1c today and urine ALB.  Recommend heavy focus on healthy diet changes at home.  Start medication as needed.

## 2023-01-30 NOTE — Assessment & Plan Note (Signed)
Noted on labs December 2023, but did not return for recheck as recommended.  Family history of thyroid disease.  Recheck labs today and start medication as needed.  Discussed with patient and educated her on this.

## 2023-01-30 NOTE — Assessment & Plan Note (Signed)
Ongoing in past, not taking supplements at present.  Recheck levels today and send refills on supplement.  Discussed with her.  She does have heavier cycles.

## 2023-01-30 NOTE — Assessment & Plan Note (Signed)
Ongoing on past labs, recommend she take supplement as ordered.  Refills sent.  Labs today.

## 2023-01-30 NOTE — Assessment & Plan Note (Signed)
Noted on past labs, recheck today.  Recommend she take BP medications daily as ordered and discussed with her risk to kidneys if she does not.  Also discussed importance of complete cessation of cocaine use, as concerned she continues to use.  Ensure plenty of water intake daily and avoid Ibuprofen products.  Labs today.

## 2023-01-30 NOTE — Telephone Encounter (Signed)
Patient dropped off document  Request for Medical Consultation , to be filled out by provider. Patient requested to send it back via Call Patient to pick up within 5-days. Document is located in providers folder. Please advise at Mobile [phone] (mobile)

## 2023-01-31 ENCOUNTER — Telehealth: Payer: Self-pay | Admitting: Nurse Practitioner

## 2023-01-31 ENCOUNTER — Encounter: Payer: Self-pay | Admitting: Nurse Practitioner

## 2023-01-31 LAB — COMPREHENSIVE METABOLIC PANEL
ALT: 15 [IU]/L (ref 0–32)
AST: 19 [IU]/L (ref 0–40)
Albumin: 4.5 g/dL (ref 3.8–4.9)
Alkaline Phosphatase: 80 [IU]/L (ref 44–121)
BUN/Creatinine Ratio: 10 (ref 9–23)
BUN: 13 mg/dL (ref 6–24)
Bilirubin Total: 0.7 mg/dL (ref 0.0–1.2)
CO2: 23 mmol/L (ref 20–29)
Calcium: 9.4 mg/dL (ref 8.7–10.2)
Chloride: 104 mmol/L (ref 96–106)
Creatinine, Ser: 1.27 mg/dL — ABNORMAL HIGH (ref 0.57–1.00)
Globulin, Total: 2.5 g/dL (ref 1.5–4.5)
Glucose: 124 mg/dL — ABNORMAL HIGH (ref 70–99)
Potassium: 3.9 mmol/L (ref 3.5–5.2)
Sodium: 144 mmol/L (ref 134–144)
Total Protein: 7 g/dL (ref 6.0–8.5)
eGFR: 50 mL/min/{1.73_m2} — ABNORMAL LOW (ref >59)

## 2023-01-31 LAB — CBC WITH DIFFERENTIAL/PLATELET
Basophils Absolute: 0 10*3/uL (ref 0.0–0.2)
Basos: 1 %
EOS (ABSOLUTE): 0.1 10*3/uL (ref 0.0–0.4)
Eos: 3 %
Hematocrit: 39.9 % (ref 34.0–46.6)
Hemoglobin: 13.1 g/dL (ref 11.1–15.9)
Immature Grans (Abs): 0 10*3/uL (ref 0.0–0.1)
Immature Granulocytes: 0 %
Lymphocytes Absolute: 0.8 10*3/uL (ref 0.7–3.1)
Lymphs: 19 %
MCH: 29.2 pg (ref 26.6–33.0)
MCHC: 32.8 g/dL (ref 31.5–35.7)
MCV: 89 fL (ref 79–97)
Monocytes Absolute: 0.6 10*3/uL (ref 0.1–0.9)
Monocytes: 13 %
Neutrophils Absolute: 2.7 10*3/uL (ref 1.4–7.0)
Neutrophils: 64 %
Platelets: 191 10*3/uL (ref 150–450)
RBC: 4.49 x10E6/uL (ref 3.77–5.28)
RDW: 13 % (ref 11.7–15.4)
WBC: 4.2 10*3/uL (ref 3.4–10.8)

## 2023-01-31 LAB — LIPID PANEL W/O CHOL/HDL RATIO
Cholesterol, Total: 193 mg/dL (ref 100–199)
HDL: 87 mg/dL (ref >39)
LDL Chol Calc (NIH): 87 mg/dL (ref 0–99)
Triglycerides: 107 mg/dL (ref 0–149)
VLDL Cholesterol Cal: 19 mg/dL (ref 5–40)

## 2023-01-31 LAB — FERRITIN: Ferritin: 101 ng/mL (ref 15–150)

## 2023-01-31 LAB — VITAMIN D 25 HYDROXY (VIT D DEFICIENCY, FRACTURES): Vit D, 25-Hydroxy: 19.4 ng/mL — ABNORMAL LOW (ref 30.0–100.0)

## 2023-01-31 LAB — THYROID PEROXIDASE ANTIBODY: Thyroperoxidase Ab SerPl-aCnc: 126 [IU]/mL — ABNORMAL HIGH (ref 0–34)

## 2023-01-31 LAB — T4, FREE: Free T4: 1.01 ng/dL (ref 0.82–1.77)

## 2023-01-31 LAB — IRON: Iron: 38 ug/dL (ref 27–159)

## 2023-01-31 LAB — TSH: TSH: 3.23 u[IU]/mL (ref 0.450–4.500)

## 2023-01-31 NOTE — Telephone Encounter (Signed)
Pt. Given lab results and instructions. Verbalizes understanding. She agrees with starting thyroid medication. Asking for samples.Please advise pt. States she is concerned "about the paper Paula Mullins is filling out for me , if she will do it right."

## 2023-01-31 NOTE — Telephone Encounter (Signed)
Patient called and requested the her daughter Paula Mullins pick up paperwork completed by Paula Mullins.  Verified patient's name, dob, and address. Patient states that she is sick and unable to come pick up paperwork herself. Informed patient paperwork will be given to patient's daughter as requested.

## 2023-01-31 NOTE — Progress Notes (Signed)
Contacted via MyChart, however please call as does not consistently check this on review: Good morning Paula Mullins, your labs have returned and there are some abnormal findings to discuss: - Kidney function, creatinine and eGFR, continues to show Stage 3a kidney disease.  Overtime this can progress and worsen if blood pressure not well controlled, please ensure to take medication daily and attend all provider visits.  Liver function is normal. - Vitamin D level remains a little low, I do recommend taking weekly supplement as ordered for overall bone health. - A1c, diabetes testing, has trended up to the cusp of Type 2 Diabetes at 6.5%.  Any number 6.5% or greater is considered diabetes, however we can at this time focus on diet and regular activity.  This means cutting back on foods high in sugar and carbohydrates.  I do want you to recheck this in 3 months at a visit, which we can schedule in March when you come.  It is important to recheck this as in future if continues to remain 6.5% or greater we will need to start medication for this. - CBC shows improved anemia, but iron remains on low end of normal.  I do recommend you continue iron supplement daily.  I sent refills yesterday. - Thyroid levels show trend down in TSH, but thyroid antibody testing is elevated pointing more towards Hashimoto's Disease.  This is an autoimmune disease where your body does not recognize your thyroid an attacks it, causing inflammation in thyroid and thyroid to be sluggish.  I would recommend starting a low dose of Levothyroxine 25 MCG daily yo help improve hormone levels and thyroid health.  We would then recheck labs at March visit.  Any questions?  Are you okay with starting Levothyroxine?  Any questions about diabetes and A1c?   Keep being stellar!!  Thank you for allowing me to participate in your care.  I appreciate you. Kindest regards, Myron Stankovich

## 2023-01-31 NOTE — Telephone Encounter (Signed)
Paperwork completed, signed, and placed up front for patient to pick up. Copy placed in scan bin.   Called and notified patient that her form was ready to be picked up.

## 2023-01-31 NOTE — Telephone Encounter (Signed)
Paperwork started. Placed in providers folder for completion and signature.

## 2023-02-01 NOTE — Telephone Encounter (Signed)
Complete

## 2023-02-02 ENCOUNTER — Telehealth: Payer: Self-pay | Admitting: Nurse Practitioner

## 2023-02-02 NOTE — Telephone Encounter (Signed)
Copied from CRM [id number]. Topic: General - Other >> Feb 01, 2023 11:41 AM Everette C wrote: Reason for CRM: The patient has called to request a printed copy of their labs from 01/30/23  Please contact the patient further if needed

## 2023-02-02 NOTE — Telephone Encounter (Signed)
Labs from date requested have been printed and mailed to patient

## 2023-03-06 ENCOUNTER — Encounter: Payer: Self-pay | Admitting: Nurse Practitioner

## 2023-03-06 ENCOUNTER — Ambulatory Visit: Payer: Self-pay | Admitting: Nurse Practitioner

## 2023-03-06 ENCOUNTER — Other Ambulatory Visit: Payer: Self-pay

## 2023-03-06 ENCOUNTER — Telehealth: Payer: Self-pay

## 2023-03-06 VITALS — BP 146/92 | HR 65 | Temp 97.4°F | Ht 64.0 in | Wt 150.2 lb

## 2023-03-06 DIAGNOSIS — F1410 Cocaine abuse, uncomplicated: Secondary | ICD-10-CM | POA: Diagnosis not present

## 2023-03-06 DIAGNOSIS — E063 Autoimmune thyroiditis: Secondary | ICD-10-CM | POA: Diagnosis not present

## 2023-03-06 DIAGNOSIS — R7309 Other abnormal glucose: Secondary | ICD-10-CM

## 2023-03-06 DIAGNOSIS — I10 Essential (primary) hypertension: Secondary | ICD-10-CM

## 2023-03-06 DIAGNOSIS — N1831 Chronic kidney disease, stage 3a: Secondary | ICD-10-CM

## 2023-03-06 MED ORDER — FERROUS SULFATE 325 (65 FE) MG PO TABS
325.0000 mg | ORAL_TABLET | Freq: Every day | ORAL | 4 refills | Status: AC
  Filled 2023-03-06: qty 90, 90d supply, fill #0

## 2023-03-06 MED ORDER — VITAMIN D (ERGOCALCIFEROL) 1.25 MG (50000 UNIT) PO CAPS
50000.0000 [IU] | ORAL_CAPSULE | ORAL | 3 refills | Status: AC
  Filled 2023-03-06: qty 4, 28d supply, fill #0

## 2023-03-06 MED ORDER — LEVOTHYROXINE SODIUM 25 MCG PO TABS
25.0000 ug | ORAL_TABLET | Freq: Every day | ORAL | 2 refills | Status: AC
  Filled 2023-03-06: qty 60, 60d supply, fill #0

## 2023-03-06 MED ORDER — VALSARTAN 160 MG PO TABS
160.0000 mg | ORAL_TABLET | Freq: Every day | ORAL | 0 refills | Status: AC
  Filled 2023-03-06: qty 90, 90d supply, fill #0

## 2023-03-06 MED ORDER — HYDROCHLOROTHIAZIDE 25 MG PO TABS
25.0000 mg | ORAL_TABLET | Freq: Every day | ORAL | 0 refills | Status: AC
  Filled 2023-03-06: qty 90, 90d supply, fill #0

## 2023-03-06 NOTE — Assessment & Plan Note (Signed)
 Ongoing.  Recommend she take BP medications daily as ordered and discussed with her risk to kidneys if she does not.  Also discussed importance of complete cessation of cocaine use, as concerned she continues to use.  Ensure plenty of water intake daily and avoid Ibuprofen products.  Labs up to date.

## 2023-03-06 NOTE — Patient Instructions (Signed)
 [facility] Call [phone] to request a refill.  Heart-Healthy Eating Plan Many factors influence your heart health, including eating and exercise habits. Heart health is also called coronary health. Coronary risk increases with abnormal blood fat (lipid) levels. A heart-healthy eating plan includes limiting unhealthy fats, increasing healthy fats, limiting salt (sodium) intake, and making other diet and lifestyle changes. What is my plan? Your health care provider may recommend that: You limit your fat intake to _________% or less of your total calories each day. You limit your saturated fat intake to _________% or less of your total calories each day. You limit the amount of cholesterol in your diet to less than _________ mg per day. You limit the amount of sodium in your diet to less than _________ mg per day. What are tips for following this plan? Cooking Cook foods using methods other than frying. Baking, boiling, grilling, and broiling are all good options. Other ways to reduce fat include: Removing the skin from poultry. Removing all visible fats from meats. Steaming vegetables in water or broth. Meal planning  At meals, imagine dividing your plate into fourths: Fill one-half of your plate with vegetables and green salads. Fill one-fourth of your plate with whole grains. Fill one-fourth of your plate with lean protein foods. Eat 2-4 cups of vegetables per day. One cup of vegetables equals 1 cup (91 g) broccoli or cauliflower florets, 2 medium carrots, 1 large bell pepper, 1 large sweet potato, 1 large tomato, 1 medium white potato, 2 cups (150 g) raw leafy greens. Eat 1-2 cups of fruit per day. One cup of fruit equals 1 small apple, 1 large banana, 1 cup (237 g) mixed fruit, 1 large orange,  cup (82 g) dried fruit, 1 cup (240 mL) 100% fruit juice. Eat more foods that contain soluble fiber. Examples include apples, broccoli, carrots, beans, peas, and barley.  Aim to get 25-30 g of fiber per day. Increase your consumption of legumes, nuts, and seeds to 4-5 servings per week. One serving of dried beans or legumes equals  cup (90 g) cooked, 1 serving of nuts is  oz (12 almonds, 24 pistachios, or 7 walnut halves), and 1 serving of seeds equals  oz (8 g). Fats Choose healthy fats more often. Choose monounsaturated and polyunsaturated fats, such as olive and canola oils, avocado oil, flaxseeds, walnuts, almonds, and seeds. Eat more omega-3 fats. Choose salmon, mackerel, sardines, tuna, flaxseed oil, and ground flaxseeds. Aim to eat fish at least 2 times each week. Check food labels carefully to identify foods with trans fats or high amounts of saturated fat. Limit saturated fats. These are found in animal products, such as meats, butter, and cream. Plant sources of saturated fats include palm oil, palm kernel oil, and coconut oil. Avoid foods with partially hydrogenated oils in them. These contain trans fats. Examples are stick margarine, some tub margarines, cookies, crackers, and other baked goods. Avoid fried foods. General information Eat more home-cooked food and less restaurant, buffet, and fast food. Limit or avoid alcohol. Limit foods that are high in added sugar and simple starches such as foods made using white refined flour (white breads, pastries, sweets). Lose weight if you are overweight. Losing just 5-10% of your body weight can help your overall health and prevent diseases such as diabetes and heart disease. Monitor your sodium intake, especially if you have high blood pressure. Talk with your health care provider about your sodium intake. Try to incorporate more vegetarian meals weekly.  What foods should I eat? Fruits All fresh, canned (in natural juice), or frozen fruits. Vegetables Fresh or frozen vegetables (raw, steamed, roasted, or grilled). Green salads. Grains Most grains. Choose whole wheat and whole grains most of the time.  Rice and pasta, including brown rice and pastas made with whole wheat. Meats and other proteins Lean, well-trimmed beef, veal, pork, and lamb. Chicken and [place] without skin. All fish and shellfish. Wild duck, rabbit, pheasant, and venison. Egg whites or low-cholesterol egg substitutes. Dried beans, peas, lentils, and tofu. Seeds and most nuts. Dairy Low-fat or nonfat cheeses, including ricotta and mozzarella. Skim or 1% milk (liquid, powdered, or evaporated). Buttermilk made with low-fat milk. Nonfat or low-fat yogurt. Fats and oils Non-hydrogenated (trans-free) margarines. Vegetable oils, including soybean, sesame, sunflower, olive, avocado, peanut, safflower, corn, canola, and cottonseed. Salad dressings or mayonnaise made with a vegetable oil. Beverages Water (mineral or sparkling). Coffee and tea. Unsweetened ice tea. Diet beverages. Sweets and desserts Sherbet, gelatin, and fruit ice. Small amounts of dark chocolate. Limit all sweets and desserts. Seasonings and condiments All seasonings and condiments. The items listed above may not be a complete list of foods and beverages you can eat. Contact a dietitian for more options. What foods should I avoid? Fruits Canned fruit in heavy syrup. Fruit in cream or butter sauce. Fried fruit. Limit coconut. Vegetables Vegetables cooked in cheese, cream, or butter sauce. Fried vegetables. Grains Breads made with saturated or trans fats, oils, or whole milk. Croissants. Sweet rolls. Donuts. High-fat crackers, such as cheese crackers and chips. Meats and other proteins Fatty meats, such as hot dogs, ribs, sausage, bacon, rib-eye roast or steak. High-fat deli meats, such as salami and bologna. Caviar. Domestic duck and goose. Organ meats, such as liver. Dairy Cream, sour cream, cream cheese, and creamed cottage cheese. Whole-milk cheeses. Whole or 2% milk (liquid, evaporated, or condensed). Whole buttermilk. Cream sauce or high-fat cheese sauce.  Whole-milk yogurt. Fats and oils Meat fat, or shortening. Cocoa butter, hydrogenated oils, palm oil, coconut oil, palm kernel oil. Solid fats and shortenings, including bacon fat, salt pork, lard, and butter. Nondairy cream substitutes. Salad dressings with cheese or sour cream. Beverages Regular sodas and any drinks with added sugar. Sweets and desserts Frosting. Pudding. Cookies. Cakes. Pies. Milk chocolate or white chocolate. Buttered syrups. Full-fat ice cream or ice cream drinks. The items listed above may not be a complete list of foods and beverages to avoid. Contact a dietitian for more information. Summary Heart-healthy meal planning includes limiting unhealthy fats, increasing healthy fats, limiting salt (sodium) intake and making other diet and lifestyle changes. Lose weight if you are overweight. Losing just 5-10% of your body weight can help your overall health and prevent diseases such as diabetes and heart disease. Focus on eating a balance of foods, including fruits and vegetables, low-fat or nonfat dairy, lean protein, nuts and legumes, whole grains, and heart-healthy oils and fats. This information is not intended to replace advice given to you by your health care provider. Make sure you discuss any questions you have with your health care provider. Document Revised: 01/25/2021 Document Reviewed: 01/25/2021 Elsevier Patient Education  2024 [employer].

## 2023-03-06 NOTE — Assessment & Plan Note (Signed)
 Recent A1c with trend up to 6.5% -- will recheck in 3 months and if remains elevated in this range or higher will change to diabetes.  Recommend heavy focus on healthy diet changes at home.  Start medication as needed.

## 2023-03-06 NOTE — Assessment & Plan Note (Signed)
 Ongoing, have recommended we start medication and educated on Hashimoto's.  She reports this runs in family.  Start Levothyroxine 25 MCG daily, educated her on this and disease process.  Repeat labs in 6 weeks.

## 2023-03-06 NOTE — Telephone Encounter (Signed)
 Copied from CRM [id number]. Topic: Clinical - Prescription Issue >> Mar 06, 2023 10:36 AM Elle L wrote: Reason for CRM: The patient is requesting that her prescriptions be sent to [id number] - [place] ([place]), [place] - [address] instead of [facility] as the prescription costs are the same.

## 2023-03-06 NOTE — Progress Notes (Signed)
 BP (!) 146/92 (BP Location: Left Arm, Patient Position: Sitting, Cuff Size: Normal)   Pulse 65   Temp (!) 97.4 F (36.3 C) (Oral)   Ht 5\' 4"  (1.626 m)   Wt 150 lb 3.2 oz (68.1 kg)   LMP  (LMP Unknown)   SpO2 99%   BMI 25.78 kg/m    Subjective:    Patient ID: Paula Mullins, female    DOB: [birth year], 53 y.o.   MRN: [id number]  HPI: Paula Mullins is a 53 y.o. female  Chief Complaint  Patient presents with   Hypertension   HYPERTENSION with Chronic Kidney Disease Follow-up today for HTN, she was restarted on Valsartan and HCTZ last visit, but is not taking due to not having money to afford.  Denies any recent cocaine or drug use, no recent smoking of cocaine.  Ate a lot of salt food last night. Hypertension status: uncontrolled  Satisfied with current treatment? yes Duration of hypertension: chronic BP monitoring frequency:  not checking BP range:  BP medication side effects:  no Medication compliance: poor compliance Aspirin: no Recurrent headaches: no Visual changes: no Palpitations: no Dyspnea: no Chest pain: no Lower extremity edema: no Dizzy/lightheaded: no   CHRONIC KIDNEY DISEASE CKD status: stable Medications renally dose: yes Previous renal evaluation: yes Pneumovax:  Not up to Date Influenza Vaccine:  Not up to Date   Impaired Fasting Glucose HbA1C:  Lab Results  Component Value Date   HGBA1C 6.5 (H) 01/30/2023  Duration of elevated blood sugar:  Polydipsia: no Polyuria: no Weight change: no Visual disturbance: no Glucose Monitoring: no    Accucheck frequency: Not Checking    Fasting glucose:     Post prandial:  Diabetic Education: Not Completed Family history of diabetes: yes   HASHIMOTO'S HYPOTHYROIDISM Noted on recent labs and recommended starting medication.  She did not reply to message on this. Thyroid control status:stable Satisfied with current treatment? yes Medication side effects: no Medication compliance: poor  compliance Etiology of hypothyroidism: Hashimoto's Recent dose adjustment:no Fatigue: yes Cold intolerance: no Heat intolerance: yes Weight gain: no Weight loss: no Constipation: yes Diarrhea/loose stools: yes Palpitations: no Lower extremity edema: no Anxiety/depressed mood: yes    03/06/2023   10:14 AM 01/30/2023   11:32 AM 12/07/2021    9:26 AM 02/28/2020   11:12 AM 08/10/2018    2:27 PM  Depression screen PHQ 2/9  Decreased Interest 2 0 0 0 0  Down, Depressed, Hopeless 1 0 0 0 0  PHQ - 2 Score 3 0 0 0 0  Altered sleeping 2 3 2 3 2   Tired, decreased energy 1 0 2 3 2   Change in appetite 1 0 0 0 0  Feeling bad or failure about yourself  1 0 0 0 0  Trouble concentrating 1 0 0 0 0  Moving slowly or fidgety/restless 1 0 0 0 0  Suicidal thoughts 0 0 0 0 0  PHQ-9 Score 10 3 4 6 4   Difficult doing work/chores Somewhat difficult Extremely dIfficult Not difficult at all Somewhat difficult Not difficult at all       03/06/2023   10:15 AM 01/30/2023   11:32 AM 12/07/2021    9:27 AM 08/10/2018    2:28 PM  GAD 7 : Generalized Anxiety Score  Nervous, Anxious, on Edge 0 0 0 1  Control/stop worrying 0 0 3 1  Worry too much - different things 0 0 3 1  Trouble relaxing 0 0 0 0  Restless 0 0 0 0  Easily annoyed or irritable 0 0 2 1  Afraid - awful might happen 0 0 0 0  Total GAD 7 Score 0 0 8 4  Anxiety Difficulty Not difficult at all Not difficult at all Not difficult at all Not difficult at all       Relevant past medical, surgical, family and social history reviewed and updated as indicated. Interim medical history since our last visit reviewed. Allergies and medications reviewed and updated.  Review of Systems  Constitutional:  Negative for activity change, appetite change, diaphoresis, fatigue and fever.  Respiratory:  Negative for cough, chest tightness and shortness of breath.   Cardiovascular:  Negative for chest pain, palpitations and leg swelling.  Gastrointestinal: Negative.    Endocrine: Negative for cold intolerance, heat intolerance, polydipsia, polyphagia and polyuria.  Neurological:  Negative for dizziness, syncope, weakness, light-headedness, numbness and headaches.  Psychiatric/Behavioral: Negative.      Per HPI unless specifically indicated above     Objective:    BP (!) 146/92 (BP Location: Left Arm, Patient Position: Sitting, Cuff Size: Normal)   Pulse 65   Temp (!) 97.4 F (36.3 C) (Oral)   Ht 5\' 4"  (1.626 m)   Wt 150 lb 3.2 oz (68.1 kg)   LMP  (LMP Unknown)   SpO2 99%   BMI 25.78 kg/m   Wt Readings from Last 3 Encounters:  03/06/23 150 lb 3.2 oz (68.1 kg)  01/30/23 153 lb 6.4 oz (69.6 kg)  12/07/21 148 lb 9.6 oz (67.4 kg)    Physical Exam Vitals and nursing note reviewed.  Constitutional:      General: She is awake. She is not in acute distress.    Appearance: She is well-developed and well-groomed. She is not ill-appearing or toxic-appearing.  HENT:     Head: Normocephalic.     Right Ear: Hearing and external ear normal.     Left Ear: Hearing and external ear normal.  Eyes:     General: Lids are normal.        Right eye: No discharge.        Left eye: No discharge.     Conjunctiva/sclera: Conjunctivae normal.     Pupils: Pupils are equal, round, and reactive to light.  Neck:     Thyroid: No thyromegaly.     Vascular: No carotid bruit.  Cardiovascular:     Rate and Rhythm: Normal rate and regular rhythm.     Heart sounds: Normal heart sounds. No murmur heard.    No gallop.  Pulmonary:     Effort: Pulmonary effort is normal. No accessory muscle usage or respiratory distress.     Breath sounds: Normal breath sounds.  Abdominal:     General: Bowel sounds are normal. There is no distension.     Palpations: Abdomen is soft.     Tenderness: There is no abdominal tenderness.  Musculoskeletal:     Cervical back: Normal range of motion and neck supple.     Right lower leg: No edema.     Left lower leg: No edema.   Lymphadenopathy:     Cervical: No cervical adenopathy.  Skin:    General: Skin is warm and dry.  Neurological:     Mental Status: She is alert and oriented to person, place, and time.     Deep Tendon Reflexes: Reflexes are normal and symmetric.     Reflex Scores:      Brachioradialis reflexes are 2+ on the  right side and 2+ on the left side.      Patellar reflexes are 2+ on the right side and 2+ on the left side. Psychiatric:        Attention and Perception: Attention normal.        Mood and Affect: Mood normal.        Speech: Speech normal.        Behavior: Behavior normal. Behavior is cooperative.        Thought Content: Thought content normal.    Results for orders placed or performed in visit on 01/30/23  Microalbumin, Urine Waived   Collection Time: 01/30/23 11:43 AM  Result Value Ref Range   Microalb, Ur Waived 80 (H) 0 - 19 mg/L   Creatinine, Urine Waived 100 10 - 300 mg/dL   Microalb/Creat Ratio 30-300 (H) <30 mg/g  Bayer DCA Hb A1c Waived   Collection Time: 01/30/23 11:43 AM  Result Value Ref Range   HB A1C (BAYER DCA - WAIVED) 6.5 (H) 4.8 - 5.6 %  CBC with Differential/Platelet   Collection Time: 01/30/23 11:46 AM  Result Value Ref Range   WBC 4.2 3.4 - 10.8 x10E3/uL   RBC 4.49 3.77 - 5.28 x10E6/uL   Hemoglobin 13.1 11.1 - 15.9 g/dL   Hematocrit 28.4 13.2 - 46.6 %   MCV 89 79 - 97 fL   MCH 29.2 26.6 - 33.0 pg   MCHC 32.8 31.5 - 35.7 g/dL   RDW 44.0 10.2 - 72.5 %   Platelets 191 150 - 450 x10E3/uL   Neutrophils 64 Not Estab. %   Lymphs 19 Not Estab. %   Monocytes 13 Not Estab. %   Eos 3 Not Estab. %   Basos 1 Not Estab. %   Neutrophils Absolute 2.7 1.4 - 7.0 x10E3/uL   Lymphocytes Absolute 0.8 0.7 - 3.1 x10E3/uL   Monocytes Absolute 0.6 0.1 - 0.9 x10E3/uL   EOS (ABSOLUTE) 0.1 0.0 - 0.4 x10E3/uL   Basophils Absolute 0.0 0.0 - 0.2 x10E3/uL   Immature Granulocytes 0 Not Estab. %   Immature Grans (Abs) 0.0 0.0 - 0.1 x10E3/uL  Comprehensive metabolic panel    Collection Time: 01/30/23 11:46 AM  Result Value Ref Range   Glucose 124 (H) 70 - 99 mg/dL   BUN 13 6 - 24 mg/dL   Creatinine, Ser 3.66 (H) 0.57 - 1.00 mg/dL   eGFR 50 (L) >44 IH/KVQ/2.59   BUN/Creatinine Ratio 10 9 - 23   Sodium 144 134 - 144 mmol/L   Potassium 3.9 3.5 - 5.2 mmol/L   Chloride 104 96 - 106 mmol/L   CO2 23 20 - 29 mmol/L   Calcium 9.4 8.7 - 10.2 mg/dL   Total Protein 7.0 6.0 - 8.5 g/dL   Albumin 4.5 3.8 - 4.9 g/dL   Globulin, Total 2.5 1.5 - 4.5 g/dL   Bilirubin Total 0.7 0.0 - 1.2 mg/dL   Alkaline Phosphatase 80 44 - 121 IU/L   AST 19 0 - 40 IU/L   ALT 15 0 - 32 IU/L  Lipid Panel w/o Chol/HDL Ratio   Collection Time: 01/30/23 11:46 AM  Result Value Ref Range   Cholesterol, Total 193 100 - 199 mg/dL   Triglycerides 563 0 - 149 mg/dL   HDL 87 >87 mg/dL   VLDL Cholesterol Cal 19 5 - 40 mg/dL   LDL Chol Calc (NIH) 87 0 - 99 mg/dL  TSH   Collection Time: 01/30/23 11:46 AM  Result Value Ref Range  TSH 3.230 0.450 - 4.500 uIU/mL  VITAMIN D 25 Hydroxy (Vit-D Deficiency, Fractures)   Collection Time: 01/30/23 11:46 AM  Result Value Ref Range   Vit D, 25-Hydroxy 19.4 (L) 30.0 - 100.0 ng/mL  T4, free   Collection Time: 01/30/23 11:46 AM  Result Value Ref Range   Free T4 1.01 0.82 - 1.77 ng/dL  Thyroid peroxidase antibody   Collection Time: 01/30/23 11:46 AM  Result Value Ref Range   Thyroperoxidase Ab SerPl-aCnc 126 (H) 0 - 34 IU/mL  Ferritin   Collection Time: 01/30/23 11:46 AM  Result Value Ref Range   Ferritin 101 15 - 150 ng/mL  Iron   Collection Time: 01/30/23 11:46 AM  Result Value Ref Range   Iron 38 27 - 159 ug/dL      Assessment & Plan:   Problem List Items Addressed This Visit       Cardiovascular and Mediastinum   Essential hypertension   Chronic, uncontrolled. Has not been taking medication since December 2023, even after sending scripts last visit.  Reports she wanted to control on her own, but concern there may still be cocaine use.   Will send scripts to [facility] to see if more affordable for patient + place referral to [facility] pharmacist and nurse case manager for assist.  Discussed at length with her.  Recommended she completely sustain from cocaine use and educated her on the high risks if continues use: stroke or MI.  Recommend no ibuprofen use at home.  Recommend she monitor BP at least a few mornings a week at home and document.  DASH diet at home, reports eating a lot of salt.  Labs today: up to date.  Return in 6 weeks. - Would like to donate plasma, but reported she can donate only if taking medications consistently and BP <140/90       Relevant Medications   hydrochlorothiazide (HYDRODIURIL) 25 MG tablet   valsartan (DIOVAN) 160 MG tablet   Other Relevant Orders   AMB Referral [facility] Care Management     Endocrine   Hashimoto thyroiditis   Ongoing, have recommended we start medication and educated on Hashimoto's.  She reports this runs in family.  Start Levothyroxine 25 MCG daily, educated her on this and disease process.  Repeat labs in 6 weeks.      Relevant Medications   levothyroxine (SYNTHROID) 25 MCG tablet     Genitourinary   CKD (chronic kidney disease) stage 3, GFR 30-59 ml/min (HCC) - Primary   Ongoing.  Recommend she take BP medications daily as ordered and discussed with her risk to kidneys if she does not.  Also discussed importance of complete cessation of cocaine use, as concerned she continues to use.  Ensure plenty of water intake daily and avoid Ibuprofen products.  Labs up to date.      Relevant Orders   AMB Referral [facility] Care Management     Other   Cocaine abuse (HCC)   Have recommended complete cessation and discussed at length risks with HTN and CKD, to include stroke or MI if continues use of this and does not take her medications as ordered.  Refuses rehab or group therapy.  She reports not using.      Relevant Orders   AMB Referral [facility] Care Management   Elevated hemoglobin  A1c measurement   Recent A1c with trend up to 6.5% -- will recheck in 3 months and if remains elevated in this range or higher will change to diabetes.  Recommend heavy focus on healthy diet changes at home.  Start medication as needed.        Follow up plan: Return in about 6 weeks (around 04/17/2023) for HTN AND THYROID.

## 2023-03-06 NOTE — Assessment & Plan Note (Signed)
 Chronic, uncontrolled. Has not been taking medication since December 2023, even after sending scripts last visit.  Reports she wanted to control on her own, but concern there may still be cocaine use.  Will send scripts to [facility] to see if more affordable for patient + place referral to [facility] pharmacist and nurse case manager for assist.  Discussed at length with her.  Recommended she completely sustain from cocaine use and educated her on the high risks if continues use: stroke or MI.  Recommend no ibuprofen use at home.  Recommend she monitor BP at least a few mornings a week at home and document.  DASH diet at home, reports eating a lot of salt.  Labs today: up to date.  Return in 6 weeks. - Would like to donate plasma, but reported she can donate only if taking medications consistently and BP <140/90

## 2023-03-06 NOTE — Assessment & Plan Note (Signed)
 Have recommended complete cessation and discussed at length risks with HTN and CKD, to include stroke or MI if continues use of this and does not take her medications as ordered.  Refuses rehab or group therapy.  She reports not using.

## 2023-03-06 NOTE — Telephone Encounter (Signed)
 Called and left message for patient. Need her to clarify what prescriptions she is referring to and if all then she should be advised that she should contact [facility] and ask they transfer the prescription to the correct pharmacy. As these are not controlled medications this would be patients best course of action as no provider intervention required.

## 2023-03-08 ENCOUNTER — Telehealth: Payer: Self-pay

## 2023-03-08 NOTE — Progress Notes (Signed)
  Medicaid Managed Care   Unsuccessful Attempt Note   03/08/2023 Name: Paula Mullins MRN: [id number] DOB: 29-Aug-1970  Referred by: Marjie Skiff, NP Reason for referral : High Risk Managed Medicaid (MM Dual Referral for [facility] & [facility]. )   An unsuccessful telephone outreach was attempted today. The patient was referred to the case management team for assistance with care management and care coordination.    Follow Up Plan: A HIPAA compliant phone message was left for the patient providing contact information and requesting a return call.    [facility]  [facility], [facility] Health Care Management Assistant Direct Dial: [phone]  Fax: [phone]

## 2023-03-15 ENCOUNTER — Telehealth: Payer: Self-pay

## 2023-03-15 NOTE — Progress Notes (Signed)
 Complex Care Management Note  Care Guide Note 03/15/2023 Name: Paula Mullins MRN: [id number] DOB: [birth year]  Paula Mullins is a 53 y.o. year old female who sees [place], Corrie Dandy T, NP for primary care. I reached out to [name] by phone today to offer complex care management services.  Ms. Delauder was given information about Complex Care Management services today including:   The Complex Care Management services include support from the care team which includes your Nurse Care Manager, Clinical Social Worker, or Pharmacist.  The Complex Care Management team is here to help remove barriers to the health concerns and goals most important to you. Complex Care Management services are voluntary, and the patient may decline or stop services at any time by request to their care team member.   Complex Care Management Consent Status: Patient wishes to consider information provided and/or speak with a member of the care team before deciding to participate in complex care management services.   Follow up plan:  Telephone appointment with complex care management team member scheduled for:  03/24/23 & 03/30/23.    Encounter Outcome:  Patient Scheduled  [facility]  [place], [facility] Health Care Management Assistant Direct Dial: [phone]  Fax: [phone]

## 2023-03-15 NOTE — Progress Notes (Signed)
 Care Guide Pharmacy Note  03/15/2023 Name: Paula Mullins MRN: [id number] DOB: 1970/09/20  Referred By: Marjie Skiff, NP Reason for referral: Care Coordination (Outreach to schedule with Pharm d )   Paula Mullins is a 53 y.o. year old female who is a primary care patient of Cannady, Dorie Rank, NP.  [name] was referred to the pharmacist for assistance related to: HTN and CKD Stage 3  Successful contact was made with the patient to discuss pharmacy services including being ready for the pharmacist to call at least 5 minutes before the scheduled appointment time and to have medication bottles and any blood pressure readings ready for review. The patient agreed to meet with the pharmacist via telephone visit on (date/time).03/24/2023  Penne Lash , RMA       [facility], [facility] Guide  Direct Dial: [phone]  Website: [email or web address]

## 2023-03-17 ENCOUNTER — Other Ambulatory Visit: Payer: Self-pay

## 2023-03-24 ENCOUNTER — Other Ambulatory Visit: Payer: Self-pay

## 2023-03-24 NOTE — Progress Notes (Unsigned)
   03/24/2023  Patient ID: Paula Mullins, female   DOB: July 24, 1970, 53 y.o.   MRN: [id number]  Unsuccessful outreach attempt for patient's scheduled telephone visit to assist with affordability of and adherence to medication regimen.  Called and left HIPAA compliant voicemail with my direct phone number x2.  I am also sending a MyChart message to see if we can find a time to reschedule that may work better for the patient.    Lenna Gilford, PharmD, DPLA

## 2023-03-30 ENCOUNTER — Other Ambulatory Visit: Payer: Self-pay | Admitting: *Deleted

## 2023-03-30 NOTE — Patient Instructions (Signed)
 Visit Information  Ms. Paula Mullins  - as a part of your Medicaid benefit, you are eligible for care management and care coordination services at no cost or copay. I was unable to reach you by phone today but would be happy to help you with your health related needs. Please feel free to call me @ [phone].   A member of the Managed Medicaid care management team will reach out to you again over the next 7 days.   Estanislado Emms RN, BSN [place]  [facility] RN Care Manager [phone]

## 2023-03-30 NOTE — Patient Outreach (Signed)
  Medicaid Managed Care   Unsuccessful Attempt Note   03/30/2023 Name: Paula Mullins MRN: [id number] DOB: 04-29-1970  Referred by: Marjie Skiff, NP Reason for referral : High Risk Managed Medicaid (Unsuccessful RNCM initial telephone outreach)   An unsuccessful telephone outreach was attempted today. The patient was referred to the case management team for assistance with care management and care coordination.    Follow Up Plan: A HIPAA compliant phone message was left for the patient providing contact information and requesting a return call. and The Managed Medicaid care management team will reach out to the patient again over the next 7 days.    Estanislado Emms RN, BSN [place]  [facility] RN Care Manager [phone]

## 2023-04-04 ENCOUNTER — Other Ambulatory Visit: Payer: Self-pay

## 2023-04-04 NOTE — Progress Notes (Unsigned)
   04/04/2023  Patient ID: Paula Mullins, female   DOB: 11/13/1970, 53 y.o.   MRN: [id number]  Outreach attempt to assist with access/adherence/affordability of medications was unsuccessful.  HIPAA compliant voicemail left with my direct phone number and sending MyChart message to attempt to schedule telephone visit.  Lenna Gilford, PharmD, DPLA

## 2023-04-05 ENCOUNTER — Telehealth: Payer: Self-pay

## 2023-04-05 NOTE — Telephone Encounter (Signed)
-----   Message from Nurse Shawna Orleans R sent at 03/30/2023  3:50 PM EDT ----- Paula Mullins, I had an unsuccessful initial outreach with this patient. She answered and told me to take her name off of my calling list before I could even introduce myself. I used the spoofing code, it should have shown up as [place]. I attempted to reconnect and she did not answer. Please attempt to reschedule. Thank you

## 2023-04-06 NOTE — Progress Notes (Unsigned)
 Complex Care Management Care Guide Note  04/06/2023 Name: Paula Mullins MRN: [id number] DOB: 1970-10-11  Paula Mullins is a 53 y.o. year old female who is a primary care patient of Marjie Skiff, NP and is actively engaged with the care management team. I reached out to [name] by phone today to assist with re-scheduling  with the RN Case Manager.  Follow up plan: Unsuccessful telephone outreach attempt made. A HIPAA compliant phone message was left for the patient providing contact information and requesting a return call.  Penne Lash , RMA     [facility]  [facility], [facility] Guide  Direct Dial: [phone]  Website: [email or web address]

## 2023-04-18 ENCOUNTER — Ambulatory Visit: Admitting: Nurse Practitioner

## 2023-04-18 DIAGNOSIS — E063 Autoimmune thyroiditis: Secondary | ICD-10-CM

## 2023-04-18 DIAGNOSIS — N1831 Chronic kidney disease, stage 3a: Secondary | ICD-10-CM

## 2023-04-18 DIAGNOSIS — R7309 Other abnormal glucose: Secondary | ICD-10-CM

## 2023-04-18 DIAGNOSIS — F319 Bipolar disorder, unspecified: Secondary | ICD-10-CM

## 2023-04-18 DIAGNOSIS — I10 Essential (primary) hypertension: Secondary | ICD-10-CM

## 2023-04-18 DIAGNOSIS — F1410 Cocaine abuse, uncomplicated: Secondary | ICD-10-CM

## 2023-09-21 IMAGING — US US PELVIS COMPLETE WITH TRANSVAGINAL
1 series · 13 of 25 positions shown · non-contrast
Comparison: None

CLINICAL DATA: Abnormal uterine bleeding.  Postmenopausal bleeding

EXAM:
TRANSABDOMINAL AND TRANSVAGINAL ULTRASOUND OF PELVIS
TECHNIQUE: Both transabdominal and transvaginal ultrasound examinations of the
pelvis were performed. Transabdominal technique was performed for
global imaging of the pelvis including uterus, ovaries, adnexal
regions, and pelvic cul-de-sac. It was necessary to proceed with
endovaginal exam following the transabdominal exam to visualize the
ovaries and endometrium.

[Series 1: us pelvic complete with transvaginal · 13 of 96 slices shown]
[im 1/96]
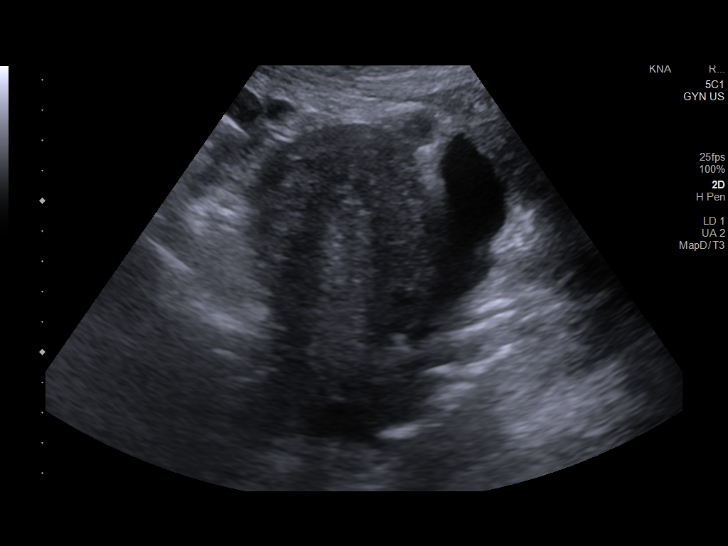
[im 8/96]
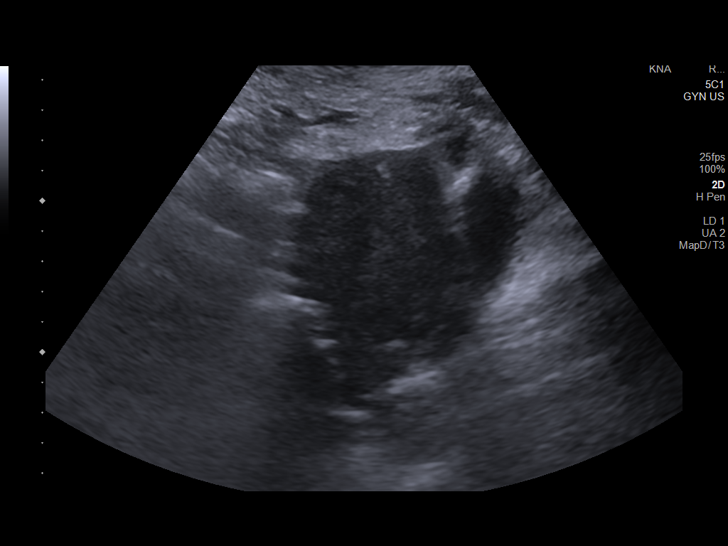
[im 16/96]
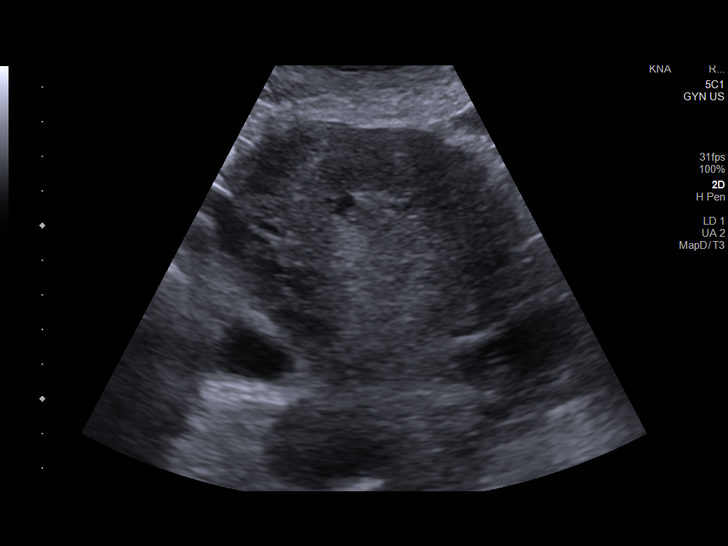
[im 24/96]
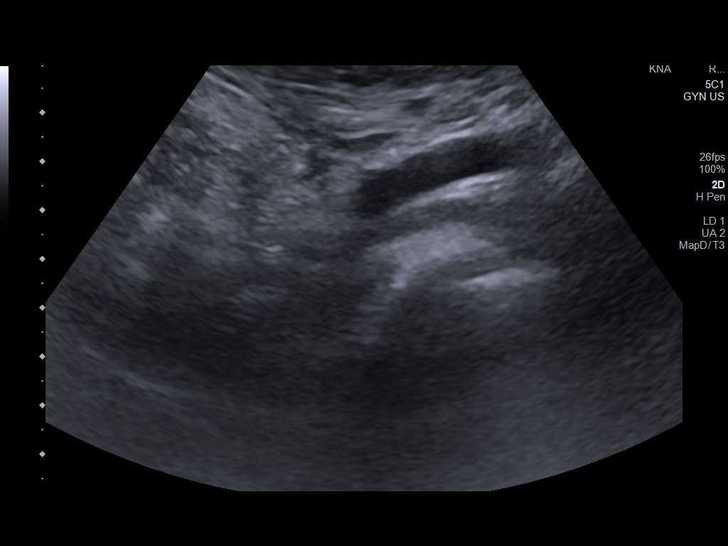
[im 32/96]
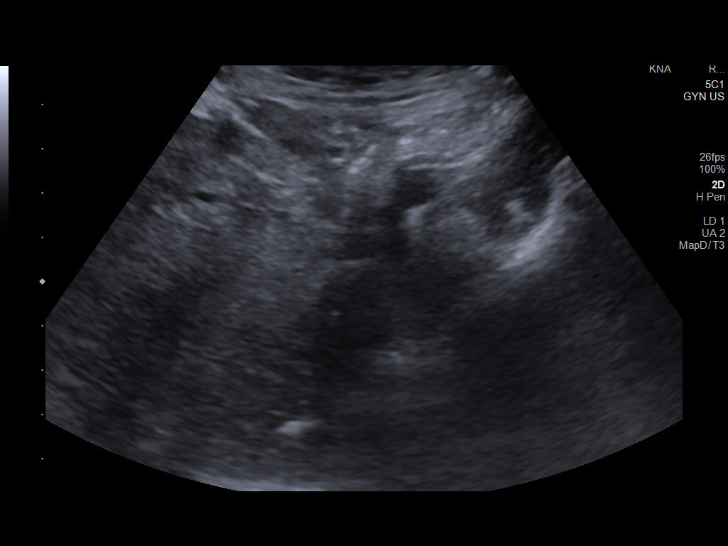
[im 40/96]
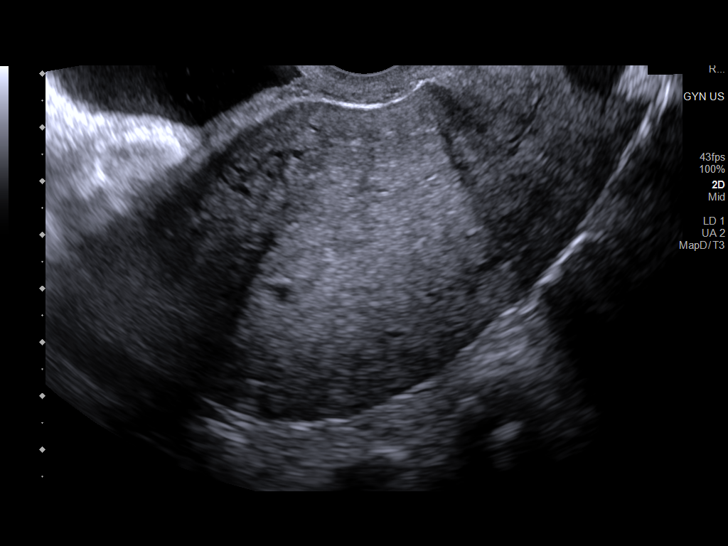
[im 48/96]
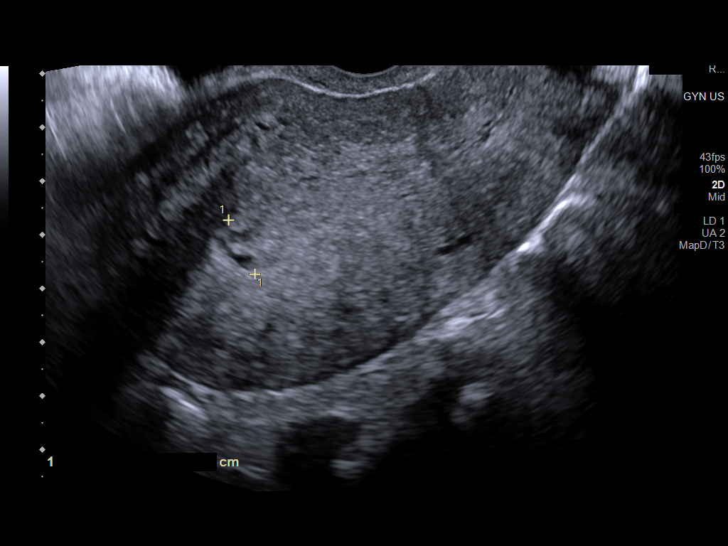
[im 56/96]
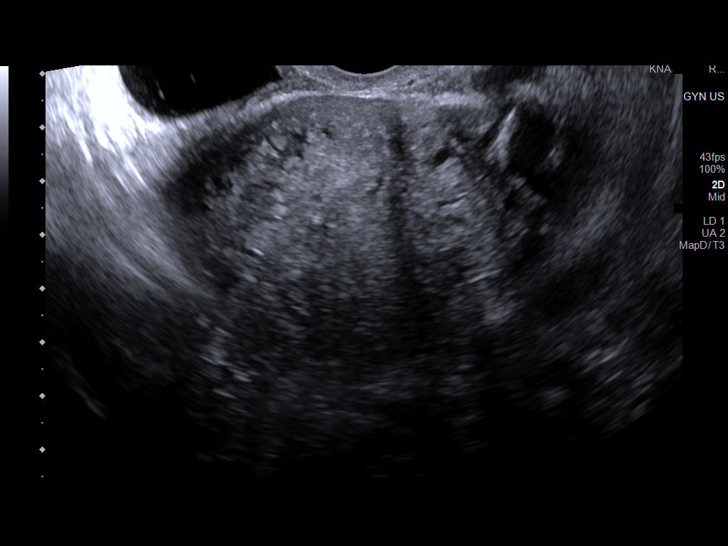
[im 64/96]
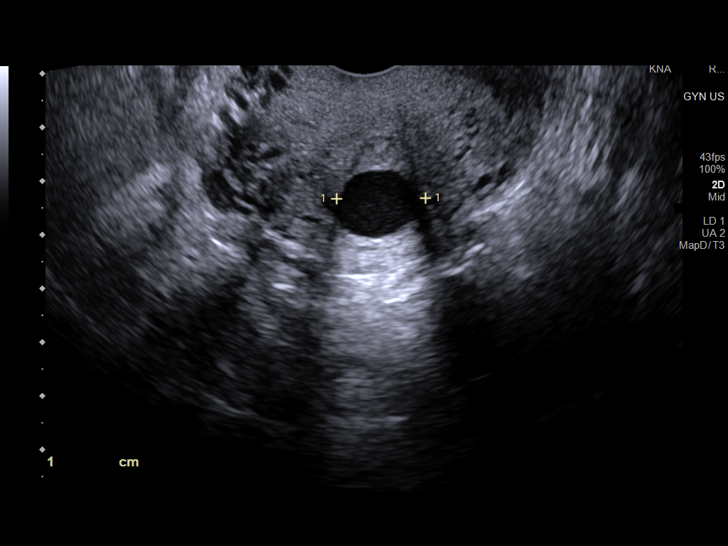
[im 72/96]
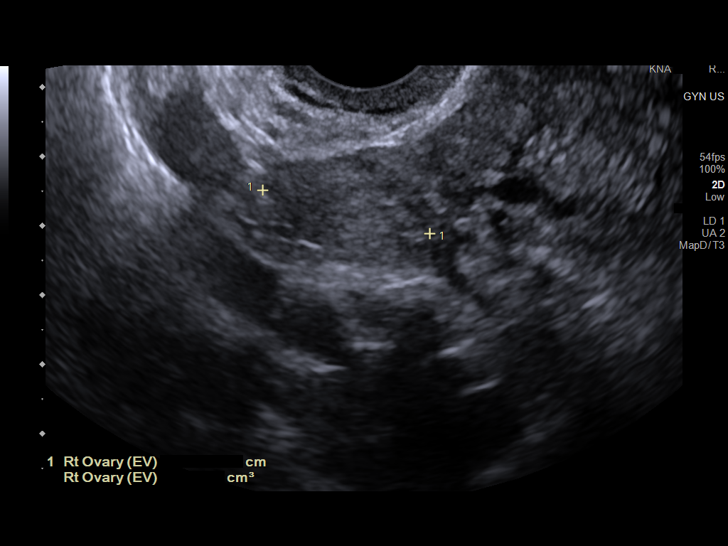
[im 80/96]
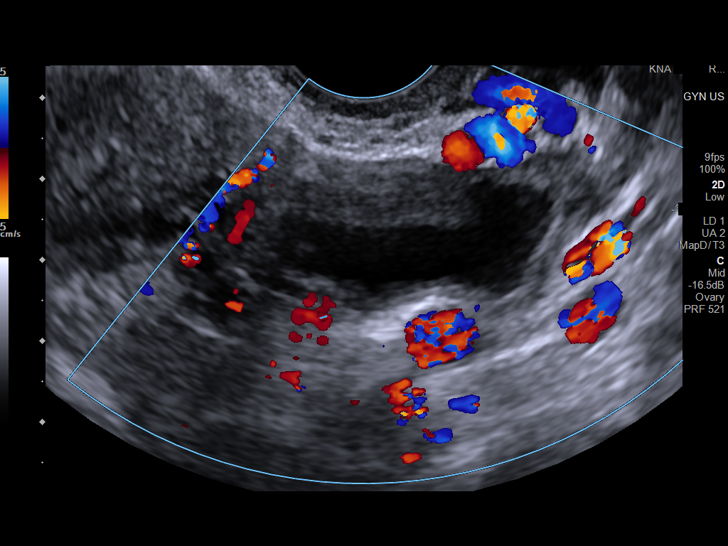
[im 88/96]
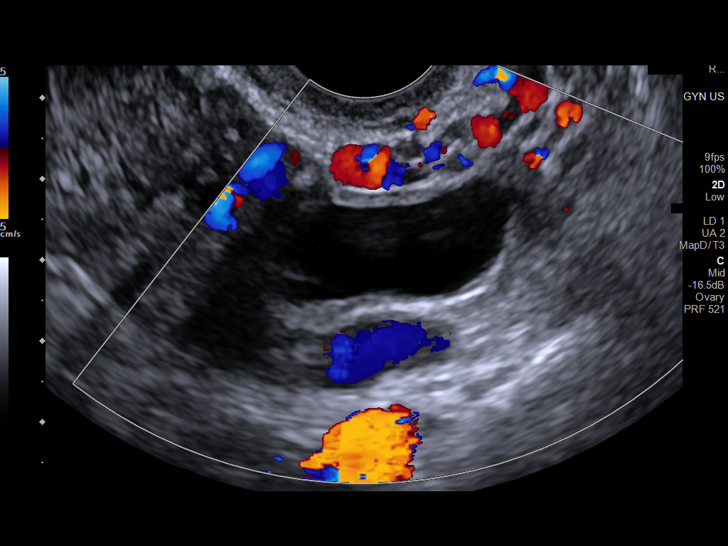
[im 96/96]
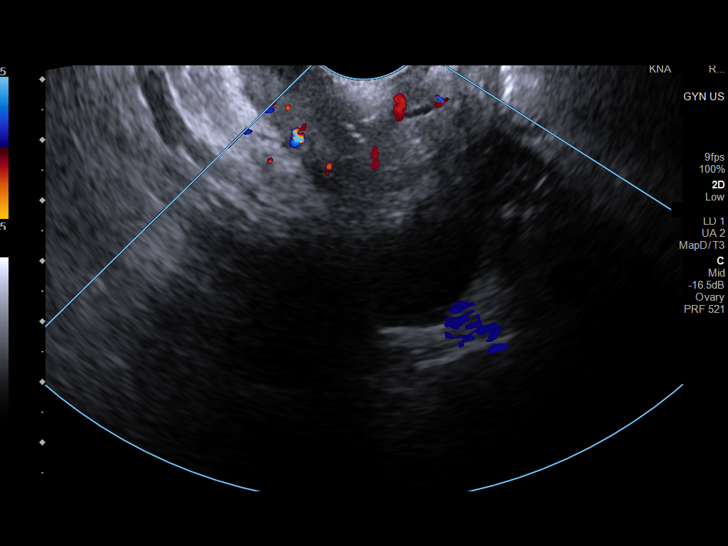

[13 of 25 positions shown; findings below may reference images not displayed]

FINDINGS: Uterus

Measurements: 10.6 x 6.4 x 9.1 cm = volume: 323 mL. No fibroids or
other mass visualized. Multiple nabothian cysts noted at the level
of the cervix.

Endometrium

Thickness: 20 mm. Heterogeneous appearance of the endometrium. No
well-defined mass.

Right ovary

Measurements: 2.7 x 1.8 x 2.5 cm = volume: 6 mL. Normal
appearance/no adnexal mass.

Left ovary

Measurements: 3.4 x 2.0 x 3.7 cm = volume: 13 mL. Elongated cystic
area appears partially within the left ovary measuring 3.8 x 1.0 x
2.6 cm. No solid or nodular component. No internal vascularity.

Other findings

Trace free fluid within the cul-de-sac.
IMPRESSION: 1. Abnormally thickened endometrium measuring 20 mm. In the setting
of post-menopausal bleeding, endometrial sampling is indicated to
exclude carcinoma. If results are benign, sonohysterogram should be
considered for focal lesion work-up. (Ref: Radiological Reasoning:
Algorithmic Workup of Abnormal Vaginal Bleeding with Endovaginal
Sonography and Sonohysterography. AJR 3881; 191:[id number])
2. Elongated cystic appearing structure within or adjacent to the
left ovary measuring up to 3.8 cm. This could potentially represent
a dilated fallopian tube versus a involuting cyst. Recommend
followup US in 3-6 months. Note: This recommendation does not apply
to premenarchal patients or to those with increased risk (genetic,
family history, elevated tumor markers or other high-risk factors)
of ovarian cancer. Reference: Radiology [DATE]):[id number].

## 2024-02-03 NOTE — Patient Instructions (Incomplete)
 Chronic Kidney Disease: Eating Plan Chronic kidney disease (CKD) is when your kidneys aren't working well. They can't remove waste, fluids, and other substances from your blood. When these substances build up, they can worsen kidney damage and affect your health. Eating certain foods can lead to a buildup of these substances. Changing your diet can help prevent more kidney damage. Diet changes may also delay dialysis or even keep you from needing it. What nutrients should I limit? Work with your health care team and an expert in healthy eating (dietitian) to make a meal plan that's right for you. Foods you can eat and foods you should limit or avoid will depend on: The stage of your kidney disease. Any other conditions you have. The items listed below are not a complete list. Talk with a dietitian to learn what's best for you. Potassium Potassium affects how well your heart beats. Too much potassium in your blood can cause an irregular heartbeat or even a heart attack. You may need to limit foods that are high in potassium, such as: Liquid milk and soy milk. Salt substitutes that contain potassium. Fruits like: Bananas. Apricots. Melon. Prunes and raisins. Kiwi. Nectarines and oranges. Vegetables, such as: Potatoes, sweet potatoes, and yams. Tomatoes. Leafy greens. Beets. Avocado. Pumpkin and winter squash. Beans, like lima beans. Nuts. Phosphorus Phosphorus is a mineral found in your bones. You need a balance between calcium and phosphorus to build and maintain healthy bones.  Too much added phosphorus from the foods you eat can pull calcium from your bones. Losing calcium can make your bones weak and more likely to break. Too much phosphorus can also make your skin itch. You may need to limit foods that are high in phosphorus or that have added phosphorus, such as: Liquid milk and dairy products. Dark-colored sodas or soft drinks. Bran cereals and oatmeal. Protein  Protein  helps your body make and keep muscle. Protein also helps to repair your body's cells and tissues.  One of the natural breakdown products of protein is a waste product called urea. When your kidneys aren't working well, they can't remove waste like urea. Reducing protein in your diet can help keep urea from building up in your blood. Depending on your stage of kidney disease, you may need to eat smaller portions of foods that are high in protein. Sources of animal protein include: Meat (all types). Fish and seafood. Poultry. Eggs. Dairy. Other protein foods include: Beans and legumes. Nuts and nut butter. Soy, like tofu.  Sodium Salt (sodium) helps to keep a healthy balance of fluids in your body. Too much salt can increase your blood pressure, which can harm your heart and lungs. Extra salt can also cause your body to keep too much fluid, making your kidneys work harder. You may need to limit or avoid foods that are high in salt, such as: Salt seasonings. Soy and teriyaki sauce. Meats that are: Packaged. Precooked. Cured. Processed. Salted crackers and snack foods. Fast food. Canned soups and foods. Pickled foods. Boxed mixes or ready-to-eat boxed meals and side dishes. Bottled dressings, sauces, and marinades. Talk with your dietitian about how much potassium, phosphorus, protein, and salt you may have each day. What are tips for following this plan? Reading food labels  Check the amount of salt in foods. Limit foods that have salt listed among the first five ingredients. Try to eat low-salt foods. Check the ingredient list for added phosphorus or potassium. "Phos" in an ingredient is a sign that  phosphorus has been added. Do not buy foods that are calcium-enriched or that have calcium added to them (fortified). Buy canned vegetables and beans that say "no salt added." Rinse them before eating. Lifestyle Limit the amount of protein you eat from animal sources each day. Focus on  protein from plant sources, like tofu and dried beans, peas, and lentils. Do not add salt to food when cooking or before eating. Do not eat star fruit. It can be toxic for people with kidney problems. Talk with your health care provider before taking any vitamin or mineral supplements. If told by your provider: Track how much liquid you have so you can avoid drinking too much. Try to eat foods that are made mostly from water, like gelatin, ice cream, soups, and juicy fruits and vegetables. If you have diabetes and chronic kidney disease: If you have diabetes and CKD, you need to keep your blood sugar (glucose) in the target range recommended by your provider. Follow your diabetes management plan. This may include: Checking your blood glucose regularly. Taking medicines by mouth, or taking insulin, or both. Exercising for at least 30 minutes on 5 or more days each week, or as told by your provider. Tracking how many servings of carbohydrates you eat at each meal. Not using orange juice to treat low blood sugars. Instead, use apple juice, cranberry juice, or clear soda. You may be given guidelines on what foods and nutrients you may eat, and how much you can have each day. This depends on your stage of kidney disease and whether you have high blood pressure. Follow the meal plan your dietitian gives you. Where to find more information [employer] of Diabetes and Digestive and Kidney Diseases: [employer]si National Kidney Foundation: kidney.org This information is not intended to replace advice given to you by your health care provider. Make sure you discuss any questions you have with your health care provider. Document Revised: 08/02/2022 Document Reviewed: 08/02/2022 Elsevier Patient Education  2024 [employer].

## 2024-02-05 ENCOUNTER — Ambulatory Visit: Admitting: Nurse Practitioner

## 2024-02-15 IMAGING — DX DG MANDIBLE 1-3V
3 series · 3 of 3 positions shown · non-contrast
Comparison: None.

CLINICAL DATA: Evaluate location of tongue stud

EXAM:
MANDIBLE - 1-3 VIEW

[mandible pa]
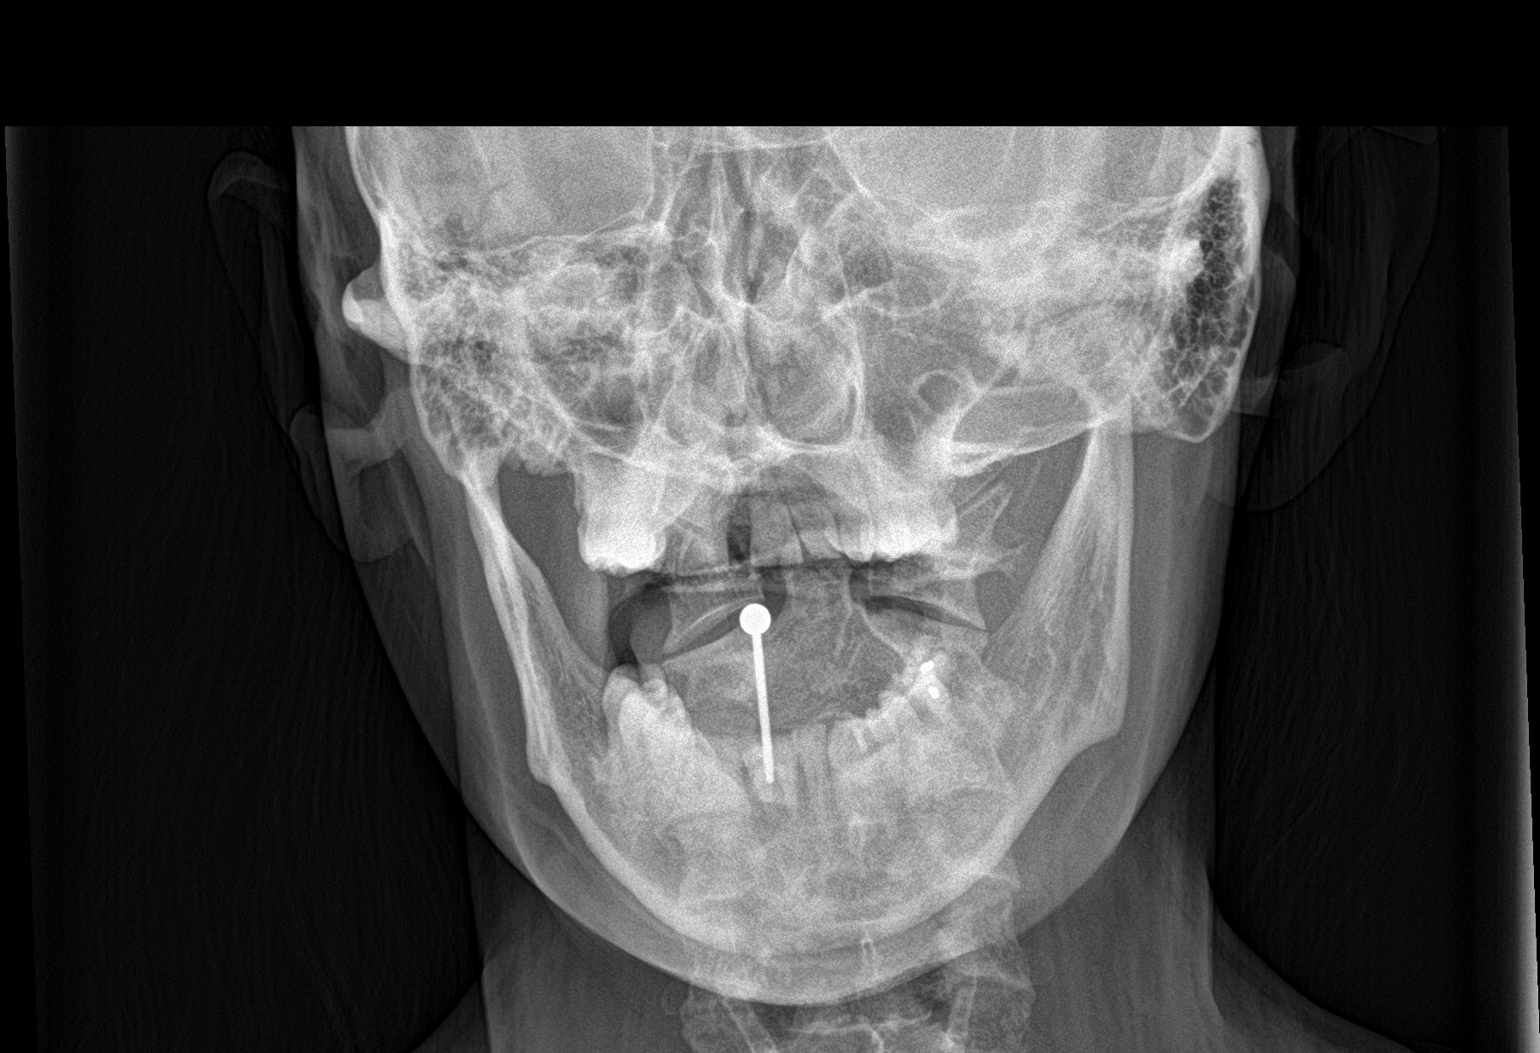

[mandible lat (1 of 2)]
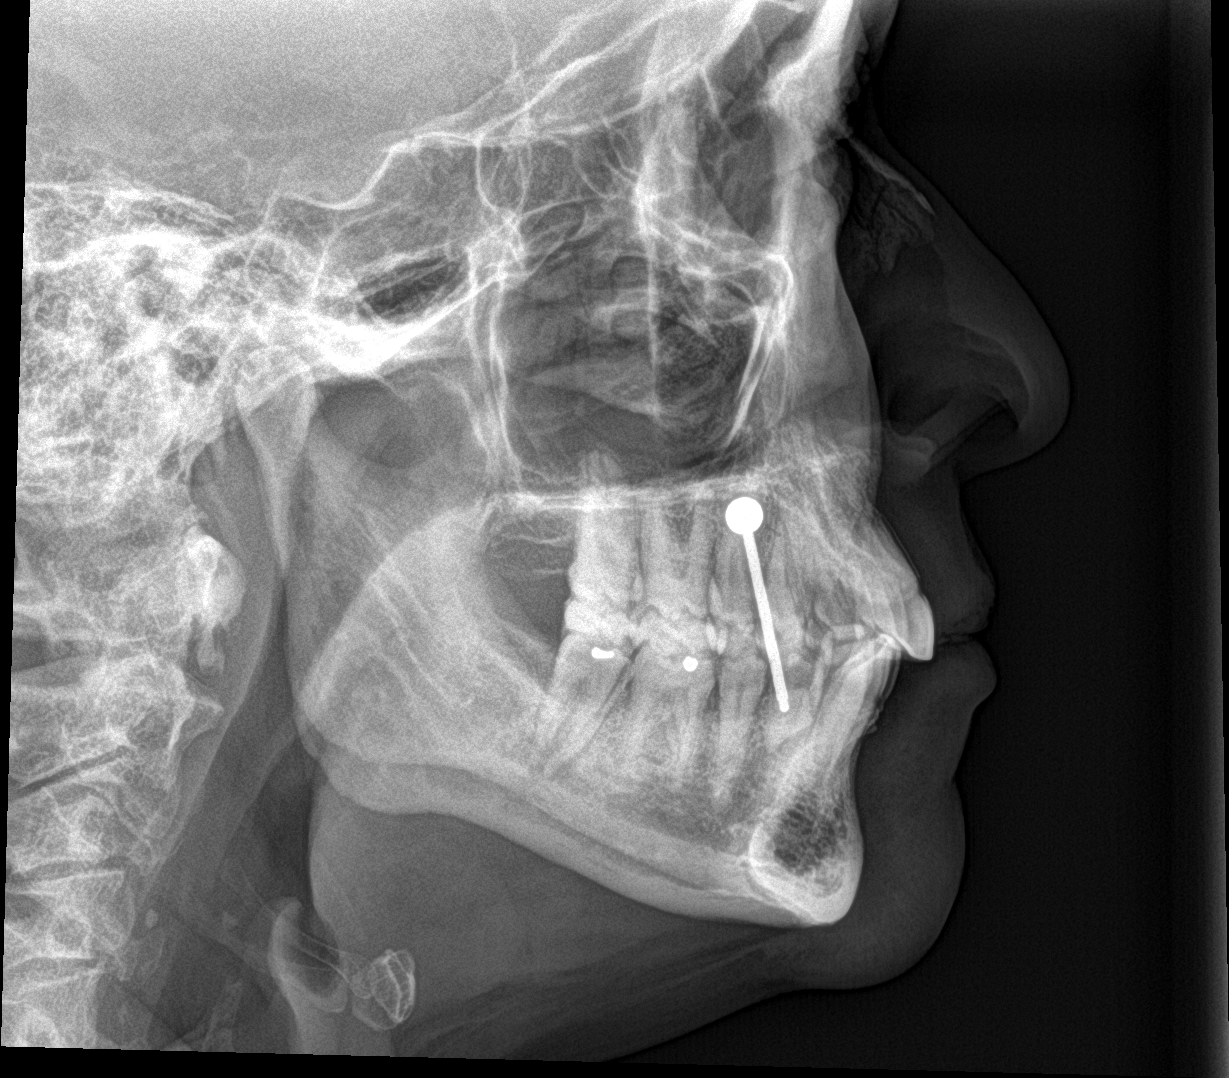

[mandible lat (2 of 2)]
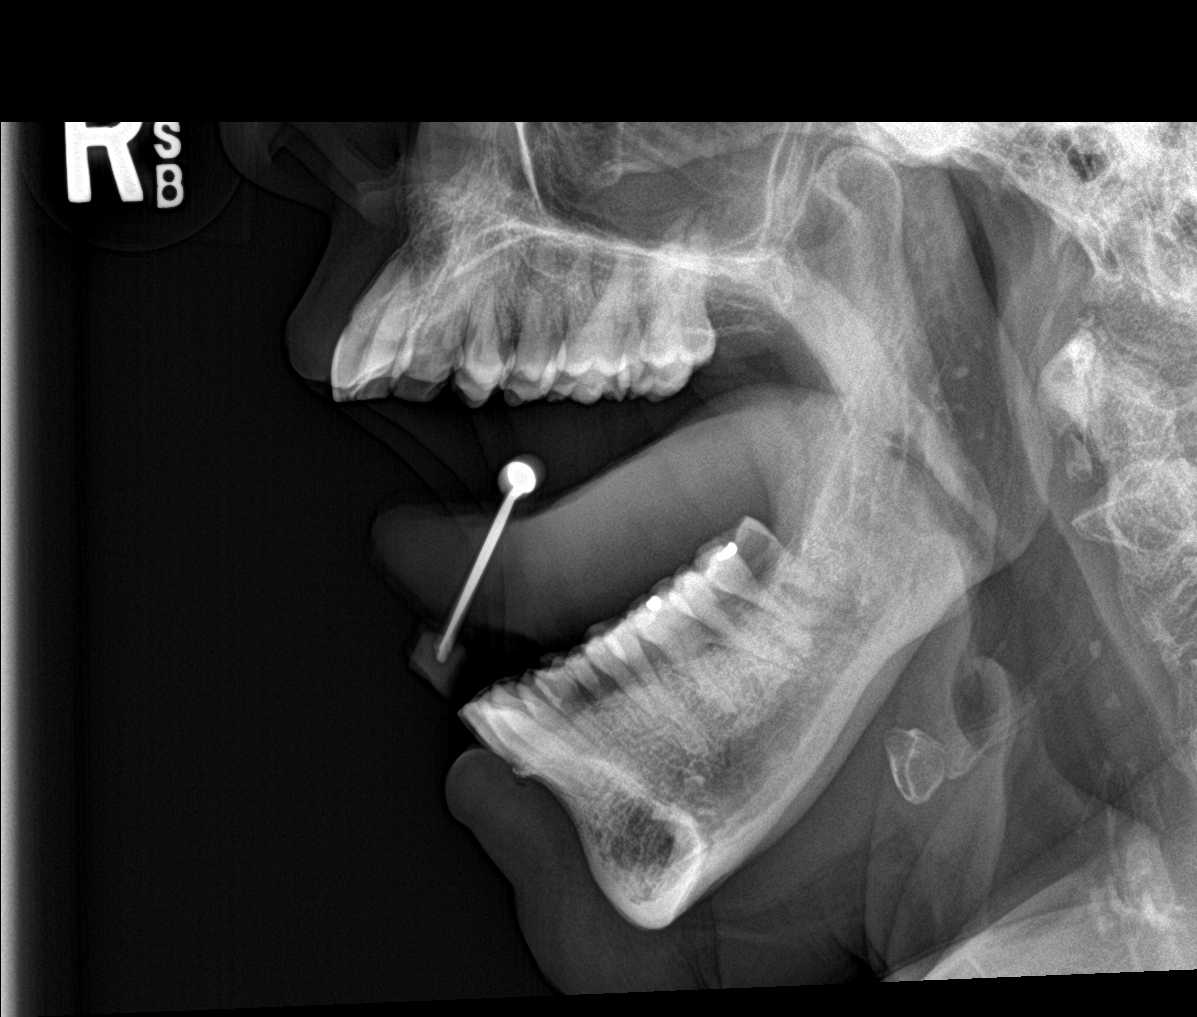

[3 of 3 positions shown; findings below may reference images not displayed]

FINDINGS: No fracture is seen. There is a metallic foreign body tongue stud in
the anterior aspect of the tongue. There is metallic ball in the
superior end of the stud and somewhat square-shaped structure in the
inferior aspect of the stud.
IMPRESSION: No fracture is seen in the mandible.  Tongue stud is noted in place.
